# Patient Record
Sex: Female | Born: 1955 | Race: Black or African American | Hispanic: No | Marital: Married | Smoking: Former smoker
Health system: Southern US, Community
[De-identification: ages and names within clinical notes are randomized; demographics above are authoritative.]

## PROBLEM LIST (undated history)

## (undated) DIAGNOSIS — E7800 Pure hypercholesterolemia, unspecified: Secondary | ICD-10-CM

## (undated) DIAGNOSIS — M1990 Unspecified osteoarthritis, unspecified site: Secondary | ICD-10-CM

## (undated) DIAGNOSIS — E119 Type 2 diabetes mellitus without complications: Secondary | ICD-10-CM

## (undated) DIAGNOSIS — E039 Hypothyroidism, unspecified: Secondary | ICD-10-CM

## (undated) DIAGNOSIS — C801 Malignant (primary) neoplasm, unspecified: Secondary | ICD-10-CM

## (undated) DIAGNOSIS — I10 Essential (primary) hypertension: Secondary | ICD-10-CM

## (undated) DIAGNOSIS — R112 Nausea with vomiting, unspecified: Secondary | ICD-10-CM

## (undated) HISTORY — PX: DILATION AND CURETTAGE OF UTERUS: SHX78

## (undated) HISTORY — PX: OOPHORECTOMY: SHX86

---

## 1988-06-26 HISTORY — PX: DILATION AND CURETTAGE OF UTERUS: SHX78

## 1997-10-05 ENCOUNTER — Other Ambulatory Visit: Admission: RE | Admit: 1997-10-05 | Discharge: 1997-10-05 | Payer: Self-pay | Admitting: Internal Medicine

## 1997-10-12 ENCOUNTER — Other Ambulatory Visit: Admission: RE | Admit: 1997-10-12 | Discharge: 1997-10-12 | Payer: Self-pay | Admitting: Internal Medicine

## 1997-10-28 ENCOUNTER — Other Ambulatory Visit: Admission: RE | Admit: 1997-10-28 | Discharge: 1997-10-28 | Payer: Self-pay | Admitting: Obstetrics & Gynecology

## 1998-11-10 ENCOUNTER — Other Ambulatory Visit: Admission: RE | Admit: 1998-11-10 | Discharge: 1998-11-10 | Payer: Self-pay | Admitting: Obstetrics & Gynecology

## 1998-11-12 ENCOUNTER — Ambulatory Visit (HOSPITAL_COMMUNITY): Admission: RE | Admit: 1998-11-12 | Discharge: 1998-11-12 | Payer: Self-pay | Admitting: Internal Medicine

## 1998-11-12 ENCOUNTER — Encounter: Payer: Self-pay | Admitting: Internal Medicine

## 2000-02-20 ENCOUNTER — Other Ambulatory Visit: Admission: RE | Admit: 2000-02-20 | Discharge: 2000-02-20 | Payer: Self-pay | Admitting: Obstetrics & Gynecology

## 2000-12-11 ENCOUNTER — Encounter: Payer: Self-pay | Admitting: Internal Medicine

## 2000-12-11 ENCOUNTER — Ambulatory Visit (HOSPITAL_COMMUNITY): Admission: RE | Admit: 2000-12-11 | Discharge: 2000-12-11 | Payer: Self-pay | Admitting: Internal Medicine

## 2001-07-03 ENCOUNTER — Other Ambulatory Visit: Admission: RE | Admit: 2001-07-03 | Discharge: 2001-07-03 | Payer: Self-pay | Admitting: Obstetrics and Gynecology

## 2002-03-10 ENCOUNTER — Encounter: Payer: Self-pay | Admitting: Internal Medicine

## 2002-03-10 ENCOUNTER — Ambulatory Visit (HOSPITAL_COMMUNITY): Admission: RE | Admit: 2002-03-10 | Discharge: 2002-03-10 | Payer: Self-pay | Admitting: Internal Medicine

## 2002-03-18 ENCOUNTER — Encounter (INDEPENDENT_AMBULATORY_CARE_PROVIDER_SITE_OTHER): Payer: Self-pay | Admitting: Specialist

## 2002-03-18 ENCOUNTER — Encounter: Payer: Self-pay | Admitting: Internal Medicine

## 2002-03-18 ENCOUNTER — Ambulatory Visit (HOSPITAL_COMMUNITY): Admission: RE | Admit: 2002-03-18 | Discharge: 2002-03-18 | Payer: Self-pay | Admitting: Internal Medicine

## 2002-12-02 ENCOUNTER — Other Ambulatory Visit: Admission: RE | Admit: 2002-12-02 | Discharge: 2002-12-02 | Payer: Self-pay | Admitting: Obstetrics and Gynecology

## 2003-06-30 ENCOUNTER — Ambulatory Visit (HOSPITAL_COMMUNITY): Admission: RE | Admit: 2003-06-30 | Discharge: 2003-06-30 | Payer: Self-pay | Admitting: Internal Medicine

## 2003-12-24 ENCOUNTER — Other Ambulatory Visit: Admission: RE | Admit: 2003-12-24 | Discharge: 2003-12-24 | Payer: Self-pay | Admitting: Obstetrics & Gynecology

## 2004-06-26 HISTORY — PX: TOTAL THYROIDECTOMY: SHX2547

## 2005-01-11 ENCOUNTER — Encounter: Admission: RE | Admit: 2005-01-11 | Discharge: 2005-01-11 | Payer: Self-pay | Admitting: Internal Medicine

## 2005-01-16 ENCOUNTER — Other Ambulatory Visit: Admission: RE | Admit: 2005-01-16 | Discharge: 2005-01-16 | Payer: Self-pay | Admitting: Obstetrics & Gynecology

## 2005-04-21 ENCOUNTER — Ambulatory Visit (HOSPITAL_COMMUNITY): Admission: RE | Admit: 2005-04-21 | Discharge: 2005-04-22 | Payer: Self-pay | Admitting: Surgery

## 2005-04-21 ENCOUNTER — Encounter (INDEPENDENT_AMBULATORY_CARE_PROVIDER_SITE_OTHER): Payer: Self-pay | Admitting: *Deleted

## 2005-08-07 ENCOUNTER — Encounter (HOSPITAL_COMMUNITY): Admission: RE | Admit: 2005-08-07 | Discharge: 2005-11-05 | Payer: Self-pay | Admitting: Endocrinology

## 2006-02-22 ENCOUNTER — Encounter: Admission: RE | Admit: 2006-02-22 | Discharge: 2006-02-22 | Payer: Self-pay | Admitting: Obstetrics & Gynecology

## 2009-05-11 ENCOUNTER — Encounter: Admission: RE | Admit: 2009-05-11 | Discharge: 2009-05-11 | Payer: Self-pay | Admitting: Internal Medicine

## 2010-08-19 ENCOUNTER — Other Ambulatory Visit: Payer: Self-pay | Admitting: Internal Medicine

## 2010-08-19 DIAGNOSIS — C73 Malignant neoplasm of thyroid gland: Secondary | ICD-10-CM

## 2010-08-23 ENCOUNTER — Ambulatory Visit
Admission: RE | Admit: 2010-08-23 | Discharge: 2010-08-23 | Disposition: A | Discharge status: Home / Self Care | Payer: BC Managed Care – PPO | Source: Ambulatory Visit | Attending: Internal Medicine | Admitting: Internal Medicine

## 2010-08-23 DIAGNOSIS — C73 Malignant neoplasm of thyroid gland: Secondary | ICD-10-CM

## 2010-11-11 NOTE — Op Note (Signed)
NAMEANJU, Sara [name]             ACCOUNT NO.:  [id number]   MEDICAL RECORD NO.:  [id number]          PATIENT TYPE:  AMB   LOCATION:  DAY                          FACILITY:  [facility]   PHYSICIAN:  Velora Heckler, MD      DATE OF BIRTH:  04/11/1956   DATE OF PROCEDURE:  04/21/2005  DATE OF DISCHARGE:                                 OPERATIVE REPORT   PREOPERATIVE DIAGNOSES:  Multinodular goiter, enlarging follicular lesion,  right thyroid lobe.   POSTOPERATIVE DIAGNOSES:  Multinodular goiter, enlarging follicular lesion,  right thyroid lobe.   PROCEDURE:  Total thyroidectomy.   SURGEON:  Velora Heckler, MD   ASSISTANT:  Ollen Gross. Eudelia Bunch MD   ANESTHESIA:  General.   ESTIMATED BLOOD LOSS:  Minimal.   PREPARATION:  Betadine.   COMPLICATIONS:  None.   INDICATIONS:  Sara [name] is a pleasant 55 year old black female from  [place], [place], referred by Dr. Madison Hickman and Dr. Dorisann Frames, for evaluation for thyroidectomy.  The patient has had a longstanding  multinodular goiter.  She has been on thyroid hormone suppression.  She has  had interval enlargement of a follicular lesion in the anterior right lobe.  This has increased in size while on thyroid hormone suppression.  The  patient now comes to surgery for resection.   The procedure is done in OR number 11 at the [facility].  The patient is brought to the operating room, placed in a supine position on  the operating room table.  Following administration of general anesthesia,  the patient is prepped and draped in the usual strict aseptic fashion.  After ascertaining that Sara adequate level of anesthesia had been obtained, a  Kocher incision is made with a #15 blade.  Dissection was carried through  subcutaneous tissues and platysma.  Hemostasis is obtained with the  electrocautery.  Skin flaps are developed cephalad and caudad from the  thyroid notch to the sternal notch.  A Mahorner  self-retaining retractor is  placed for exposure.  Strap muscles are incised in the midline, and  dissection is begun on the left side.  Exposure of the isthmus reveals a  greater than 2 cm nodule.  This is actually present in the mid isthmus.  Left thyroid lobe is enlarged but without dominant mass.  It is gently  dissected out.  Venous tributaries are divided between small and medium  Ligaclips.  Superior pole vessels are dissected out, ligated in continuity  with 2-0 silk ties and medium Ligaclips, and divided.  Gland is rolled  anteriorly.  Branches of the inferior thyroid artery are divided between  small Ligaclips.  Recurrent nerve is identified and preserved.  Inferior  venous tributaries are divided between medium Ligaclips.  Gland is mobilized  anteriorly.  Ligament of Allyson Sabal is transected with the electrocautery, and  the gland is mobilized up and onto the anterior trachea.  Nodule in the  isthmus is carefully dissected away from the underlying trachea and thyroid  cartilage.  A dry pack is placed in the left neck.   Next we  turned our attention to the right lobe.  Again, strap muscles are  reflected laterally.  Small venous tributaries are divided between small  Ligaclips.  Superior pole vessels are dissected out, ligated in continuity  with 2-0 silk ties and medium Ligaclips and divided.  Gland is rolled  anteriorly.  Inferior venous tributaries are divided between medium  Ligaclips.  Branches of the inferior thyroid artery are divided between  small Ligaclips.  Superior parathyroid gland is identified and preserved.  Recurrent nerve is identified and preserved.  Ligament of Allyson Sabal is  transected with the electrocautery, and gland is rolled anteriorly and  excised completely off the anterior trachea.  Middle inferior thyroid veins  are ligated with 2-0 silk ties and divided.  The entire gland is excised.  It is submitted fresh to pathology for review.  Neck is irrigated with  warm  saline.  Good hemostasis is noted.  Surgicel was placed over the area of the  recurrent nerves and parathyroid glands bilaterally.  Strap muscles are  reapproximated in the midline with interrupted 3-0 Vicryl sutures.  Platysma  is closed with interrupted 3-0 Vicryl sutures.  Skin is closed with a  running 4-0 Vicryl subcuticular suture.  Wound is washed and dried, and  Benzoin and Steri-Strips are applied.  Sterile dressings are applied.  The  patient is awakened from anesthesia and brought to the recovery room in  stable condition.  The patient tolerated the procedure well.      Velora Heckler, MD  Electronically Signed     TMG/MEDQ  D:  04/21/2005  T:  04/21/2005  Job:  [id number]   cc:   Dorisann Frames, M.D.  Fax: [phone]   Darius Bump, M.D.  Fax: [phone]

## 2011-07-04 ENCOUNTER — Encounter (HOSPITAL_COMMUNITY): Payer: Self-pay | Admitting: General Practice

## 2011-07-04 ENCOUNTER — Inpatient Hospital Stay (HOSPITAL_COMMUNITY)
Admission: AD | Admit: 2011-07-04 | Discharge: 2011-07-08 | DRG: 551 | Disposition: A | Discharge status: Home / Self Care | Payer: BC Managed Care – PPO | Source: Ambulatory Visit | Attending: Family Medicine | Admitting: Family Medicine

## 2011-07-04 DIAGNOSIS — E039 Hypothyroidism, unspecified: Secondary | ICD-10-CM | POA: Diagnosis present

## 2011-07-04 DIAGNOSIS — K209 Esophagitis, unspecified without bleeding: Principal | ICD-10-CM | POA: Diagnosis present

## 2011-07-04 DIAGNOSIS — I10 Essential (primary) hypertension: Secondary | ICD-10-CM | POA: Diagnosis present

## 2011-07-04 DIAGNOSIS — N179 Acute kidney failure, unspecified: Secondary | ICD-10-CM | POA: Diagnosis present

## 2011-07-04 DIAGNOSIS — K5289 Other specified noninfective gastroenteritis and colitis: Secondary | ICD-10-CM | POA: Diagnosis present

## 2011-07-04 DIAGNOSIS — K529 Noninfective gastroenteritis and colitis, unspecified: Secondary | ICD-10-CM

## 2011-07-04 DIAGNOSIS — K449 Diaphragmatic hernia without obstruction or gangrene: Secondary | ICD-10-CM | POA: Diagnosis present

## 2011-07-04 HISTORY — DX: Essential (primary) hypertension: I10

## 2011-07-04 LAB — COMPREHENSIVE METABOLIC PANEL
BUN: 53 mg/dL — ABNORMAL HIGH (ref 6–23)
Calcium: 9.4 mg/dL (ref 8.4–10.5)
GFR calc Af Amer: 23 mL/min — ABNORMAL LOW (ref >90)
Glucose, Bld: 97 mg/dL (ref 70–99)
Sodium: 134 mEq/L — ABNORMAL LOW (ref 135–145)
Total Protein: 7.1 g/dL (ref 6.0–8.3)

## 2011-07-04 LAB — CBC
HCT: 40.2 % (ref 36.0–46.0)
Hemoglobin: 14 g/dL (ref 12.0–15.0)
MCH: 28.2 pg (ref 26.0–34.0)
MCHC: 34.8 g/dL (ref 30.0–36.0)
RDW: 14.5 % (ref 11.5–15.5)

## 2011-07-04 LAB — DIFFERENTIAL
Lymphocytes Relative: 45 % (ref 12–46)
Lymphs Abs: 4.9 10*3/uL — ABNORMAL HIGH (ref 0.7–4.0)
Neutro Abs: 4.6 10*3/uL (ref 1.7–7.7)
Neutrophils Relative %: 43 % (ref 43–77)

## 2011-07-04 LAB — TSH: TSH: 5.583 u[IU]/mL — ABNORMAL HIGH (ref 0.350–4.500)

## 2011-07-04 MED ORDER — ZOLPIDEM TARTRATE 5 MG PO TABS: 5.0000 mg | ORAL_TABLET | Freq: Every evening | ORAL | Status: DC | PRN

## 2011-07-04 MED ORDER — LEVOTHYROXINE SODIUM 175 MCG PO TABS
175.0000 ug | ORAL_TABLET | Freq: Every day | ORAL | Status: DC
  Administered 2011-07-06 – 2011-07-08 (×3): 175 ug via ORAL
  Filled 2011-07-04 (×5): qty 1

## 2011-07-04 MED ORDER — ACETAMINOPHEN 325 MG PO TABS: 650.0000 mg | ORAL_TABLET | Freq: Four times a day (QID) | ORAL | Status: DC | PRN

## 2011-07-04 MED ORDER — LISINOPRIL 40 MG PO TABS
40.0000 mg | ORAL_TABLET | Freq: Every day | ORAL | Status: DC
  Filled 2011-07-04: qty 1

## 2011-07-04 MED ORDER — SODIUM CHLORIDE 0.9 % IV SOLN
INTRAVENOUS | Status: DC
  Administered 2011-07-04 – 2011-07-07 (×6): via INTRAVENOUS

## 2011-07-04 MED ORDER — AMLODIPINE BESYLATE 10 MG PO TABS
10.0000 mg | ORAL_TABLET | Freq: Every day | ORAL | Status: DC
  Administered 2011-07-06 – 2011-07-08 (×3): 10 mg via ORAL
  Filled 2011-07-04 (×5): qty 1

## 2011-07-04 MED ORDER — ONDANSETRON HCL 4 MG/2ML IJ SOLN
4.0000 mg | Freq: Four times a day (QID) | INTRAMUSCULAR | Status: DC | PRN
  Administered 2011-07-05: 4 mg via INTRAVENOUS
  Filled 2011-07-04: qty 2

## 2011-07-04 MED ORDER — ACETAMINOPHEN 650 MG RE SUPP: 650.0000 mg | Freq: Four times a day (QID) | RECTAL | Status: DC | PRN

## 2011-07-04 MED ORDER — ONDANSETRON HCL 4 MG PO TABS: 4.0000 mg | ORAL_TABLET | Freq: Four times a day (QID) | ORAL | Status: DC | PRN

## 2011-07-04 MED ORDER — ATENOLOL 100 MG PO TABS
100.0000 mg | ORAL_TABLET | Freq: Every day | ORAL | Status: DC
  Administered 2011-07-06 – 2011-07-08 (×3): 100 mg via ORAL
  Filled 2011-07-04 (×5): qty 1

## 2011-07-04 MED ORDER — ENOXAPARIN SODIUM 40 MG/0.4ML ~~LOC~~ SOLN
40.0000 mg | SUBCUTANEOUS | Status: DC
  Administered 2011-07-04 – 2011-07-07 (×4): 40 mg via SUBCUTANEOUS
  Filled 2011-07-04 (×5): qty 0.4

## 2011-07-04 NOTE — H&P (Signed)
PATIENT DETAILS Name: CARLETA WOODROW Age: 56 y.o. Sex: female Date of Birth: 10-Feb-1956 Admit Date: 07/04/2011 VHQ:IONG,EXBMWU J, MD   CHIEF COMPLAINT:  Nausea vomiting and diarrhea  HPI: Patient is a 56 year old female with a history of hypertension and hypothyroidism who was doing well until New Year's Eve when she developed nausea vomiting and diarrhea. No other family members affected. The vomitus is not projectile. There is no hematemesis. She vomits food and bilious content. She has no hematochezia or melena. She has continued eating and drinking as tolerated. She denies greasy stools. She denies tenesmus. She admits to occasional crampy abdominal pain which is not severe. She denies malodorous stools. She denies fever or chills. Since the patient was not getting better she sought care at [facility] and was incidentally found with a creatinine of 2. Subsequently she was referred to our hospital for admission. The patient has no history of renal insufficiency. The patient had a colonoscopy in 2008 which was reportedly negative.   ALLERGIES:  No Known Allergies  PAST MEDICAL HISTORY: Hypertension Hypothyroidism Postmenopausal Obesity  PAST SURGICAL HISTORY: Thyroidectomy Ovarian cyst  MEDICATIONS AT HOME: Prior to Admission medications   Medication Sig Start Date End Date Taking? Authorizing Provider  amLODipine (NORVASC) 10 MG tablet Take 10 mg by mouth daily.     Yes Historical Provider, MD  atenolol (TENORMIN) 100 MG tablet Take 100 mg by mouth daily.     Yes Historical Provider, MD  Calcium Carbonate-Vitamin D (CALCIUM + D PO) Take 2 tablets by mouth daily.     Yes Historical Provider, MD  Cholecalciferol (VITAMIN D3) 2000 UNITS TABS Take 1 tablet by mouth daily.     Yes Historical Provider, MD  levothyroxine (SYNTHROID, LEVOTHROID) 175 MCG tablet Take 175 mcg by mouth daily.     Yes Historical Provider, MD  lisinopril (PRINIVIL,ZESTRIL) 40 MG tablet Take 40 mg by mouth  daily.     Yes Historical Provider, MD    FAMILY HISTORY: Denies family history of inflammatory bowel disease or GI malignancy. Positive family history for ischemic heart disease and strokes  SOCIAL HISTORY: High school counselor. Married. One grownup child. Denies tobacco, alcohol or illicits   REVIEW OF SYSTEMS:  Constitutional:   No  weight loss, night sweats,  Fevers, chills, fatigue.  HEENT:    No headaches, Difficulty swallowing,Tooth/dental problems,Sore throat,  No sneezing, itching, ear ache, nasal congestion, post nasal drip,   Cardio-vascular: No chest pain,  Orthopnea, PND, swelling in lower extremities, anasarca,         dizziness, palpitations  GI:  As in history of present illness   Resp: No shortness of breath with exertion or at rest.  No excess mucus, no productive cough, No non-productive cough,  No coughing up of blood.No change in color of mucus.No wheezing.No chest wall deformity  Skin:  no rash or lesions.  GU:  no dysuria, change in color of urine, no urgency or frequency.  No flank pain.  Musculoskeletal: No joint pain or swelling.  No decreased range of motion.  No back pain.   PHYSICAL EXAM: Blood pressure 111/69, pulse 58, temperature 97.4 F (36.3 C), temperature source Oral, resp. rate 20, height 5\' 10"  (1.778 m), weight 124.6 kg (274 lb 11.1 oz), SpO2 98.00%.  General appearance :Awake, alert, not in any distress. Speech Clear. Not toxic Looking HEENT: Atraumatic and Normocephalic, pupils equally reactive to light and accomodation Neck: supple, no JVD. No cervical lymphadenopathy.  Chest:Good air entry bilaterally, no  added sounds  CVS: S1 S2 regular, no murmurs.  Abdomen: Bowel sounds present, Non tender and not distended with no gaurding, rigidity or rebound. Extremities: B/L Lower Ext shows no edema, both legs are warm to touch, with  dorsalis pedis pulses palpable. Neurology: Awake alert, and oriented X 3, CN II-XII intact, Non focal,  Deep Tendon Reflex-2+ all over, plantar's downgoing B/L, sensory exam is grossly intact.  Skin:No Rash Wounds:N/A  LABS ON ADMISSION:  No results found for this basename: NA:2,K:2,CL:2,CO2:2,GLUCOSE:2,BUN:2,CREATININE:2,CALCIUM:2,MG:2,PHOS:2 in the last 72 hours No results found for this basename: AST:2,ALT:2,ALKPHOS:2,BILITOT:2,PROT:2,ALBUMIN:2 in the last 72 hours No results found for this basename: LIPASE:2,AMYLASE:2 in the last 72 hours No results found for this basename: WBC:2,NEUTROABS:2,HGB:2,HCT:2,MCV:2,PLT:2 in the last 72 hours No results found for this basename: CKTOTAL:3,CKMB:3,CKMBINDEX:3,TROPONINI:3 in the last 72 hours No results found for this basename: DDIMER:2 in the last 72 hours No components found with this basename: POCBNP:3   RADIOLOGIC STUDIES ON ADMISSION: No results found.  ASSESSMENT AND PLAN: Present on Admission:  .Gastroenteritis .Acute renal failure .Hypothyroidism .HTN (hypertension)  Admit patient to MedSurg Start IV fluids with normal saline at 125 cc an hour Stool studies including fecal lactoferrin, Hemoccult, ova and parasite, stool culture, fecal fat Obtain TSH, CBC with differential, CMP Urine sodium, urine creatinine, to assess her acute renal failure Abdominal ultrasound GI consult to consider sigmoidoscopy  GI consult requested from Dr. Loreta Ave   Further plan will depend as patient's clinical course evolves and further radiologic and laboratory data become available. Patient will be monitored closely.   DVT Prophylaxis: Lovenox  Code Status: Full  Total time spent for admission equals 45 minutes.  Jonny Ruiz 07/04/2011, 4:45 PM

## 2011-07-05 ENCOUNTER — Inpatient Hospital Stay (HOSPITAL_COMMUNITY): Payer: BC Managed Care – PPO

## 2011-07-05 LAB — CREATININE, URINE, 24 HOUR: Urine Total Volume-UCRE24: 1850 mL

## 2011-07-05 LAB — GLUCOSE, CAPILLARY: Glucose-Capillary: 186 mg/dL — ABNORMAL HIGH (ref 70–99)

## 2011-07-05 MED ORDER — PANTOPRAZOLE SODIUM 40 MG IV SOLR
40.0000 mg | Freq: Two times a day (BID) | INTRAVENOUS | Status: DC
  Administered 2011-07-05 – 2011-07-07 (×6): 40 mg via INTRAVENOUS
  Filled 2011-07-05 (×9): qty 40

## 2011-07-05 MED ORDER — PROMETHAZINE HCL 25 MG/ML IJ SOLN
12.5000 mg | Freq: Four times a day (QID) | INTRAMUSCULAR | Status: DC | PRN
  Administered 2011-07-05 (×2): 12.5 mg via INTRAVENOUS
  Filled 2011-07-05 (×2): qty 1

## 2011-07-05 NOTE — Consult Note (Signed)
Reason for Consult:Nausea/Vomiting and diarrhea Referring Physician: [facility]  [name] HPI: This is a 56 year old female with acute nausea, vomiting, and diarrhea x 1 week.  At that time she was eating dinner with her family and acutely she started to have her GI symptoms.  There is no history of sick contacts and her family members did not develop any GI symptoms.  At one point her symptoms did improve spontaneously, but then they worsened.  She sought care from [facility] and then she was instructed to be admitted to the hospital.  Her creatinine was noted to be increased.  She denies any abdominal pain, fevers, chills, hematochezia, melena, or hematemesis.  With her vomiting she does report the onset of GERD.  Prior to this time she did not have any GI complaints.  Her diarrhea stopped on Sunday after she took a couple of Imodium tablets.  Past Medical History  Diagnosis Date  . Hypertension   . Diabetes mellitus     ;" pre diabetic "    Past Surgical History  Procedure Date  . Thyroidectomy   . Ovarian cyst removal     History reviewed. No pertinent family history.  Social History:  reports that she has quit smoking. She has never used smokeless tobacco. She reports that she drinks alcohol. She reports that she does not use illicit drugs.  Allergies: No Known Allergies  Medications:  Scheduled:   . amLODipine  10 mg Oral Daily  . atenolol  100 mg Oral Daily  . enoxaparin  40 mg Subcutaneous Q24H  . levothyroxine  175 mcg Oral Daily  . DISCONTD: lisinopril  40 mg Oral Daily   Continuous:   . sodium chloride 150 mL/hr at 07/04/11 1823    Results for orders placed during the hospital encounter of 07/04/11 (from the past 24 hour(s))  CBC     Status: Abnormal   Collection Time   07/04/11  6:17 PM      Component Value Range   WBC 10.9 (*) 4.0 - 10.5 (K/uL)   RBC 4.97  3.87 - 5.11 (MIL/uL)   Hemoglobin 14.0  12.0 - 15.0 (g/dL)   HCT 40.3  47.4 - 25.9 (%)     MCV 80.9  78.0 - 100.0 (fL)   MCH 28.2  26.0 - 34.0 (pg)   MCHC 34.8  30.0 - 36.0 (g/dL)   RDW 56.3  87.5 - 64.3 (%)   Platelets 252  150 - 400 (K/uL)  COMPREHENSIVE METABOLIC PANEL     Status: Abnormal   Collection Time   07/04/11  6:17 PM      Component Value Range   Sodium 134 (*) 135 - 145 (mEq/L)   Potassium 3.5  3.5 - 5.1 (mEq/L)   Chloride 99  96 - 112 (mEq/L)   CO2 23  19 - 32 (mEq/L)   Glucose, Bld 97  70 - 99 (mg/dL)   BUN 53 (*) 6 - 23 (mg/dL)   Creatinine, Ser 3.29 (*) 0.50 - 1.10 (mg/dL)   Calcium 9.4  8.4 - 51.8 (mg/dL)   Total Protein 7.1  6.0 - 8.3 (g/dL)   Albumin 3.4 (*) 3.5 - 5.2 (g/dL)   AST 17  0 - 37 (U/L)   ALT 28  0 - 35 (U/L)   Alkaline Phosphatase 122 (*) 39 - 117 (U/L)   Total Bilirubin 0.2 (*) 0.3 - 1.2 (mg/dL)   GFR calc non Af Amer 20 (*) >90 (mL/min)  GFR calc Af Amer 23 (*) >90 (mL/min)  DIFFERENTIAL     Status: Abnormal   Collection Time   07/04/11  6:17 PM      Component Value Range   Neutrophils Relative 43  43 - 77 (%)   Neutro Abs 4.6  1.7 - 7.7 (K/uL)   Lymphocytes Relative 45  12 - 46 (%)   Lymphs Abs 4.9 (*) 0.7 - 4.0 (K/uL)   Monocytes Relative 11  3 - 12 (%)   Monocytes Absolute 1.2 (*) 0.1 - 1.0 (K/uL)   Eosinophils Relative 1  0 - 5 (%)   Eosinophils Absolute 0.1  0.0 - 0.7 (K/uL)   Basophils Relative 0  0 - 1 (%)   Basophils Absolute 0.0  0.0 - 0.1 (K/uL)  TSH     Status: Abnormal   Collection Time   07/04/11  6:17 PM      Component Value Range   TSH 5.583 (*) 0.350 - 4.500 (uIU/mL)     No results found.  ROS:  As stated above in the HPI otherwise negative.  Blood pressure 127/84, pulse 71, temperature 98.2 F (36.8 C), temperature source Oral, resp. rate 20, height 5\' 10"  (1.778 m), weight 124.6 kg (274 lb 11.1 oz), SpO2 95.00%.    PE: Gen: Acutely vomiting HEENT:  Connell/AT, EOMI Neck: Supple, no LAD Lungs: CTA Bilaterally CV: RRR without M/G/R ABM: Soft, NTND, +BS Ext: No C/C/E  Assessment/Plan: 1)  Nausea/Vomiting 2) Acute renal insufficiency 3) Dehydration   The etiology of her symptoms are not apparent at this time.  Her AP is minimally elevated, but no other abnormalities with her liver panel.  Her TSH is mildly elevated, but I do not believe the degree of hypothyrodism detected is enough to warrant her symptoms of nausea/vomiting.  Her diarrhea has stopped.  ? Viral etiology.  Plan: 1) Proceed with the abdominal ultrasound. 2) I will tentatively have her on the schedule for an EGD tomorrow. 3) No need for a FFS at this time as she has not had a BM in 72 hours. Sara [name] D 07/05/2011, 6:41 AM

## 2011-07-05 NOTE — Progress Notes (Signed)
Utilization review completed.  

## 2011-07-05 NOTE — Progress Notes (Signed)
[facility] progress note   Subjective: Doing fair.  Couldn't tolerate po intake today and vomited "even water"  No further n once kept NPO.  States has no abd pain and last BM was maybe about Sunday, after taking Imodium.  Had Hiccups this am which have resolved   Objective: Vital signs in last 24 hours: Temp:  [97.4 F (36.3 C)-98.4 F (36.9 C)] 98.2 F (36.8 C) (01/09 0515) Pulse Rate:  [58-71] 71  (01/09 0515) Resp:  [19-20] 20  (01/09 0515) BP: (91-127)/(59-84) 127/84 mmHg (01/09 0515) SpO2:  [95 %-98 %] 95 % (01/09 0515) Weight:  [124.6 kg (274 lb 11.1 oz)] 124.6 kg (274 lb 11.1 oz) (01/08 1431) Weight change:   Intake/Output Summary (Last 24 hours) at 07/05/11 0834 Last data filed at 07/05/11 0513  Gross per 24 hour  Intake  662.5 ml  Output    700 ml  Net  -37.5 ml    BP 139/76  Pulse 66  Temp(Src) 98.6 F (37 C) (Oral)  Resp 18  Ht 5\' 10"  (1.778 m)  Wt 124.6 kg (274 lb 11.1 oz)  BMI 39.41 kg/m2  SpO2 98% General appearance: alert, cooperative and Obese-BMI above 30 Throat: lips, mucosa, and tongue normal; teeth and gums normal Neck: no JVD and thyroid not enlarged, symmetric, no tenderness/mass/nodules Lungs: clear to auscultation bilaterally Abdomen: normal findings: bowel sounds normal, no organomegaly, no renal abnormalities palpable and soft, non-tender Pulses: 2+ and symmetric  Lab Results:  Basename 07/04/11 1817  NA 134*  K 3.5  CL 99  CO2 23  GLUCOSE 97  BUN 53*  CREATININE 2.54*  CALCIUM 9.4  MG --  PHOS --    Basename 07/04/11 1817  AST 17  ALT 28  ALKPHOS 122*  BILITOT 0.2*  PROT 7.1  ALBUMIN 3.4*   No results found for this basename: LIPASE:2,AMYLASE:2 in the last 72 hours  Basename 07/04/11 1817  WBC 10.9*  NEUTROABS 4.6  HGB 14.0  HCT 40.2  MCV 80.9  PLT 252   No results found for this basename: CKTOTAL:3,CKMB:3,CKMBINDEX:3,TROPONINI:3 in the last 72 hours No components found with this basename:  POCBNP:3 No results found for this basename: DDIMER:2 in the last 72 hours No results found for this basename: HGBA1C:2 in the last 72 hours No results found for this basename: CHOL:2,HDL:2,LDLCALC:2,TRIG:2,CHOLHDL:2,LDLDIRECT:2 in the last 72 hours  Basename 07/04/11 1817  TSH 5.583*  T4TOTAL --  T3FREE --  THYROIDAB --   No results found for this basename: VITAMINB12:2,FOLATE:2,FERRITIN:2,TIBC:2,IRON:2,RETICCTPCT:2 in the last 72 hours Micro Results:   Medications: I have reviewed the patient's current medications. Scheduled Meds:   . amLODipine  10 mg Oral Daily  . atenolol  100 mg Oral Daily  . enoxaparin  40 mg Subcutaneous Q24H  . levothyroxine  175 mcg Oral Daily  . DISCONTD: lisinopril  40 mg Oral Daily   Continuous Infusions:   . sodium chloride 150 mL/hr at 07/04/11 1823   PRN Meds:.acetaminophen, acetaminophen, ondansetron (ZOFRAN) IV, ondansetron, promethazine, zolpidem  Assessment/Plan:  Patient Active Problem List  Diagnoses  . Gastroenteritis-likely viral etiology-as no further diarrhoea, likely this has acutely resolved.  Still feels naseuous and will keep NPO till am and then can graduate diet up to clear Await GI doing EGD-appreciate input Will place on a ppi to see if any better  . Acute renal failure-continue ivf at 150cc/hr.  Will reassess with am labs.  . Hypothyroidism-slightly elevated TSH-h/o of thyroid resection for hypothyroid state.  Will likely  replace per PCP rec's once acute illness over  . HTN (hypertension)-continue amlodipine 10/atenolol 100      LOS: 1 day   [facility] 07/05/2011, 8:34 AM

## 2011-07-06 ENCOUNTER — Encounter (HOSPITAL_COMMUNITY): Payer: Self-pay | Admitting: Gastroenterology

## 2011-07-06 ENCOUNTER — Encounter (HOSPITAL_COMMUNITY)
Admission: AD | Disposition: A | Discharge status: Home / Self Care | Payer: Self-pay | Source: Ambulatory Visit | Attending: Family Medicine

## 2011-07-06 HISTORY — PX: ESOPHAGOGASTRODUODENOSCOPY: SHX5428

## 2011-07-06 LAB — GLUCOSE, CAPILLARY
Glucose-Capillary: 70 mg/dL (ref 70–99)
Glucose-Capillary: 117 mg/dL — ABNORMAL HIGH (ref 70–99)
Glucose-Capillary: 100 mg/dL — ABNORMAL HIGH (ref 70–99)
Glucose-Capillary: 91 mg/dL (ref 70–99)

## 2011-07-06 LAB — BASIC METABOLIC PANEL
Calcium: 9 mg/dL (ref 8.4–10.5)
Chloride: 107 mEq/L (ref 96–112)
Creatinine, Ser: 1.33 mg/dL — ABNORMAL HIGH (ref 0.50–1.10)
GFR calc Af Amer: 51 mL/min — ABNORMAL LOW (ref >90)
GFR calc non Af Amer: 44 mL/min — ABNORMAL LOW (ref >90)

## 2011-07-06 LAB — CBC
MCH: 27.3 pg (ref 26.0–34.0)
MCHC: 33.2 g/dL (ref 30.0–36.0)
MCV: 82.1 fL (ref 78.0–100.0)
Platelets: 256 10*3/uL (ref 150–400)
RDW: 14.8 % (ref 11.5–15.5)
WBC: 7 10*3/uL (ref 4.0–10.5)

## 2011-07-06 SURGERY — EGD (ESOPHAGOGASTRODUODENOSCOPY)
Anesthesia: Moderate Sedation

## 2011-07-06 MED ORDER — MIDAZOLAM HCL 10 MG/2ML IJ SOLN
INTRAMUSCULAR | Status: AC
  Filled 2011-07-06: qty 2

## 2011-07-06 MED ORDER — FENTANYL NICU IV SYRINGE 50 MCG/ML
INJECTION | INTRAMUSCULAR | Status: DC | PRN
  Administered 2011-07-06 (×3): 25 ug via INTRAVENOUS

## 2011-07-06 MED ORDER — FENTANYL CITRATE 0.05 MG/ML IJ SOLN
INTRAMUSCULAR | Status: AC
  Filled 2011-07-06: qty 2

## 2011-07-06 MED ORDER — MIDAZOLAM HCL 10 MG/2ML IJ SOLN
INTRAMUSCULAR | Status: DC | PRN
  Administered 2011-07-06 (×4): 2 mg via INTRAVENOUS

## 2011-07-06 MED ORDER — SUCRALFATE 1 GM/10ML PO SUSP
1.0000 g | Freq: Three times a day (TID) | ORAL | Status: DC
  Administered 2011-07-06 – 2011-07-08 (×8): 1 g via ORAL
  Filled 2011-07-06 (×11): qty 10

## 2011-07-06 NOTE — Op Note (Signed)
Eligha Bridegroom [facility] [address] [place], [place]  [address]  OPERATIVE PROCEDURE REPORT  PATIENT:  Sara, [name]  MR#:  [id number] BIRTHDATE:  Jun 20, 1956  GENDER:  female ENDOSCOPIST:  Jeani Hawking, MD PROCEDURE DATE:  07/06/2011 PROCEDURE:  EGD, diagnostic [phone] ASA CLASS:  Class II INDICATIONS:  Nausea/Vomiting MEDICATIONS:  Fentanyl 75 mcg IV, Versed 8 mg IV  DESCRIPTION OF PROCEDURE:   After the risks benefits and alternatives of the procedure were thoroughly explained, informed consent was obtained.  The Pentax Gastroscope [id number] endoscope was introduced through the mouth and advanced to the second portion of the duodenum, without limitations.  The instrument was slowly withdrawn as the mucosa was fully examined. <<PROCEDUREIMAGES>>  FINDINGS:  An LA Grade D esohpagitis was identified in the setting of a 3 cm hiatal hernia. No other abnormalities noted. Retroflexed views revealed no abnormalities.    The scope was then withdrawn from the patient and the procedure terminated.  COMPLICATIONS:  None  IMPRESSION:  1) Esophagitis 2) 3 cm hiatal hernia RECOMMENDATIONS:  1) Protonix BID x 1 month. 2) Sucralfate 1 gram QID. 3) Follow up in the office in one month.  ______________________________ Jeani Hawking, MD  CPT CODES:  [phone]  DIAGNOSIS CODES:  530.19, 553.3, 787.01  n. eSIGNEDMarland Kitchen   Jeani Hawking at 07/06/2011 01:42 PM  Angelina Ok, [phone]

## 2011-07-06 NOTE — Interval H&P Note (Signed)
History and Physical Interval Note:  07/06/2011 1:35 PM  Sara [name]  has presented today for surgery, with the diagnosis of Nausea/Vomiting  The various methods of treatment have been discussed with the patient and family. After consideration of risks, benefits and other options for treatment, the patient has consented to  Procedure(s): ESOPHAGOGASTRODUODENOSCOPY (EGD) as a surgical intervention .  The patients' history has been reviewed, patient examined, no change in status, stable for surgery.  I have reviewed the patients' chart and labs.  Questions were answered to the patient's satisfaction.     Llewellyn Choplin D

## 2011-07-06 NOTE — Progress Notes (Signed)
   CARE MANAGEMENT NOTE 07/06/2011  Patient:  Sara [name], Sara [name]   Account Number:  [id number]  Date Initiated:  07/06/2011  Documentation initiated by:  Donn Pierini  Subjective/Objective Assessment:   Pt admitted with gastroenteritis-     Action/Plan:   PTA pt lived at home with spouse, was independent with ADLs   Anticipated DC Date:  07/07/2011   Anticipated DC Plan:  HOME/SELF CARE      DC Planning Services  CM consult      Choice offered to / List presented to:             Status of service:  In process, will continue to follow Medicare Important Message given?   (If response is "NO", the following Medicare IM given date fields will be blank) Date Medicare IM given:   Date Additional Medicare IM given:    Discharge Disposition:    Per UR Regulation:    Comments:  PCP- Cammie Fulp  07/06/11- 1515- Donn Pierini RN, BSN [phone] Pt for EGD today, no anticipated d/c needs. CM to follow

## 2011-07-06 NOTE — H&P (View-Only) (Signed)
[facility]ITAL[facility] progress note   Subjective: Doing fair.  Still NPO About to go for her EGD   Objective: Vital signs in last 24 hours: Temp:  [97.7 F (36.5 C)-98.7 F (37.1 C)] 98.7 F (37.1 C) (01/10 1246) Pulse Rate:  [61-81] 61  (01/10 1000) Resp:  [16-19] 16  (01/10 1246) BP: (119-146)/(65-101) 146/101 mmHg (01/10 1246) SpO2:  [97 %-100 %] 100 % (01/10 1000) Weight:  [124.966 kg (275 lb 8 oz)] 124.966 kg (275 lb 8 oz) (01/10 0410) Weight change: 0.366 kg (12.9 oz)  Intake/Output Summary (Last 24 hours) at 07/06/11 1325 Last data filed at 07/06/11 0900  Gross per 24 hour  Intake   1800 ml  Output   1202 ml  Net    598 ml    BP 146/101  Pulse 61  Temp(Src) 98.7 F (37.1 C) (Oral)  Resp 16  Ht 5\' 10"  (1.778 m)  Wt 124.966 kg (275 lb 8 oz)  BMI 39.53 kg/m2  SpO2 100% General appearance: alert, cooperative and Obese-BMI above 30 Throat: lips, mucosa, and tongue normal; teeth and gums normal Neck: no JVD and thyroid not enlarged, symmetric, no tenderness/mass/nodules Lungs: clear to auscultation bilaterally Abdomen: normal findings: bowel sounds normal, no organomegaly, no renal abnormalities palpable and soft, non-tender Pulses: 2+ and symmetric  Lab Results:  [facility] 07/06/11 0610 07/04/11 1817  NA 138 134*  K 4.0 3.5  CL 107 99  CO2 23 23  GLUCOSE 129* 97  BUN 21 53*  CREATININE 1.33* 2.54*  CALCIUM 9.0 9.4  MG -- --  PHOS -- --    Basename 07/04/11 1817  AST 17  ALT 28  ALKPHOS 122*  BILITOT 0.2*  PROT 7.1  ALBUMIN 3.4*   No results found for this basename: LIPASE:2,AMYLASE:2 in the last 72 hours  Basename 07/06/11 0610 07/04/11 1817  WBC 7.0 10.9*  NEUTROABS -- 4.6  HGB 12.2 14.0  HCT 36.7 40.2  MCV 82.1 80.9  PLT 256 252   No results found for this basename: CKTOTAL:3,CKMB:3,CKMBINDEX:3,TROPONINI:3 in the last 72 hours No components found with this basename: POCBNP:3 No results found for this basename: DDIMER:2 in the last  72 hours No results found for this basename: HGBA1C:2 in the last 72 hours No results found for this basename: CHOL:2,HDL:2,LDLCALC:2,TRIG:2,CHOLHDL:2,LDLDIRECT:2 in the last 72 hours  Basename 07/04/11 1817  TSH 5.583*  T4TOTAL --  T3FREE --  THYROIDAB --   No results found for this basename: VITAMINB12:2,FOLATE:2,FERRITIN:2,TIBC:2,IRON:2,RETICCTPCT:2 in the last 72 hours Micro Results:   Medications: I have reviewed the patient's current medications. Scheduled Meds:    . amLODipine  10 mg Oral Daily  . atenolol  100 mg Oral Daily  . enoxaparin  40 mg Subcutaneous Q24H  . levothyroxine  175 mcg Oral Daily  . pantoprazole (PROTONIX) IV  40 mg Intravenous Q12H   Continuous Infusions:    . sodium chloride 150 mL/hr at 07/05/11 2059   PRN Meds:.acetaminophen, acetaminophen, ondansetron (ZOFRAN) IV, ondansetron, promethazine, zolpidem  Assessment/Plan:  Patient Active Problem List  Diagnoses  . Gastroenteritis-likely viral etiology-as no further diarrhoea, likely this has acutely resolved.  Still feels naseuous and will keep NPO till am and then can graduate diet up to clear-rec's per GI Await GI doing EGD-appreciate input Will place on a ppi to see if any better Would d/c stool work-up as no diarrhea  . Acute renal failure-continue ivf at 150cc/hr.  Will reassess with am labs. Creat dropped from 2.5-->1.33 Will wean down fluids and  hopefully can tolerate clears  . Hypothyroidism-slightly elevated TSH-h/o of thyroid resection for hypothyroid state.  Will likely replace per PCP rec's once acute illness over  . HTN (hypertension)-continue amlodipine 10/atenolol 100 Slightly elevated-monitor.      LOS: 2 days   Sara [name],JAI 07/06/2011, 1:25 PM

## 2011-07-06 NOTE — Progress Notes (Signed)
[facility]ITAL[facility] progress note   Subjective: Doing fair.  Still NPO About to go for her EGD   Objective: Vital signs in last 24 hours: Temp:  [97.7 F (36.5 C)-98.7 F (37.1 C)] 98.7 F (37.1 C) (01/10 1246) Pulse Rate:  [61-81] 61  (01/10 1000) Resp:  [16-19] 16  (01/10 1246) BP: (119-146)/(65-101) 146/101 mmHg (01/10 1246) SpO2:  [97 %-100 %] 100 % (01/10 1000) Weight:  [124.966 kg (275 lb 8 oz)] 124.966 kg (275 lb 8 oz) (01/10 0410) Weight change: 0.366 kg (12.9 oz)  Intake/Output Summary (Last 24 hours) at 07/06/11 1325 Last data filed at 07/06/11 0900  Gross per 24 hour  Intake   1800 ml  Output   1202 ml  Net    598 ml    BP 146/101  Pulse 61  Temp(Src) 98.7 F (37.1 C) (Oral)  Resp 16  Ht 5' 10" (1.778 m)  Wt 124.966 kg (275 lb 8 oz)  BMI 39.53 kg/m2  SpO2 100% General appearance: alert, cooperative and Obese-BMI above 30 Throat: lips, mucosa, and tongue normal; teeth and gums normal Neck: no JVD and thyroid not enlarged, symmetric, no tenderness/mass/nodules Lungs: clear to auscultation bilaterally Abdomen: normal findings: bowel sounds normal, no organomegaly, no renal abnormalities palpable and soft, non-tender Pulses: 2+ and symmetric  Lab Results:  Basename 07/06/11 0610 07/04/11 1817  NA 138 134*  K 4.0 3.5  CL 107 99  CO2 23 23  GLUCOSE 129* 97  BUN 21 53*  CREATININE 1.33* 2.54*  CALCIUM 9.0 9.4  MG -- --  PHOS -- --    Basename 07/04/11 1817  AST 17  ALT 28  ALKPHOS 122*  BILITOT 0.2*  PROT 7.1  ALBUMIN 3.4*   No results found for this basename: LIPASE:2,AMYLASE:2 in the last 72 hours  Basename 07/06/11 0610 07/04/11 1817  WBC 7.0 10.9*  NEUTROABS -- 4.6  HGB 12.2 14.0  HCT 36.7 40.2  MCV 82.1 80.9  PLT 256 252   No results found for this basename: CKTOTAL:3,CKMB:3,CKMBINDEX:3,TROPONINI:3 in the last 72 hours No components found with this basename: POCBNP:3 No results found for this basename: DDIMER:2 in the last  72 hours No results found for this basename: HGBA1C:2 in the last 72 hours No results found for this basename: CHOL:2,HDL:2,LDLCALC:2,TRIG:2,CHOLHDL:2,LDLDIRECT:2 in the last 72 hours  Basename 07/04/11 1817  TSH 5.583*  T4TOTAL --  T3FREE --  THYROIDAB --   No results found for this basename: VITAMINB12:2,FOLATE:2,FERRITIN:2,TIBC:2,IRON:2,RETICCTPCT:2 in the last 72 hours Micro Results:   Medications: I have reviewed the patient's current medications. Scheduled Meds:    . amLODipine  10 mg Oral Daily  . atenolol  100 mg Oral Daily  . enoxaparin  40 mg Subcutaneous Q24H  . levothyroxine  175 mcg Oral Daily  . pantoprazole (PROTONIX) IV  40 mg Intravenous Q12H   Continuous Infusions:    . sodium chloride 150 mL/hr at 07/05/11 2059   PRN Meds:.acetaminophen, acetaminophen, ondansetron (ZOFRAN) IV, ondansetron, promethazine, zolpidem  Assessment/Plan:  Patient Active Problem List  Diagnoses  . Gastroenteritis-likely viral etiology-as no further diarrhoea, likely this has acutely resolved.  Still feels naseuous and will keep NPO till am and then can graduate diet up to clear-rec's per GI Await GI doing EGD-appreciate input Will place on a ppi to see if any better Would d/c stool work-up as no diarrhea  . Acute renal failure-continue ivf at 150cc/hr.  Will reassess with am labs. Creat dropped from 2.5-->1.33 Will wean down fluids and   hopefully can tolerate clears  . Hypothyroidism-slightly elevated TSH-h/o of thyroid resection for hypothyroid state.  Will likely replace per PCP rec's once acute illness over  . HTN (hypertension)-continue amlodipine 10/atenolol 100 Slightly elevated-monitor.      LOS: 2 days   Roxie Kreeger,JAI 07/06/2011, 1:25 PM   

## 2011-07-07 ENCOUNTER — Encounter (HOSPITAL_COMMUNITY): Payer: Self-pay | Admitting: Gastroenterology

## 2011-07-07 LAB — BASIC METABOLIC PANEL
Chloride: 106 mEq/L (ref 96–112)
GFR calc Af Amer: 63 mL/min — ABNORMAL LOW (ref >90)
GFR calc non Af Amer: 54 mL/min — ABNORMAL LOW (ref >90)
Potassium: 3.5 mEq/L (ref 3.5–5.1)
Sodium: 137 mEq/L (ref 135–145)

## 2011-07-07 LAB — URINALYSIS, ROUTINE W REFLEX MICROSCOPIC
Ketones, ur: NEGATIVE mg/dL
Leukocytes, UA: NEGATIVE
Nitrite: NEGATIVE
Specific Gravity, Urine: 1.02 (ref 1.005–1.030)
pH: 5.5 (ref 5.0–8.0)

## 2011-07-07 NOTE — Progress Notes (Signed)
Patient ID: Sara [name], female   DOB: 02/29/1956, 56 y.o.   MRN: [id number] Subjective: The patient feels better.  She was able to tolerate a liquid diet without any problems.  Objective: Vital signs in last 24 hours: Temp:  [98 F (36.7 C)-98.9 F (37.2 C)] 98.4 F (36.9 C) (01/11 0420) Pulse Rate:  [58-69] 60  (01/11 0420) Resp:  [13-59] 20  (01/11 0420) BP: (102-161)/(68-101) 121/76 mmHg (01/11 0420) SpO2:  [98 %-100 %] 99 % (01/11 0420) Weight:  [125.1 kg (275 lb 12.7 oz)] 125.1 kg (275 lb 12.7 oz) (01/10 2137) Last BM Date: 07/02/11  Intake/Output from previous day: 01/10 0701 - 01/11 0700 In: 828.3 [P.O.:240; I.V.:588.3] Out: 2 [Urine:2] Intake/Output this shift:    General appearance: alert and no distress GI: soft, non-tender; bowel sounds normal; no masses,  no organomegaly  Lab Results:  Basename 07/06/11 0610 07/04/11 1817  WBC 7.0 10.9*  HGB 12.2 14.0  HCT 36.7 40.2  PLT 256 252   BMET  Basename 07/07/11 0520 07/06/11 0610 07/04/11 1817  NA 137 138 134*  K 3.5 4.0 3.5  CL 106 107 99  CO2 23 23 23   GLUCOSE 92 129* 97  BUN 13 21 53*  CREATININE 1.12* 1.33* 2.54*  CALCIUM 8.6 9.0 9.4   LFT  Basename 07/04/11 1817  PROT 7.1  ALBUMIN 3.4*  AST 17  ALT 28  ALKPHOS 122*  BILITOT 0.2*  BILIDIR --  IBILI --   PT/INR No results found for this basename: LABPROT:2,INR:2 in the last 72 hours Hepatitis Panel No results found for this basename: HEPBSAG,HCVAB,HEPAIGM,HEPBIGM in the last 72 hours C-Diff No results found for this basename: CDIFFTOX:3 in the last 72 hours Fecal Lactopherrin No results found for this basename: FECLLACTOFRN in the last 72 hours  Studies/Results: US Abdomen Complete  07/05/2011  *RADIOLOGY REPORT*  Clinical Data:  Abdominal pain.  Nausea.  History of diabetes and hypertension.  COMPLETE ABDOMINAL ULTRASOUND 07/05/2011:  Comparison:  None.  Findings:  Gallbladder:  Multiple shadowing gallstones on the order of 10-12 mm  in size.  No gallbladder wall thickening or pericholecystic fluid.  Negative sonographic Murphy's sign according to the ultrasound technologist.  Common bile duct:  Normal in caliber with maximum diameter approximating 3 mm.  Liver:  Normal size and parenchymal echotexture.  Approximate 0.8 cm cyst in the right lobe.  No significant focal parenchymal abnormality.  Patent portal vein with hepatopetal flow.  IVC:  Patent.  Pancreas:  Although the pancreas is difficult to visualize in its entirety, no focal pancreatic abnormality is identified.  Spleen:  Normal size and echotexture without focal parenchymal abnormality.  Right Kidney:  No hydronephrosis.  Well-preserved cortex.  No shadowing calculi.  Normal size and parenchymal echotexture without focal abnormalities.  Approximately 10.5 cm in length.  Left Kidney:  No hydronephrosis.  Well-preserved cortex.  No shadowing calculi.  Normal size and parenchymal echotexture without focal abnormalities.  Approximately 10.0 cm in length.  Abdominal aorta:  Normal in caliber throughout its visualized course in the abdomen without significant atherosclerosis.  IMPRESSION:  1.  Cholelithiasis.  No sonographic evidence of acute cholecystitis. 2.  Sub-centimeter cyst in the right lobe of the liver. 3.  Otherwise normal examination.  Original Report Authenticated By: Arnell Sieving, M.D.    Medications:  Scheduled:   . amLODipine  10 mg Oral Daily  . atenolol  100 mg Oral Daily  . enoxaparin  40 mg Subcutaneous Q24H  . levothyroxine  175 mcg Oral Daily  . pantoprazole (PROTONIX) IV  40 mg Intravenous Q12H  . sucralfate  1 g Oral TID WC & HS   Continuous:   . sodium chloride 50 mL/hr at 07/06/11 2000    Assessment/Plan: 1) LA Grade D esophagitis. 2) Hiatal hernia.   The patient is much better clinically.  Provided that she can tolerate a PO diet she can be discharged home.  Plan: 1) Protonix 40 mg BID or any PPI BID. 2) Sucralfate 1 gram QID. 3)  Follow up in the office in one month.  LOS: 3 days   Conchetta Lamia D 07/07/2011, 7:21 AM

## 2011-07-07 NOTE — Progress Notes (Signed)
[facility]ITAL[facility] progress note   Subjective: Doing fair.  Tolerated some PO well. No N/V/CP.  Passing urine and stool   Objective: Vital signs in last 24 hours: Temp:  [98 F (36.7 C)-98.9 F (37.2 C)] 98.8 F (37.1 C) (01/11 0957) Pulse Rate:  [58-69] 66  (01/11 0957) Resp:  [18-20] 19  (01/11 0957) BP: (102-121)/(68-76) 108/69 mmHg (01/11 0957) SpO2:  [98 %-99 %] 98 % (01/11 0957) Weight:  [125.1 kg (275 lb 12.7 oz)] 125.1 kg (275 lb 12.7 oz) (01/10 2137) Weight change: 0.134 kg (4.7 oz)  Intake/Output Summary (Last 24 hours) at 07/07/11 1638 Last data filed at 07/07/11 0916  Gross per 24 hour  Intake 1068.33 ml  Output      0 ml  Net 1068.33 ml    BP 108/69  Pulse 66  Temp(Src) 98.8 F (37.1 C) (Oral)  Resp 19  Ht 5\' 10"  (1.778 m)  Wt 125.1 kg (275 lb 12.7 oz)  BMI 39.57 kg/m2  SpO2 98% General appearance: alert, cooperative and Obese-BMI above 30 Throat: lips, mucosa, and tongue normal; teeth and gums normal Neck: no JVD and thyroid not enlarged, symmetric, no tenderness/mass/nodules Lungs: clear to auscultation bilaterally Abdomen: normal findings: bowel sounds normal, no organomegaly, no renal abnormalities palpable and soft, non-tender Pulses: 2+ and symmetric  Lab Results:  [facility] 07/07/11 0520 07/06/11 0610  NA 137 138  K 3.5 4.0  CL 106 107  CO2 23 23  GLUCOSE 92 129*  BUN 13 21  CREATININE 1.12* 1.33*  CALCIUM 8.6 9.0  MG -- --  PHOS -- --    Basename 07/04/11 1817  AST 17  ALT 28  ALKPHOS 122*  BILITOT 0.2*  PROT 7.1  ALBUMIN 3.4*   No results found for this basename: LIPASE:2,AMYLASE:2 in the last 72 hours  Basename 07/06/11 0610 07/04/11 1817  WBC 7.0 10.9*  NEUTROABS -- 4.6  HGB 12.2 14.0  HCT 36.7 40.2  MCV 82.1 80.9  PLT 256 252   No results found for this basename: CKTOTAL:3,CKMB:3,CKMBINDEX:3,TROPONINI:3 in the last 72 hours No components found with this basename: POCBNP:3 No results found for this basename:  DDIMER:2 in the last 72 hours No results found for this basename: HGBA1C:2 in the last 72 hours No results found for this basename: CHOL:2,HDL:2,LDLCALC:2,TRIG:2,CHOLHDL:2,LDLDIRECT:2 in the last 72 hours  Basename 07/04/11 1817  TSH 5.583*  T4TOTAL --  T3FREE --  THYROIDAB --   No results found for this basename: VITAMINB12:2,FOLATE:2,FERRITIN:2,TIBC:2,IRON:2,RETICCTPCT:2 in the last 72 hours   EGB 1) LA Grade D esophagitis.  2) Hiatal hernia.    Medications: I have reviewed the patient's current medications. Scheduled Meds:    . amLODipine  10 mg Oral Daily  . atenolol  100 mg Oral Daily  . enoxaparin  40 mg Subcutaneous Q24H  . levothyroxine  175 mcg Oral Daily  . pantoprazole (PROTONIX) IV  40 mg Intravenous Q12H  . sucralfate  1 g Oral TID WC & HS   Continuous Infusions:    . sodium chloride 50 mL/hr at 07/07/11 1556   PRN Meds:.acetaminophen, acetaminophen, ondansetron (ZOFRAN) IV, ondansetron, promethazine, zolpidem  Assessment/Plan:  Patient Active Problem List  Diagnoses  . Esophagitis- Per GI-appreciate input Will place on a ppi to see if any better Would d/c stool work-up as no diarrhea  . Acute renal failure-continue ivf at 150cc/hr.  Will reassess with am labs. Creat dropped from 2.5-->1.33-->1.12 Will wean off fluids   . Hypothyroidism-slightly elevated TSH-h/o of thyroid resection for  hypothyroid state.  Will likely replace per PCP rec's once acute illness over  . HTN (hypertension)-continue amlodipine 10/atenolol 100 Slightly elevated-monitor.      LOS: 3 days   [facility] 07/07/2011, 4:38 PM

## 2011-07-08 MED ORDER — SUCRALFATE 1 GM/10ML PO SUSP: 1.0000 g | Freq: Three times a day (TID) | ORAL | Status: DC

## 2011-07-08 MED ORDER — PANTOPRAZOLE SODIUM 40 MG PO TBEC: 40.0000 mg | DELAYED_RELEASE_TABLET | Freq: Every day | ORAL | Status: DC

## 2011-07-08 NOTE — Discharge Summary (Signed)
Physician Discharge Summary  Patient ID: Sara [name] MRN: [id number] DOB/AGE: 1956/03/28 56 y.o.  Admit date: 07/04/2011 Discharge date: 07/08/2011  Admission Diagnoses: Nausea and vomiting and loose stool  Discharge Diagnoses:  Principal Problem:  *Gastroenteritis Active Problems:  Acute renal failure  Hypothyroidism  HTN (hypertension)   Discharged Condition: good  Hospital Course:  56 year old female with a history of hypertension and hypothyroidism who was doing well until New Year's Eve when she developed nausea vomiting and diarrhea. No other family members affected. The vomitus is not projectile. There is no hematemesis. She vomits food and bilious content. She has no hematochezia or melena. She has continued eating and drinking as tolerated. - greasy stools. - tenesmus. + occasional crampy abdominal pain which is not severe. The patient had a colonoscopy in 2008 which was reportedly negative. - malodorous stools. -  fever or chills.  found with a creatinine of 2. Subsequently she was referred to our hospital for admission. The patient has no history of renal insufficiency.   Assessment/Plan:  Patient Active Problem List   Diagnoses   .  Esophagitis-  Per GI-appreciate input  Better on ppi/Carafate-diet graduated over course of hospitalization, and was able to take full PO  Noted patient occasionally takes Naprocen which was discontinued Would d/c stool work-up as no diarrhea/stool since admit, there fore the diagnosis of Gastroenteritis was not entertained as her primary diagnosis. She had LA grade D esophagitis and a hitals hernia on Endoscope performed 07/07/11, and GI Dr. Elnoria Howard has recommended she follow in 1 mo in his office.  .  Acute renal failure-Creatinine dropped during hospitalization, could have been from dehydration Creat dropped from 2.5-->1.33-->1.12  She was taken off IVf as was tolerating PO well and will need Rpt BMEtn in 3-7 days   .   Hypothyroidism-slightly elevated TSH-h/o of thyroid resection for hypothyroid state. Will likely replace per PCP rec's once acute illness over   .  HTN (hypertension)-continue amlodipine 10/atenolol 100  Her Lisnopril was held as this in addition to NSAIDS might have contirbuted to AKI Would re-implement after bmet done as out-pt    Consults: GI  Significant Diagnostic Studies: labs: BMET and endoscopy: gastroscopy: 07/04/10 Grade 2 Gastritis and Hiatal hernia  Treatments: IV hydration and procedures: Endoscopy  Discharge Exam: Blood pressure 121/80, pulse 63, temperature 98.5 F (36.9 C), temperature source Oral, resp. rate 18, height 5\' 10"  (1.778 m), weight 125.1 kg (275 lb 12.7 oz), SpO2 96.00%. Obese pleasant AAF, NAD s1 s2 no m/r//g abd soft, nt,nd, no rebound neuro intact  Disposition:  Follow-up Appointments: Discharge Orders    Future Orders Please Complete By Expires   Diet - low sodium heart healthy      Increase activity slowly      Call MD for:  persistant nausea and vomiting      Call MD for:  severe uncontrolled pain      Call MD for:  difficulty breathing, headache or visual disturbances      Call MD for:  persistant dizziness or light-headedness         Discharge Medications: Current Discharge Medication List    START taking these medications   Details  pantoprazole (PROTONIX) 40 MG tablet Take 1 tablet (40 mg total) by mouth daily. Qty: 60 tablet, Refills: 1    sucralfate (CARAFATE) 1 GM/10ML suspension Take 10 mLs (1 g total) by mouth 4 (four) times daily -  with meals and at bedtime. Qty: 420 mL, Refills: 1  CONTINUE these medications which have NOT CHANGED   Details  amLODipine (NORVASC) 10 MG tablet Take 10 mg by mouth daily.      atenolol (TENORMIN) 100 MG tablet Take 100 mg by mouth daily.      Calcium Carbonate-Vitamin D (CALCIUM + D PO) Take 2 tablets by mouth daily.      Cholecalciferol (VITAMIN D3) 2000 UNITS TABS Take 1 tablet by  mouth daily.      levothyroxine (SYNTHROID, LEVOTHROID) 175 MCG tablet Take 175 mcg by mouth daily.        STOP taking these medications     lisinopril (PRINIVIL,ZESTRIL) 40 MG tablet      naproxen sodium (ANAPROX) 220 MG tablet        Follow-up Information    Follow up with FULP,CAMMIE J.   Contact information:   [address], [place] [address] [place] [address] [phone]       Follow up with Theda Belfast, MD. Call in 1 week. (call for follow up appt)    Contact information:   [address] [place] [address] [phone]         Needs BMET as out-patient  Signed: Pleas Koch 07/08/2011, 9:11 AM

## 2011-07-08 NOTE — Progress Notes (Signed)
Patient discharge home, discharge instructions given and explained to patient.  Patient voiced understanding of all instruction as explained by Nurse-RN.  Copies of all forms given and explained.  Patient escorted to car per Nursing assistant.

## 2011-07-10 NOTE — Progress Notes (Signed)
   CARE MANAGEMENT NOTE 07/10/2011  Patient:  EVALUNA, UTKE   Account Number:  [id number]  Date Initiated:  07/06/2011  Documentation initiated by:  Donn Pierini  Subjective/Objective Assessment:   Pt admitted with gastroenteritis-     Action/Plan:   PTA pt lived at home with spouse, was independent with ADLs   Anticipated DC Date:  07/07/2011   Anticipated DC Plan:  HOME/SELF CARE      DC Planning Services  CM consult      Choice offered to / List presented to:             Status of service:  Completed, signed off Medicare Important Message given?   (If response is "NO", the following Medicare IM given date fields will be blank) Date Medicare IM given:   Date Additional Medicare IM given:    Discharge Disposition:  HOME/SELF CARE  Per UR Regulation:    Comments:  PCP- Cammie Fulp  07/06/11- 1515- Donn Pierini RN, BSN [phone] Pt for EGD today, no anticipated d/c needs. CM to follow

## 2011-10-12 ENCOUNTER — Ambulatory Visit: Payer: BC Managed Care – PPO

## 2011-11-14 ENCOUNTER — Other Ambulatory Visit: Payer: Self-pay | Admitting: Internal Medicine

## 2011-11-14 DIAGNOSIS — C73 Malignant neoplasm of thyroid gland: Secondary | ICD-10-CM

## 2011-11-24 ENCOUNTER — Ambulatory Visit
Admission: RE | Admit: 2011-11-24 | Discharge: 2011-11-24 | Disposition: A | Discharge status: Home / Self Care | Payer: BC Managed Care – PPO | Source: Ambulatory Visit | Attending: Internal Medicine | Admitting: Internal Medicine

## 2011-11-24 DIAGNOSIS — C73 Malignant neoplasm of thyroid gland: Secondary | ICD-10-CM

## 2012-11-27 ENCOUNTER — Other Ambulatory Visit: Payer: Self-pay | Admitting: Internal Medicine

## 2012-11-27 DIAGNOSIS — C73 Malignant neoplasm of thyroid gland: Secondary | ICD-10-CM

## 2012-12-09 ENCOUNTER — Other Ambulatory Visit: Payer: BC Managed Care – PPO

## 2012-12-12 ENCOUNTER — Ambulatory Visit
Admission: RE | Admit: 2012-12-12 | Discharge: 2012-12-12 | Disposition: A | Discharge status: Home / Self Care | Payer: BC Managed Care – PPO | Source: Ambulatory Visit | Attending: Internal Medicine | Admitting: Internal Medicine

## 2012-12-12 DIAGNOSIS — C73 Malignant neoplasm of thyroid gland: Secondary | ICD-10-CM

## 2013-12-24 ENCOUNTER — Other Ambulatory Visit: Payer: Self-pay | Admitting: Internal Medicine

## 2013-12-24 DIAGNOSIS — C73 Malignant neoplasm of thyroid gland: Secondary | ICD-10-CM

## 2013-12-30 ENCOUNTER — Ambulatory Visit
Admission: RE | Admit: 2013-12-30 | Discharge: 2013-12-30 | Disposition: A | Discharge status: Home / Self Care | Payer: BC Managed Care – PPO | Source: Ambulatory Visit | Attending: Internal Medicine | Admitting: Internal Medicine

## 2013-12-30 DIAGNOSIS — C73 Malignant neoplasm of thyroid gland: Secondary | ICD-10-CM

## 2014-05-13 ENCOUNTER — Other Ambulatory Visit: Payer: Self-pay | Admitting: Obstetrics and Gynecology

## 2014-05-14 LAB — CYTOLOGY - PAP

## 2015-05-26 ENCOUNTER — Other Ambulatory Visit: Payer: Self-pay | Admitting: Obstetrics & Gynecology

## 2015-05-26 DIAGNOSIS — R928 Other abnormal and inconclusive findings on diagnostic imaging of breast: Secondary | ICD-10-CM

## 2015-06-07 ENCOUNTER — Ambulatory Visit
Admission: RE | Admit: 2015-06-07 | Discharge: 2015-06-07 | Disposition: A | Discharge status: Home / Self Care | Payer: BC Managed Care – PPO | Source: Ambulatory Visit | Attending: Obstetrics & Gynecology | Admitting: Obstetrics & Gynecology

## 2015-06-07 DIAGNOSIS — R928 Other abnormal and inconclusive findings on diagnostic imaging of breast: Secondary | ICD-10-CM

## 2015-07-26 ENCOUNTER — Encounter (HOSPITAL_COMMUNITY): Payer: Self-pay | Admitting: Emergency Medicine

## 2015-07-26 ENCOUNTER — Observation Stay (HOSPITAL_COMMUNITY)
Admission: EM | Admit: 2015-07-26 | Discharge: 2015-07-28 | Disposition: A | Discharge status: Home / Self Care | Payer: BC Managed Care – PPO | Attending: Family Medicine | Admitting: Family Medicine

## 2015-07-26 ENCOUNTER — Encounter (HOSPITAL_COMMUNITY): Payer: Self-pay | Admitting: *Deleted

## 2015-07-26 ENCOUNTER — Observation Stay (HOSPITAL_COMMUNITY): Payer: BC Managed Care – PPO

## 2015-07-26 ENCOUNTER — Emergency Department (HOSPITAL_COMMUNITY)
Admission: EM | Admit: 2015-07-26 | Discharge: 2015-07-26 | Disposition: A | Discharge status: Other Acute Inpatient Hospital | Payer: BC Managed Care – PPO | Source: Home / Self Care | Attending: Family Medicine | Admitting: Family Medicine

## 2015-07-26 DIAGNOSIS — R066 Hiccough: Secondary | ICD-10-CM | POA: Diagnosis not present

## 2015-07-26 DIAGNOSIS — E119 Type 2 diabetes mellitus without complications: Secondary | ICD-10-CM | POA: Diagnosis not present

## 2015-07-26 DIAGNOSIS — E876 Hypokalemia: Secondary | ICD-10-CM | POA: Insufficient documentation

## 2015-07-26 DIAGNOSIS — Z87891 Personal history of nicotine dependence: Secondary | ICD-10-CM | POA: Diagnosis not present

## 2015-07-26 DIAGNOSIS — D72829 Elevated white blood cell count, unspecified: Secondary | ICD-10-CM | POA: Insufficient documentation

## 2015-07-26 DIAGNOSIS — E039 Hypothyroidism, unspecified: Secondary | ICD-10-CM | POA: Diagnosis not present

## 2015-07-26 DIAGNOSIS — Z7984 Long term (current) use of oral hypoglycemic drugs: Secondary | ICD-10-CM | POA: Diagnosis not present

## 2015-07-26 DIAGNOSIS — A0811 Acute gastroenteropathy due to Norwalk agent: Secondary | ICD-10-CM | POA: Diagnosis not present

## 2015-07-26 DIAGNOSIS — K529 Noninfective gastroenteritis and colitis, unspecified: Principal | ICD-10-CM | POA: Insufficient documentation

## 2015-07-26 DIAGNOSIS — I10 Essential (primary) hypertension: Secondary | ICD-10-CM | POA: Insufficient documentation

## 2015-07-26 DIAGNOSIS — F1210 Cannabis abuse, uncomplicated: Secondary | ICD-10-CM | POA: Diagnosis not present

## 2015-07-26 DIAGNOSIS — E860 Dehydration: Secondary | ICD-10-CM | POA: Insufficient documentation

## 2015-07-26 DIAGNOSIS — E785 Hyperlipidemia, unspecified: Secondary | ICD-10-CM | POA: Insufficient documentation

## 2015-07-26 LAB — URINALYSIS, ROUTINE W REFLEX MICROSCOPIC
BILIRUBIN URINE: NEGATIVE
Glucose, UA: 500 mg/dL — AB
Ketones, ur: 40 mg/dL — AB
Leukocytes, UA: NEGATIVE
Nitrite: NEGATIVE
PH: 6 (ref 5.0–8.0)
Protein, ur: 100 mg/dL — AB
SPECIFIC GRAVITY, URINE: 1.015 (ref 1.005–1.030)

## 2015-07-26 LAB — POCT I-STAT, CHEM 8
BUN: 10 mg/dL (ref 6–20)
Calcium, Ion: 1.11 mmol/L — ABNORMAL LOW (ref 1.12–1.23)
Chloride: 100 mmol/L — ABNORMAL LOW (ref 101–111)
Creatinine, Ser: 0.6 mg/dL (ref 0.44–1.00)
Glucose, Bld: 181 mg/dL — ABNORMAL HIGH (ref 65–99)
HEMATOCRIT: 49 % — AB (ref 36.0–46.0)
Hemoglobin: 16.7 g/dL — ABNORMAL HIGH (ref 12.0–15.0)
Potassium: 3.9 mmol/L (ref 3.5–5.1)
SODIUM: 137 mmol/L (ref 135–145)
TCO2: 26 mmol/L (ref 0–100)

## 2015-07-26 LAB — COMPREHENSIVE METABOLIC PANEL
ALBUMIN: 4.6 g/dL (ref 3.5–5.0)
ALK PHOS: 109 U/L (ref 38–126)
ALT: 18 U/L (ref 14–54)
AST: 23 U/L (ref 15–41)
Anion gap: 17 — ABNORMAL HIGH (ref 5–15)
BUN: 7 mg/dL (ref 6–20)
CALCIUM: 9.9 mg/dL (ref 8.9–10.3)
CHLORIDE: 99 mmol/L — AB (ref 101–111)
CO2: 23 mmol/L (ref 22–32)
CREATININE: 0.8 mg/dL (ref 0.44–1.00)
GFR calc Af Amer: >60 mL/min (ref >60)
GFR calc non Af Amer: >60 mL/min (ref >60)
GLUCOSE: 175 mg/dL — AB (ref 65–99)
Potassium: 3.4 mmol/L — ABNORMAL LOW (ref 3.5–5.1)
SODIUM: 139 mmol/L (ref 135–145)
Total Bilirubin: 0.5 mg/dL (ref 0.3–1.2)
Total Protein: 9 g/dL — ABNORMAL HIGH (ref 6.5–8.1)

## 2015-07-26 LAB — CBC WITH DIFFERENTIAL/PLATELET
BASOS ABS: 0 10*3/uL (ref 0.0–0.1)
BASOS PCT: 0 %
EOS ABS: 0 10*3/uL (ref 0.0–0.7)
Eosinophils Relative: 0 %
HCT: 44.4 % (ref 36.0–46.0)
HEMOGLOBIN: 14.8 g/dL (ref 12.0–15.0)
LYMPHS PCT: 9 %
Lymphs Abs: 1.3 10*3/uL (ref 0.7–4.0)
MCH: 27.1 pg (ref 26.0–34.0)
MCHC: 33.3 g/dL (ref 30.0–36.0)
MCV: 81.2 fL (ref 78.0–100.0)
Monocytes Absolute: 0.3 10*3/uL (ref 0.1–1.0)
Monocytes Relative: 2 %
NEUTROS PCT: 89 %
Neutro Abs: 13 10*3/uL — ABNORMAL HIGH (ref 1.7–7.7)
PLATELETS: 286 10*3/uL (ref 150–400)
RBC: 5.47 MIL/uL — AB (ref 3.87–5.11)
RDW: 15.4 % (ref 11.5–15.5)
WBC: 14.6 10*3/uL — AB (ref 4.0–10.5)

## 2015-07-26 LAB — URINE MICROSCOPIC-ADD ON

## 2015-07-26 LAB — LIPASE, BLOOD: Lipase: 36 U/L (ref 11–51)

## 2015-07-26 MED ORDER — SODIUM CHLORIDE 0.9 % IV BOLUS (SEPSIS)
1000.0000 mL | Freq: Once | INTRAVENOUS | Status: AC
  Administered 2015-07-27: 1000 mL via INTRAVENOUS

## 2015-07-26 MED ORDER — ONDANSETRON 4 MG PO TBDP
ORAL_TABLET | ORAL | Status: AC
  Filled 2015-07-26: qty 1

## 2015-07-26 MED ORDER — ONDANSETRON 4 MG PO TBDP
4.0000 mg | ORAL_TABLET | Freq: Once | ORAL | Status: AC
  Administered 2015-07-26: 4 mg via ORAL

## 2015-07-26 MED ORDER — ONDANSETRON HCL 4 MG/2ML IJ SOLN
4.0000 mg | Freq: Once | INTRAMUSCULAR | Status: AC
  Administered 2015-07-26: 4 mg via INTRAVENOUS
  Filled 2015-07-26: qty 2

## 2015-07-26 MED ORDER — SODIUM CHLORIDE 0.9 % IV BOLUS (SEPSIS)
1000.0000 mL | Freq: Once | INTRAVENOUS | Status: AC
  Administered 2015-07-26: 1000 mL via INTRAVENOUS

## 2015-07-26 MED ORDER — DIPHENHYDRAMINE HCL 50 MG/ML IJ SOLN
25.0000 mg | Freq: Once | INTRAMUSCULAR | Status: AC
  Administered 2015-07-26: 25 mg via INTRAVENOUS
  Filled 2015-07-26: qty 1

## 2015-07-26 MED ORDER — SODIUM CHLORIDE 0.9% FLUSH
INTRAVENOUS | Status: AC
  Filled 2015-07-26: qty 10

## 2015-07-26 MED ORDER — METOCLOPRAMIDE HCL 5 MG/ML IJ SOLN
10.0000 mg | Freq: Once | INTRAMUSCULAR | Status: AC
  Administered 2015-07-26: 10 mg via INTRAVENOUS
  Filled 2015-07-26: qty 2

## 2015-07-26 NOTE — ED Notes (Addendum)
Dr. Darl Householder made aware pt vomited after drinking and eating a cracker and he went back in to speak with pt.

## 2015-07-26 NOTE — ED Notes (Signed)
Pt sent here for [facility] for N/V/D x 1 day with body aches

## 2015-07-26 NOTE — ED Notes (Signed)
Pt  Reports   Symptoms  Of  Nausea  Vomiting  Diarrhea       Chills  Fever       Symptoms  Started  This  Am        Vomited  X  6   Today  With    vdiarrhea  X  6  Today  Pt  Reports  body Is  Cramping

## 2015-07-26 NOTE — ED Notes (Signed)
Pt  Was feeling  Better  She  Was  Given  A  Fluid  Challenge  Felt  Better  Then  Started  Vomiting  Again

## 2015-07-26 NOTE — ED Provider Notes (Addendum)
CSN: [id number]     Arrival date & time 07/26/15  1338 History   First MD Initiated Contact with Patient 07/26/15 1504     Chief Complaint  Patient presents with  . Emesis   (Consider location/radiation/quality/duration/timing/severity/associated sxs/prior Treatment) Patient is a 60 y.o. female presenting with vomiting. The history is provided by the patient.  Emesis Severity:  Moderate Duration:  8 hours Timing:  Intermittent Number of daily episodes:  6 Quality:  Stomach contents Feeding tolerance: not able to tol meds or water. Progression:  Unchanged Chronicity:  New Recent urination:  Normal Relieved by:  None tried Worsened by:  Nothing tried Ineffective treatments:  None tried Associated symptoms: chills, diarrhea and myalgias   Risk factors: diabetes   Risk factors comment:  Sx similar to last episode requiring hosp for dehydration   Past Medical History  Diagnosis Date  . Hypertension   . Diabetes mellitus     ;" pre diabetic "   Past Surgical History  Procedure Laterality Date  . Thyroidectomy    . Ovarian cyst removal    . Esophagogastroduodenoscopy  07/06/2011    Procedure: ESOPHAGOGASTRODUODENOSCOPY (EGD);  Surgeon: Beryle Beams, MD;  Location: [facility] ENDOSCOPY;  Service: Endoscopy;  Laterality: N/A;   History reviewed. No pertinent family history. Social History  Substance Use Topics  . Smoking status: Former Research scientist (life sciences)  . Smokeless tobacco: Never Used     Comment: quit 1991  . Alcohol Use: Yes     Comment: occasional   OB History    No data available     Review of Systems  Constitutional: Positive for chills, activity change and appetite change.  Cardiovascular: Negative.   Gastrointestinal: Positive for nausea, vomiting and diarrhea. Negative for blood in stool.  Musculoskeletal: Positive for myalgias.    Allergies  Review of patient's allergies indicates no known allergies.  Home Medications   Prior to Admission medications   Medication Sig  Start Date End Date Taking? Authorizing Provider  furosemide (LASIX) 40 MG tablet Take 40 mg by mouth.   Yes Historical Provider, MD  lisinopril (PRINIVIL,ZESTRIL) 20 MG tablet Take 20 mg by mouth daily.   Yes Historical Provider, MD  metFORMIN (GLUMETZA) 500 MG (MOD) 24 hr tablet Take 500 mg by mouth daily with breakfast.   Yes Historical Provider, MD  simvastatin (ZOCOR) 20 MG tablet Take 20 mg by mouth daily.   Yes Historical Provider, MD  sitaGLIPtin (JANUVIA) 100 MG tablet Take 100 mg by mouth daily.   Yes Historical Provider, MD  amLODipine (NORVASC) 10 MG tablet Take 10 mg by mouth daily.      Historical Provider, MD  atenolol (TENORMIN) 100 MG tablet Take 100 mg by mouth daily.      Historical Provider, MD  Calcium Carbonate-Vitamin D (CALCIUM + D PO) Take 2 tablets by mouth daily.      Historical Provider, MD  Cholecalciferol (VITAMIN D3) 2000 UNITS TABS Take 1 tablet by mouth daily.      Historical Provider, MD  levothyroxine (SYNTHROID, LEVOTHROID) 175 MCG tablet Take 175 mcg by mouth daily.      Historical Provider, MD  pantoprazole (PROTONIX) 40 MG tablet Take 1 tablet (40 mg total) by mouth daily. 07/08/11 07/07/12  Nita Sells, MD   Meds Ordered and Administered this Visit   Medications  sodium chloride 0.9 % bolus 1,000 mL (1,000 mLs Intravenous Given 07/26/15 1610)  ondansetron (ZOFRAN-ODT) disintegrating tablet 4 mg (4 mg Oral Given 07/26/15 1604)  BP 178/86 mmHg  Pulse 64  Temp(Src) 98.7 F (37.1 C) (Oral)  Resp 16  SpO2 100% No data found.   Physical Exam  Constitutional: She is oriented to person, place, and time. She appears well-developed and well-nourished. She appears distressed.  Neck: Normal range of motion. Neck supple.  Abdominal: Soft. Bowel sounds are normal. She exhibits no mass. There is no tenderness. There is no rebound and no guarding.  Lymphadenopathy:    She has no cervical adenopathy.  Neurological: She is alert and oriented to person,  place, and time.  Skin: Skin is warm.  Nursing note and vitals reviewed.   ED Course  Procedures (including critical care time)  Labs Review Labs Reviewed  POCT I-STAT, CHEM 8 - Abnormal; Notable for the following:    Chloride 100 (*)    Glucose, Bld 181 (*)    Calcium, Ion 1.11 (*)    Hemoglobin 16.7 (*)    HCT 49.0 (*)    All other components within normal limits    Imaging Review No results found.   Visual Acuity Review  Right Eye Distance:   Left Eye Distance:   Bilateral Distance:    Right Eye Near:   Left Eye Near:    Bilateral Near:         MDM  Sent for further meds for v/d. Given 1 liter ns and vomited with po challenge  Meds ordered this encounter  Medications  . metFORMIN (GLUMETZA) 500 MG (MOD) 24 hr tablet    Sig: Take 500 mg by mouth daily with breakfast.  . furosemide (LASIX) 40 MG tablet    Sig: Take 40 mg by mouth.  . sitaGLIPtin (JANUVIA) 100 MG tablet    Sig: Take 100 mg by mouth daily.  . simvastatin (ZOCOR) 20 MG tablet    Sig: Take 20 mg by mouth daily.  Marland Kitchen lisinopril (PRINIVIL,ZESTRIL) 20 MG tablet    Sig: Take 20 mg by mouth daily.  . sodium chloride 0.9 % bolus 1,000 mL    Sig:   . ondansetron (ZOFRAN-ODT) disintegrating tablet 4 mg    Sig:       Billy Fischer, MD 07/26/15 1746  Billy Fischer, MD 07/26/15 [phone]

## 2015-07-26 NOTE — ED Notes (Signed)
Dr. Darl Householder back in to speak with patient.

## 2015-07-26 NOTE — ED Notes (Signed)
Patient transported to X-ray 

## 2015-07-26 NOTE — ED Notes (Signed)
Iv  Capped  Off   To  A  Saline     Iv

## 2015-07-26 NOTE — ED Provider Notes (Signed)
CSN: [id number]     Arrival date & time 07/26/15  1814 History   First MD Initiated Contact with Patient 07/26/15 2108     Chief Complaint  Patient presents with  . Emesis  . Diarrhea     (Consider location/radiation/quality/duration/timing/severity/associated sxs/prior Treatment) The history is provided by the patient.  Sara [name] is a 60 y.o. female hx of HTN, DM here with vomiting.  She had about 7 episodes of vomiting today along with 6 or 7 episodes diarrhea.  Denies any abdominal pain.  Has any previous abdominal surgeries.  Has some subjective chills but no fever.  meds any urinary symptoms.  She went to urgent care and had 1 L NS and phenergan and felt better but then didn't tolerate PO trial so was sent here.    Past Medical History  Diagnosis Date  . Hypertension   . Diabetes mellitus     ;" pre diabetic "   Past Surgical History  Procedure Laterality Date  . Thyroidectomy    . Ovarian cyst removal    . Esophagogastroduodenoscopy  07/06/2011    Procedure: ESOPHAGOGASTRODUODENOSCOPY (EGD);  Surgeon: Beryle Beams, MD;  Location: [facility] ENDOSCOPY;  Service: Endoscopy;  Laterality: N/A;   History reviewed. No pertinent family history. Social History  Substance Use Topics  . Smoking status: Former Research scientist (life sciences)  . Smokeless tobacco: Never Used     Comment: quit 1991  . Alcohol Use: Yes     Comment: occasional   OB History    No data available     Review of Systems  Gastrointestinal: Positive for vomiting and diarrhea.  All other systems reviewed and are negative.     Allergies  Review of patient's allergies indicates no known allergies.  Home Medications   Prior to Admission medications   Medication Sig Start Date End Date Taking? Authorizing Provider  amLODipine (NORVASC) 10 MG tablet Take 10 mg by mouth daily.     Yes Historical Provider, MD  atenolol (TENORMIN) 100 MG tablet Take 100 mg by mouth daily.     Yes Historical Provider, MD  Calcium  Carbonate-Vitamin D (CALCIUM + D PO) Take 2 tablets by mouth daily.     Yes Historical Provider, MD  Cholecalciferol (VITAMIN D3) 2000 UNITS TABS Take 1 tablet by mouth daily.     Yes Historical Provider, MD  furosemide (LASIX) 40 MG tablet Take 40 mg by mouth.   Yes Historical Provider, MD  levothyroxine (SYNTHROID, LEVOTHROID) 125 MCG tablet Take 125 mcg by mouth daily before breakfast.   Yes Historical Provider, MD  lisinopril (PRINIVIL,ZESTRIL) 20 MG tablet Take 20 mg by mouth daily.   Yes Historical Provider, MD  metFORMIN (GLUMETZA) 500 MG (MOD) 24 hr tablet Take 500 mg by mouth daily with breakfast.   Yes Historical Provider, MD  simvastatin (ZOCOR) 20 MG tablet Take 20 mg by mouth daily.   Yes Historical Provider, MD  sitaGLIPtin (JANUVIA) 100 MG tablet Take 100 mg by mouth daily.   Yes Historical Provider, MD  pantoprazole (PROTONIX) 40 MG tablet Take 1 tablet (40 mg total) by mouth daily. 07/08/11 07/07/12  Nita Sells, MD   BP 177/96 mmHg  Pulse 100  Temp(Src) 98.2 F (36.8 C) (Oral)  Resp 18  SpO2 98% Physical Exam  Constitutional: She is oriented to person, place, and time.  Dehydrated   HENT:  Head: Normocephalic.  MM dry   Eyes: Conjunctivae are normal. Pupils are equal, round, and reactive to light.  Neck:  Normal range of motion. Neck supple.  Cardiovascular: Normal rate, regular rhythm and normal heart sounds.   Pulmonary/Chest: Effort normal and breath sounds normal. No respiratory distress. She has no wheezes. She has no rales.  Abdominal: Soft. Bowel sounds are normal. She exhibits no distension. There is no tenderness. There is no rebound.  Musculoskeletal: Normal range of motion. She exhibits no edema or tenderness.  Neurological: She is alert and oriented to person, place, and time. No cranial nerve deficit. Coordination normal.  Skin: Skin is warm and dry.  Psychiatric: She has a normal mood and affect. Her behavior is normal. Judgment and thought content  normal.  Nursing note and vitals reviewed.   ED Course  Procedures (including critical care time) Labs Review Labs Reviewed  CBC WITH DIFFERENTIAL/PLATELET - Abnormal; Notable for the following:    WBC 14.6 (*)    RBC 5.47 (*)    Neutro Abs 13.0 (*)    All other components within normal limits  COMPREHENSIVE METABOLIC PANEL - Abnormal; Notable for the following:    Potassium 3.4 (*)    Chloride 99 (*)    Glucose, Bld 175 (*)    Total Protein 9.0 (*)    Anion gap 17 (*)    All other components within normal limits  LIPASE, BLOOD  URINALYSIS, ROUTINE W REFLEX MICROSCOPIC (NOT AT [facility])  URINE RAPID DRUG SCREEN, HOSP PERFORMED    Imaging Review No results found. I have personally reviewed and evaluated these images and lab results as part of my medical decision-making.   EKG Interpretation None      MDM   Final diagnoses:  None    Sara [name] is a 60 y.o. female here with vomiting, diarrhea. Likely gastro. Will check labs and hydrate and reassess.   11:31 PM Given 1 L NS and zofran and reglan and felt better. Given PO trial but unable to tolerate. Will admit for dehydration.     Wandra Arthurs, MD 07/26/15 [phone]

## 2015-07-27 ENCOUNTER — Encounter (HOSPITAL_COMMUNITY): Payer: Self-pay | Admitting: Internal Medicine

## 2015-07-27 DIAGNOSIS — I10 Essential (primary) hypertension: Secondary | ICD-10-CM | POA: Diagnosis not present

## 2015-07-27 DIAGNOSIS — R197 Diarrhea, unspecified: Secondary | ICD-10-CM | POA: Diagnosis not present

## 2015-07-27 DIAGNOSIS — R112 Nausea with vomiting, unspecified: Secondary | ICD-10-CM

## 2015-07-27 DIAGNOSIS — K529 Noninfective gastroenteritis and colitis, unspecified: Secondary | ICD-10-CM | POA: Diagnosis not present

## 2015-07-27 LAB — COMPREHENSIVE METABOLIC PANEL
ALT: 18 U/L (ref 14–54)
AST: 26 U/L (ref 15–41)
Albumin: 3.8 g/dL (ref 3.5–5.0)
Alkaline Phosphatase: 93 U/L (ref 38–126)
Anion gap: 10 (ref 5–15)
BILIRUBIN TOTAL: 0.5 mg/dL (ref 0.3–1.2)
BUN: 7 mg/dL (ref 6–20)
CALCIUM: 9.5 mg/dL (ref 8.9–10.3)
CO2: 28 mmol/L (ref 22–32)
CREATININE: 0.94 mg/dL (ref 0.44–1.00)
Chloride: 100 mmol/L — ABNORMAL LOW (ref 101–111)
GFR calc Af Amer: >60 mL/min (ref >60)
Glucose, Bld: 164 mg/dL — ABNORMAL HIGH (ref 65–99)
POTASSIUM: 4.1 mmol/L (ref 3.5–5.1)
Sodium: 138 mmol/L (ref 135–145)
TOTAL PROTEIN: 7.6 g/dL (ref 6.5–8.1)

## 2015-07-27 LAB — GLUCOSE, CAPILLARY
GLUCOSE-CAPILLARY: 168 mg/dL — AB (ref 65–99)
Glucose-Capillary: 168 mg/dL — ABNORMAL HIGH (ref 65–99)
Glucose-Capillary: 170 mg/dL — ABNORMAL HIGH (ref 65–99)
Glucose-Capillary: 171 mg/dL — ABNORMAL HIGH (ref 65–99)
Glucose-Capillary: 163 mg/dL — ABNORMAL HIGH (ref 65–99)

## 2015-07-27 LAB — RAPID URINE DRUG SCREEN, HOSP PERFORMED
Amphetamines: NOT DETECTED
BARBITURATES: NOT DETECTED
Benzodiazepines: NOT DETECTED
Cocaine: NOT DETECTED
OPIATES: NOT DETECTED
TETRAHYDROCANNABINOL: POSITIVE — AB

## 2015-07-27 MED ORDER — POTASSIUM CHLORIDE 10 MEQ/100ML IV SOLN
10.0000 meq | INTRAVENOUS | Status: AC
  Administered 2015-07-27 (×2): 10 meq via INTRAVENOUS
  Filled 2015-07-27 (×2): qty 100

## 2015-07-27 MED ORDER — VITAMIN D 1000 UNITS PO TABS
2000.0000 [IU] | ORAL_TABLET | Freq: Every day | ORAL | Status: DC
  Administered 2015-07-27 – 2015-07-28 (×2): 2000 [IU] via ORAL
  Filled 2015-07-27 (×2): qty 2

## 2015-07-27 MED ORDER — ONDANSETRON HCL 4 MG PO TABS: 4.0000 mg | ORAL_TABLET | Freq: Four times a day (QID) | ORAL | Status: DC | PRN

## 2015-07-27 MED ORDER — BACLOFEN 10 MG PO TABS
10.0000 mg | ORAL_TABLET | Freq: Three times a day (TID) | ORAL | Status: DC
  Administered 2015-07-27 – 2015-07-28 (×3): 10 mg via ORAL
  Filled 2015-07-27 (×3): qty 1

## 2015-07-27 MED ORDER — LINAGLIPTIN 5 MG PO TABS
5.0000 mg | ORAL_TABLET | Freq: Every day | ORAL | Status: DC
  Administered 2015-07-27 – 2015-07-28 (×2): 5 mg via ORAL
  Filled 2015-07-27 (×2): qty 1

## 2015-07-27 MED ORDER — ACETAMINOPHEN 325 MG PO TABS: 650.0000 mg | ORAL_TABLET | Freq: Four times a day (QID) | ORAL | Status: DC | PRN

## 2015-07-27 MED ORDER — LISINOPRIL 20 MG PO TABS
20.0000 mg | ORAL_TABLET | Freq: Every day | ORAL | Status: DC
  Administered 2015-07-27 – 2015-07-28 (×2): 20 mg via ORAL
  Filled 2015-07-27 (×2): qty 1

## 2015-07-27 MED ORDER — SODIUM CHLORIDE 0.9 % IV SOLN
INTRAVENOUS | Status: DC
  Administered 2015-07-27 (×2): via INTRAVENOUS
  Filled 2015-07-27: qty 1000

## 2015-07-27 MED ORDER — LEVOTHYROXINE SODIUM 125 MCG PO TABS
125.0000 ug | ORAL_TABLET | Freq: Every day | ORAL | Status: DC
  Administered 2015-07-27 – 2015-07-28 (×2): 125 ug via ORAL
  Filled 2015-07-27 (×2): qty 1

## 2015-07-27 MED ORDER — SODIUM CHLORIDE 0.9 % IV SOLN
INTRAVENOUS | Status: DC
  Administered 2015-07-27: 06:00:00 via INTRAVENOUS

## 2015-07-27 MED ORDER — SODIUM CHLORIDE 0.9 % IV SOLN
INTRAVENOUS | Status: DC
  Administered 2015-07-27: 01:00:00 via INTRAVENOUS

## 2015-07-27 MED ORDER — SIMVASTATIN 20 MG PO TABS
20.0000 mg | ORAL_TABLET | Freq: Every day | ORAL | Status: DC
  Administered 2015-07-27 – 2015-07-28 (×2): 20 mg via ORAL
  Filled 2015-07-27 (×2): qty 1

## 2015-07-27 MED ORDER — ACETAMINOPHEN 650 MG RE SUPP: 650.0000 mg | Freq: Four times a day (QID) | RECTAL | Status: DC | PRN

## 2015-07-27 MED ORDER — ATENOLOL 50 MG PO TABS
100.0000 mg | ORAL_TABLET | Freq: Every day | ORAL | Status: DC
  Administered 2015-07-27 – 2015-07-28 (×2): 100 mg via ORAL
  Filled 2015-07-27 (×2): qty 2

## 2015-07-27 MED ORDER — AMLODIPINE BESYLATE 10 MG PO TABS
10.0000 mg | ORAL_TABLET | Freq: Every day | ORAL | Status: DC
  Administered 2015-07-27 – 2015-07-28 (×2): 10 mg via ORAL
  Filled 2015-07-27 (×2): qty 1

## 2015-07-27 MED ORDER — ENOXAPARIN SODIUM 60 MG/0.6ML ~~LOC~~ SOLN
60.0000 mg | SUBCUTANEOUS | Status: DC
  Administered 2015-07-27: 60 mg via SUBCUTANEOUS
  Filled 2015-07-27: qty 0.6

## 2015-07-27 MED ORDER — INSULIN ASPART 100 UNIT/ML ~~LOC~~ SOLN
0.0000 [IU] | Freq: Three times a day (TID) | SUBCUTANEOUS | Status: DC
  Administered 2015-07-27 – 2015-07-28 (×4): 2 [IU] via SUBCUTANEOUS

## 2015-07-27 MED ORDER — PANTOPRAZOLE SODIUM 40 MG PO TBEC
40.0000 mg | DELAYED_RELEASE_TABLET | Freq: Every day | ORAL | Status: DC
  Administered 2015-07-27 – 2015-07-28 (×2): 40 mg via ORAL
  Filled 2015-07-27 (×2): qty 1

## 2015-07-27 MED ORDER — ONDANSETRON HCL 4 MG/2ML IJ SOLN
4.0000 mg | Freq: Four times a day (QID) | INTRAMUSCULAR | Status: DC | PRN
  Administered 2015-07-27: 4 mg via INTRAVENOUS
  Filled 2015-07-27: qty 2

## 2015-07-27 MED ORDER — HYDRALAZINE HCL 20 MG/ML IJ SOLN
10.0000 mg | INTRAMUSCULAR | Status: DC | PRN
  Administered 2015-07-27: 10 mg via INTRAVENOUS
  Filled 2015-07-27: qty 1

## 2015-07-27 NOTE — Progress Notes (Signed)
Attempted report. Left number for ED RN to call this RN back.   Shelbie Hutching, RN, BSN

## 2015-07-27 NOTE — H&P (Signed)
[facility] History and Physical  Sara [name] [id number] DOB: May 17, 1956 DOA: 07/26/2015  Referring physician: Dr. Darl Householder. PCP: Antony Blackbird, MD  Specialists: NONE.  Chief Complaint: Nausea vomiting and diarrhea.  HPI: Sara [name] is a 60 y.o. female history of diabetes mellitus type 2, hypertension, hyperlipidemia, hypothyroidism presents to the ER because of multiple episodes of nausea vomiting and diarrhea. Patient states her symptoms started yesterday morning. Denies any abdominal pain. Denies any sick contacts or recent travel. The patient still had persistent nausea and has been admitted for further management. On exam patient's abdomen appears benign.   Review of Systems: As presented in the history of presenting illness, rest negative.  Past Medical History  Diagnosis Date  . Hypertension   . Diabetes mellitus     ;" pre diabetic "   Past Surgical History  Procedure Laterality Date  . Thyroidectomy    . Ovarian cyst removal    . Esophagogastroduodenoscopy  07/06/2011    Procedure: ESOPHAGOGASTRODUODENOSCOPY (EGD);  Surgeon: Beryle Beams, MD;  Location: [facility] ENDOSCOPY;  Service: Endoscopy;  Laterality: N/A;   Social History:  reports that she has quit smoking. She has never used smokeless tobacco. She reports that she drinks alcohol. She reports that she does not use illicit drugs. Where does patient live at home. Can patient participate in ADLs? Yes.  No Known Allergies  Family History:  Family History  Problem Relation Age of Onset  . Diabetes Mellitus II Mother       Prior to Admission medications   Medication Sig Start Date End Date Taking? Authorizing Provider  amLODipine (NORVASC) 10 MG tablet Take 10 mg by mouth daily.     Yes Historical Provider, MD  atenolol (TENORMIN) 100 MG tablet Take 100 mg by mouth daily.     Yes Historical Provider, MD  Calcium Carbonate-Vitamin D (CALCIUM + D PO) Take 2 tablets by mouth daily.     Yes Historical  Provider, MD  Cholecalciferol (VITAMIN D3) 2000 UNITS TABS Take 1 tablet by mouth daily.     Yes Historical Provider, MD  furosemide (LASIX) 40 MG tablet Take 40 mg by mouth.   Yes Historical Provider, MD  levothyroxine (SYNTHROID, LEVOTHROID) 125 MCG tablet Take 125 mcg by mouth daily before breakfast.   Yes Historical Provider, MD  lisinopril (PRINIVIL,ZESTRIL) 20 MG tablet Take 20 mg by mouth daily.   Yes Historical Provider, MD  metFORMIN (GLUMETZA) 500 MG (MOD) 24 hr tablet Take 500 mg by mouth daily with breakfast.   Yes Historical Provider, MD  simvastatin (ZOCOR) 20 MG tablet Take 20 mg by mouth daily.   Yes Historical Provider, MD  sitaGLIPtin (JANUVIA) 100 MG tablet Take 100 mg by mouth daily.   Yes Historical Provider, MD  pantoprazole (PROTONIX) 40 MG tablet Take 1 tablet (40 mg total) by mouth daily. 07/08/11 07/07/12  Nita Sells, MD    Physical Exam: Filed Vitals:   07/26/15 2308 07/26/15 2330 07/27/15 0026 07/27/15 0048  BP: 177/96 179/100 153/84 170/75  Pulse: 100 90 90 93  Temp:    98.5 F (36.9 C)  TempSrc:    Oral  Resp:   18 19  Height:    5\' 9"  (1.753 m)  Weight:    124.286 kg (274 lb)  SpO2: 98% 96% 97% 98%     General:  Moderately built and nourished.  Eyes: Anicteric. No pallor.  ENT: No discharge from the ears eyes nose or mouth.  Neck: No mass felt.  Cardiovascular: S1 and S2 heard.  Respiratory: No rhonchi or crepitations.  Abdomen: Soft nontender bowel sounds present.  Skin: No rash.  Musculoskeletal: No edema.  Psychiatric: Appears normal.  Neurologic: Alert awake oriented to time place and person. Moves all extremities.  Labs on Admission:  Basic Metabolic Panel:  Recent Labs Lab 07/26/15 1538 07/26/15 2144  NA 137 139  K 3.9 3.4*  CL 100* 99*  CO2  --  23  GLUCOSE 181* 175*  BUN 10 7  CREATININE 0.60 0.80  CALCIUM  --  9.9   Liver Function Tests:  Recent Labs Lab 07/26/15 2144  AST 23  ALT 18  ALKPHOS 109   BILITOT 0.5  PROT 9.0*  ALBUMIN 4.6    Recent Labs Lab 07/26/15 2144  LIPASE 36   No results for input(s): AMMONIA in the last 168 hours. CBC:  Recent Labs Lab 07/26/15 1538 07/26/15 2144  WBC  --  14.6*  NEUTROABS  --  13.0*  HGB 16.7* 14.8  HCT 49.0* 44.4  MCV  --  81.2  PLT  --  286   Cardiac Enzymes: No results for input(s): CKTOTAL, CKMB, CKMBINDEX, TROPONINI in the last 168 hours.  BNP (last 3 results) No results for input(s): BNP in the last 8760 hours.  ProBNP (last 3 results) No results for input(s): PROBNP in the last 8760 hours.  CBG: No results for input(s): GLUCAP in the last 168 hours.  Radiological Exams on Admission: Dg Abd Acute W/chest  07/27/2015  CLINICAL DATA:  Nausea and vomiting since 6 a.m. EXAM: DG ABDOMEN ACUTE W/ 1V CHEST COMPARISON:  04/21/2015 FINDINGS: Normal heart size and mediastinal contours. No acute infiltrate or edema. No effusion or pneumothorax. No acute osseous findings. Paucity of abdominal gas. No indication of bowel obstruction or perforation. Density overlapping the right iliac wing could be in the soft tissues, bowel (including appendix) or other viscera. 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 IMPRESSION: 1. Nonobstructive bowel gas pattern, but limited by paucity of bowel gas. 2. Nonspecific calcification over the right lower quadrant. Electronically Signed   By: Monte Fantasia M.D.   On: 07/27/2015 00:26     Assessment/Plan Principal Problem:   Nausea vomiting and diarrhea Active Problems:   Gastroenteritis   Hypothyroidism   Hypertension, uncontrolled   Diabetes mellitus type 2, controlled (HCC)   Vomiting   1. Nausea vomiting and diarrhea probably from viral gastroenteritis - patient states she did take some antibiotics 2 months ago. Will check stool studies for C. difficile and pathogen panel. Continue with gentle hydration. I have placed patient on clear liquid diet for  now. 2. Diabetes mellitus type 2 - will hold off metformin for now and continue Januvia with sliding scale coverage. 3. Hypertension - continue home medications. 4. Hypothyroidism on Synthroid. 5. Hyperlipidemia on statins. 6. Marijuana abuse.   DVT Prophylaxis Lovenox.  Code Status: Full code.  Family Communication: Discussed with patient.  Disposition Plan: Admit to inpatient.    Jeannelle Wiens N. [facility] Pager [phone].  If 7PM-7AM, please contact night-coverage [email or web address] Password [facility] 07/27/2015, 4:29 AM

## 2015-07-27 NOTE — Progress Notes (Signed)
Utilization review completed. Stefani Baik, RN, BSN. 

## 2015-07-27 NOTE — Progress Notes (Signed)
Admission note:  Arrival Method: Pt arrived to unit on stretcher from ED Mental Orientation: Alert and oriented x 4 Telemetry: No current orders Assessment: Completed Skin: Dry and intact IV: Left hand. Site is clean, dry and intact Pain: Pt states no pain at this time Tubes: N/A Safety Measures: Bed in lowest position, non-slip socks placed, call light within reach Fall Prevention Safety Plan: Reviewed with patient Admission Screening: Completed 6700 Orientation: Patient has been oriented to the unit, staff and to the room. Call light within reach, will continue to monitor the patient closely.   Shelbie Hutching, RN, BSN

## 2015-07-27 NOTE — Progress Notes (Signed)
Subjective: Patient admitted this morning, see detailed H&P by Dr Hal Hope 60 y.o. female history of diabetes mellitus type 2, hypertension, hyperlipidemia, hypothyroidism presents to the ER because of multiple episodes of nausea vomiting and diarrhea. Patient states her symptoms started yesterday morning. Denies any abdominal pain. Denies any sick contacts or recent travel. The patient still had persistent nausea and has been admitted for further management.  Patient complains of hiccups this morning.  Filed Vitals:   07/27/15 0552 07/27/15 0732  BP: 172/89 156/65  Pulse: 82 88  Temp: 98.6 F (37 C) 98.2 F (36.8 C)  Resp: 20 19    Chest: Clear Bilaterally Heart : S1S2 RRR Abdomen: Soft, nontender Ext : No edema Neuro: Alert, oriented x 3  A/P ? Gastroenteritis  Hypokalemia Hiccups Leukocytosis   Continue with IV fluids, advance  diet to soft diet. Baclofen 10 mg po TID prn for hiccups Replace potassium Check cbc in am    [place] [facility] Pager- [phone]

## 2015-07-28 DIAGNOSIS — I10 Essential (primary) hypertension: Secondary | ICD-10-CM | POA: Diagnosis not present

## 2015-07-28 DIAGNOSIS — K529 Noninfective gastroenteritis and colitis, unspecified: Secondary | ICD-10-CM

## 2015-07-28 DIAGNOSIS — R197 Diarrhea, unspecified: Secondary | ICD-10-CM | POA: Diagnosis not present

## 2015-07-28 DIAGNOSIS — R112 Nausea with vomiting, unspecified: Secondary | ICD-10-CM | POA: Diagnosis not present

## 2015-07-28 LAB — CBC
HCT: 41.3 % (ref 36.0–46.0)
Hemoglobin: 13.9 g/dL (ref 12.0–15.0)
MCH: 27.1 pg (ref 26.0–34.0)
MCHC: 33.7 g/dL (ref 30.0–36.0)
MCV: 80.5 fL (ref 78.0–100.0)
PLATELETS: 302 10*3/uL (ref 150–400)
RBC: 5.13 MIL/uL — AB (ref 3.87–5.11)
RDW: 15.6 % — ABNORMAL HIGH (ref 11.5–15.5)
WBC: 11.3 10*3/uL — ABNORMAL HIGH (ref 4.0–10.5)

## 2015-07-28 LAB — GLUCOSE, CAPILLARY: Glucose-Capillary: 200 mg/dL — ABNORMAL HIGH (ref 65–99)

## 2015-07-28 MED ORDER — BACLOFEN 10 MG PO TABS: 10.0000 mg | ORAL_TABLET | Freq: Three times a day (TID) | ORAL | Status: DC | PRN

## 2015-07-28 NOTE — Discharge Summary (Signed)
Physician Discharge Summary  Sara [name] [id number] DOB: [birth year] DOA: 07/26/2015  PCP: Antony Blackbird, MD  Admit date: 07/26/2015 Discharge date: 07/28/2015  Time spent: *25 minutes  Recommendations for Outpatient Follow-up:  1. Follow PCP in 2 weeks   Discharge Diagnoses:  Principal Problem:   Nausea vomiting and diarrhea Active Problems:   Gastroenteritis   Hypothyroidism   Hypertension, uncontrolled   Diabetes mellitus type 2, controlled ([place])   Vomiting   Discharge Condition: *Stable  Diet recommendation: Carb modified diet  Filed Weights   07/27/15 0048 07/27/15 2046  Weight: 124.286 kg (274 lb) 124.5 kg (274 lb 7.6 oz)    History of present illness:  60 y.o. female history of diabetes mellitus type 2, hypertension, hyperlipidemia, hypothyroidism presents to the ER because of multiple episodes of nausea vomiting and diarrhea. Patient states her symptoms started yesterday morning. Denies any abdominal pain. Denies any sick contacts or recent travel. The patient still had persistent nausea and has been admitted for further management.  Patient complains of hiccups this morning  Hospital Course:  Gastroenteritis- resolved, patient came with multiple episodes of nausea and vomiting along with diarrhea at home. All symptoms resolved when patient came to the hospital. She did not have bowel movement. So no sample could be obtained for GI pathogen panel. X-ray shows nonobstructive bowel gas pattern. Patient has been eating and drinking without any complaints. Denies abdominal pain.  Hiccups- patient developed hiccups in the hospital. Was started on baclofen 10 mg by mouth 3 times a day when necessary with good effect. At this time we will discharge her home on baclofen 10 mg by mouth 3 times a day when necessary for 5 more days.  Hypertension- patient blood pressure mildly elevated in the hospital. Currently she is on atenolol, Norvasc, lisinopril. These medications be  continued as outpatient.  Diabetes mellitus- blood glucose was well controlled in the hospital. Continue metformin, Januvia.  Hypokalemia- potassium was 3.4 yesterday which was replaced. This morning potassium is 4.1.  Leukocytosis- likely reactive. Patient's WBC was 14.6 on admission. Epididymis today's 11,000. She has been afebrile. UA negative, chest x-ray does not show pneumonia.  Patient will be discharged today. Will be given letter for excuse from work.    Procedures:  None  Consultations:  None  Discharge Exam: Filed Vitals:   07/28/15 0515 07/28/15 0819  BP: 147/92 169/91  Pulse: 72 66  Temp: 98.5 F (36.9 C) 98 F (36.7 C)  Resp: 20 20    General: Appears in no acute distress, eating breakfast Cardiovascular: S1-S2 is regular no murmurs rubs or gallops. Respiratory: Clear to auscultation bilaterally no wheezing or crackles heard. Abdomen- soft, nontender, no organomegaly.  Discharge Instructions   Discharge Instructions    Diet - low sodium heart healthy    Complete by:  As directed      Increase activity slowly    Complete by:  As directed           Current Discharge Medication List    START taking these medications   Details  baclofen (LIORESAL) 10 MG tablet Take 1 tablet (10 mg total) by mouth 3 (three) times daily as needed for muscle spasms (hiccups). Qty: 15 each, Refills: 0      CONTINUE these medications which have NOT CHANGED   Details  amLODipine (NORVASC) 10 MG tablet Take 10 mg by mouth daily.      atenolol (TENORMIN) 100 MG tablet Take 100 mg by mouth daily.  Calcium Carbonate-Vitamin D (CALCIUM + D PO) Take 2 tablets by mouth daily.      Cholecalciferol (VITAMIN D3) 2000 UNITS TABS Take 1 tablet by mouth daily.      furosemide (LASIX) 40 MG tablet Take 40 mg by mouth.    levothyroxine (SYNTHROID, LEVOTHROID) 125 MCG tablet Take 125 mcg by mouth daily before breakfast.    lisinopril (PRINIVIL,ZESTRIL) 20 MG tablet Take 20  mg by mouth daily.    metFORMIN (GLUMETZA) 500 MG (MOD) 24 hr tablet Take 500 mg by mouth daily with breakfast.    simvastatin (ZOCOR) 20 MG tablet Take 20 mg by mouth daily.    sitaGLIPtin (JANUVIA) 100 MG tablet Take 100 mg by mouth daily.    pantoprazole (PROTONIX) 40 MG tablet Take 1 tablet (40 mg total) by mouth daily. Qty: 60 tablet, Refills: 1       No Known Allergies Follow-up Information    Follow up with FULP, CAMMIE, MD In 2 weeks.   Specialty:  Family Medicine   Contact information:   [address] [place] [address] [phone]        The results of significant diagnostics from this hospitalization (including imaging, microbiology, ancillary and laboratory) are listed below for reference.    Significant Diagnostic Studies: Dg Abd Acute W/chest  07/27/2015  CLINICAL DATA:  Nausea and vomiting since 6 a.m. EXAM: DG ABDOMEN ACUTE W/ 1V CHEST COMPARISON:  04/21/2015 FINDINGS: Normal heart size and mediastinal contours. No acute infiltrate or edema. No effusion or pneumothorax. No acute osseous findings. Paucity of abdominal gas. No indication of bowel obstruction or perforation. Density overlapping the right iliac wing could be in the soft tissues, bowel (including appendix) or other viscera. 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 IMPRESSION: 1. Nonobstructive bowel gas pattern, but limited by paucity of bowel gas. 2. Nonspecific calcification over the right lower quadrant. Electronically Signed   By: Monte Fantasia M.D.   On: 07/27/2015 00:26    Microbiology: No results found for this or any previous visit (from the past 240 hour(s)).   Labs: Basic Metabolic Panel:  Recent Labs Lab 07/26/15 1538 07/26/15 2144 07/27/15 1423  NA 137 139 138  K 3.9 3.4* 4.1  CL 100* 99* 100*  CO2  --  23 28  GLUCOSE 181* 175* 164*  BUN 10 7 7   CREATININE 0.60 0.80 0.94  CALCIUM  --  9.9 9.5   Liver Function Tests:  Recent Labs Lab  07/26/15 2144 07/27/15 1423  AST 23 26  ALT 18 18  ALKPHOS 109 93  BILITOT 0.5 0.5  PROT 9.0* 7.6  ALBUMIN 4.6 3.8    Recent Labs Lab 07/26/15 2144  LIPASE 36   No results for input(s): AMMONIA in the last 168 hours. CBC:  Recent Labs Lab 07/26/15 1538 07/26/15 2144 07/28/15 0600  WBC  --  14.6* 11.3*  NEUTROABS  --  13.0*  --   HGB 16.7* 14.8 13.9  HCT 49.0* 44.4 41.3  MCV  --  81.2 80.5  PLT  --  286 302    CBG:  Recent Labs Lab 07/27/15 0047 07/27/15 0729 07/27/15 1111 07/27/15 1715 07/27/15 2035  GLUCAP 168* 170* 171* 168* 163*       Signed:  Eleonore Chiquito S MD.  [facility] 07/28/2015, 8:48 AM

## 2015-07-28 NOTE — Progress Notes (Signed)
DC instructions given to patient. Rx sent to [facility] via electronic script. VVS. All belongings sent with patient at time of DC. Educated to wash hands frequently. Medication regimen explained. Will continue to monitor until time of DC. Pt escorted  by volunteer via wheelchair to private vehicle driven by spouse.

## 2015-07-29 NOTE — Care Management Obs Status (Addendum)
MEDICARE OBSERVATION STATUS NOTIFICATION Late entry, Observation notification given 07/28/2015  Patient Details  Name: Sara [name] MRN: [id number] Date of Birth: [birth year]   Medicare Observation Status Notification Given:  Yes    Gricelda Foland, Rory Percy, RN 07/29/2015, 9:53 AM

## 2016-01-20 ENCOUNTER — Other Ambulatory Visit: Payer: Self-pay | Admitting: Internal Medicine

## 2016-01-20 DIAGNOSIS — C73 Malignant neoplasm of thyroid gland: Secondary | ICD-10-CM

## 2016-01-21 ENCOUNTER — Ambulatory Visit
Admission: RE | Admit: 2016-01-21 | Discharge: 2016-01-21 | Disposition: A | Discharge status: Home / Self Care | Payer: BC Managed Care – PPO | Source: Ambulatory Visit | Attending: Internal Medicine | Admitting: Internal Medicine

## 2016-01-21 DIAGNOSIS — C73 Malignant neoplasm of thyroid gland: Secondary | ICD-10-CM

## 2016-08-07 HISTORY — PX: COLONOSCOPY: SHX174

## 2016-08-08 ENCOUNTER — Encounter (HOSPITAL_COMMUNITY): Payer: Self-pay

## 2016-08-08 ENCOUNTER — Emergency Department (HOSPITAL_COMMUNITY): Payer: BC Managed Care – PPO

## 2016-08-08 ENCOUNTER — Observation Stay (HOSPITAL_COMMUNITY)
Admission: EM | Admit: 2016-08-08 | Discharge: 2016-08-09 | Disposition: A | Discharge status: Home / Self Care | Payer: BC Managed Care – PPO | Attending: Internal Medicine | Admitting: Internal Medicine

## 2016-08-08 DIAGNOSIS — E039 Hypothyroidism, unspecified: Secondary | ICD-10-CM | POA: Diagnosis present

## 2016-08-08 DIAGNOSIS — Z79899 Other long term (current) drug therapy: Secondary | ICD-10-CM | POA: Insufficient documentation

## 2016-08-08 DIAGNOSIS — E119 Type 2 diabetes mellitus without complications: Secondary | ICD-10-CM | POA: Diagnosis not present

## 2016-08-08 DIAGNOSIS — R112 Nausea with vomiting, unspecified: Secondary | ICD-10-CM | POA: Diagnosis present

## 2016-08-08 DIAGNOSIS — K2900 Acute gastritis without bleeding: Principal | ICD-10-CM | POA: Insufficient documentation

## 2016-08-08 DIAGNOSIS — Z7982 Long term (current) use of aspirin: Secondary | ICD-10-CM | POA: Diagnosis not present

## 2016-08-08 DIAGNOSIS — I10 Essential (primary) hypertension: Secondary | ICD-10-CM | POA: Diagnosis not present

## 2016-08-08 DIAGNOSIS — N179 Acute kidney failure, unspecified: Secondary | ICD-10-CM | POA: Diagnosis present

## 2016-08-08 DIAGNOSIS — E118 Type 2 diabetes mellitus with unspecified complications: Secondary | ICD-10-CM

## 2016-08-08 DIAGNOSIS — Z87891 Personal history of nicotine dependence: Secondary | ICD-10-CM | POA: Diagnosis not present

## 2016-08-08 DIAGNOSIS — K529 Noninfective gastroenteritis and colitis, unspecified: Secondary | ICD-10-CM | POA: Diagnosis not present

## 2016-08-08 DIAGNOSIS — E876 Hypokalemia: Secondary | ICD-10-CM | POA: Diagnosis present

## 2016-08-08 HISTORY — DX: Type 2 diabetes mellitus without complications: E11.9

## 2016-08-08 HISTORY — DX: Hypothyroidism, unspecified: E03.9

## 2016-08-08 HISTORY — DX: Pure hypercholesterolemia, unspecified: E78.00

## 2016-08-08 HISTORY — DX: Nausea with vomiting, unspecified: R11.2

## 2016-08-08 LAB — URINALYSIS, ROUTINE W REFLEX MICROSCOPIC
Bilirubin Urine: NEGATIVE
Glucose, UA: 150 mg/dL — AB
KETONES UR: 20 mg/dL — AB
Leukocytes, UA: NEGATIVE
Nitrite: NEGATIVE
PH: 6 (ref 5.0–8.0)
Protein, ur: 30 mg/dL — AB
SPECIFIC GRAVITY, URINE: 1.011 (ref 1.005–1.030)

## 2016-08-08 LAB — COMPREHENSIVE METABOLIC PANEL
ALBUMIN: 4.4 g/dL (ref 3.5–5.0)
ALK PHOS: 114 U/L (ref 38–126)
ALT: 16 U/L (ref 14–54)
AST: 21 U/L (ref 15–41)
Anion gap: 16 — ABNORMAL HIGH (ref 5–15)
BUN: 9 mg/dL (ref 6–20)
CALCIUM: 9.8 mg/dL (ref 8.9–10.3)
CO2: 22 mmol/L (ref 22–32)
CREATININE: 1.14 mg/dL — AB (ref 0.44–1.00)
Chloride: 100 mmol/L — ABNORMAL LOW (ref 101–111)
GFR calc Af Amer: 59 mL/min — ABNORMAL LOW (ref >60)
GFR calc non Af Amer: 51 mL/min — ABNORMAL LOW (ref >60)
GLUCOSE: 229 mg/dL — AB (ref 65–99)
Potassium: 3.2 mmol/L — ABNORMAL LOW (ref 3.5–5.1)
SODIUM: 138 mmol/L (ref 135–145)
Total Bilirubin: 0.6 mg/dL (ref 0.3–1.2)
Total Protein: 8.5 g/dL — ABNORMAL HIGH (ref 6.5–8.1)

## 2016-08-08 LAB — CBC
HCT: 40.7 % (ref 36.0–46.0)
HEMATOCRIT: 38 % (ref 36.0–46.0)
HEMOGLOBIN: 12.4 g/dL (ref 12.0–15.0)
Hemoglobin: 13.3 g/dL (ref 12.0–15.0)
MCH: 25.6 pg — AB (ref 26.0–34.0)
MCH: 25.5 pg — ABNORMAL LOW (ref 26.0–34.0)
MCHC: 32.7 g/dL (ref 30.0–36.0)
MCHC: 32.6 g/dL (ref 30.0–36.0)
MCV: 78.3 fL (ref 78.0–100.0)
MCV: 78.2 fL (ref 78.0–100.0)
PLATELETS: 331 10*3/uL (ref 150–400)
Platelets: 310 10*3/uL (ref 150–400)
RBC: 5.2 MIL/uL — ABNORMAL HIGH (ref 3.87–5.11)
RBC: 4.86 MIL/uL (ref 3.87–5.11)
RDW: 17 % — AB (ref 11.5–15.5)
RDW: 16.7 % — AB (ref 11.5–15.5)
WBC: 13.5 10*3/uL — ABNORMAL HIGH (ref 4.0–10.5)
WBC: 12.2 10*3/uL — ABNORMAL HIGH (ref 4.0–10.5)

## 2016-08-08 LAB — GLUCOSE, CAPILLARY
GLUCOSE-CAPILLARY: 178 mg/dL — AB (ref 65–99)
GLUCOSE-CAPILLARY: 158 mg/dL — AB (ref 65–99)

## 2016-08-08 LAB — LIPASE, BLOOD: Lipase: 15 U/L (ref 11–51)

## 2016-08-08 LAB — POCT GASTRIC OCCULT BLOOD (1-CARD TO LAB): OCCULT BLOOD, GASTRIC: POSITIVE — AB

## 2016-08-08 LAB — GASTRIC OCCULT BLOOD (1-CARD TO LAB)

## 2016-08-08 MED ORDER — INSULIN ASPART 100 UNIT/ML ~~LOC~~ SOLN
0.0000 [IU] | Freq: Three times a day (TID) | SUBCUTANEOUS | Status: DC
  Administered 2016-08-08 – 2016-08-09 (×2): 2 [IU] via SUBCUTANEOUS
  Administered 2016-08-09: 3 [IU] via SUBCUTANEOUS

## 2016-08-08 MED ORDER — ACETAMINOPHEN 650 MG RE SUPP: 650.0000 mg | Freq: Four times a day (QID) | RECTAL | Status: DC | PRN

## 2016-08-08 MED ORDER — ACETAMINOPHEN 325 MG PO TABS: 650.0000 mg | ORAL_TABLET | Freq: Four times a day (QID) | ORAL | Status: DC | PRN

## 2016-08-08 MED ORDER — ONDANSETRON HCL 4 MG/2ML IJ SOLN
4.0000 mg | INTRAMUSCULAR | Status: AC
  Administered 2016-08-08 (×3): 4 mg via INTRAVENOUS
  Filled 2016-08-08 (×3): qty 2

## 2016-08-08 MED ORDER — ONDANSETRON HCL 4 MG/2ML IJ SOLN: 4.0000 mg | Freq: Four times a day (QID) | INTRAMUSCULAR | Status: DC | PRN

## 2016-08-08 MED ORDER — AMLODIPINE BESYLATE 10 MG PO TABS
10.0000 mg | ORAL_TABLET | Freq: Every day | ORAL | Status: DC
  Administered 2016-08-08 – 2016-08-09 (×2): 10 mg via ORAL
  Filled 2016-08-08: qty 2
  Filled 2016-08-08: qty 1

## 2016-08-08 MED ORDER — ONDANSETRON HCL 4 MG/2ML IJ SOLN
4.0000 mg | Freq: Once | INTRAMUSCULAR | Status: AC | PRN
  Administered 2016-08-08: 4 mg via INTRAVENOUS
  Filled 2016-08-08: qty 2

## 2016-08-08 MED ORDER — BIOTIN 1000 MCG PO TABS: 1000.0000 ug | ORAL_TABLET | Freq: Every day | ORAL | Status: DC

## 2016-08-08 MED ORDER — PANTOPRAZOLE SODIUM 40 MG PO TBEC: 40.0000 mg | DELAYED_RELEASE_TABLET | Freq: Every day | ORAL | Status: DC

## 2016-08-08 MED ORDER — PANTOPRAZOLE SODIUM 40 MG IV SOLR
40.0000 mg | Freq: Once | INTRAVENOUS | Status: AC
  Administered 2016-08-08: 40 mg via INTRAVENOUS
  Filled 2016-08-08: qty 40

## 2016-08-08 MED ORDER — INSULIN ASPART 100 UNIT/ML ~~LOC~~ SOLN: 0.0000 [IU] | Freq: Every day | SUBCUTANEOUS | Status: DC

## 2016-08-08 MED ORDER — SODIUM CHLORIDE 0.9 % IV BOLUS (SEPSIS)
1000.0000 mL | Freq: Once | INTRAVENOUS | Status: AC
  Administered 2016-08-08: 1000 mL via INTRAVENOUS

## 2016-08-08 MED ORDER — SODIUM CHLORIDE 0.9% FLUSH
3.0000 mL | Freq: Two times a day (BID) | INTRAVENOUS | Status: DC
  Administered 2016-08-08: 3 mL via INTRAVENOUS

## 2016-08-08 MED ORDER — SIMVASTATIN 20 MG PO TABS
20.0000 mg | ORAL_TABLET | Freq: Every day | ORAL | Status: DC
  Administered 2016-08-08: 20 mg via ORAL
  Filled 2016-08-08 (×2): qty 1

## 2016-08-08 MED ORDER — KETOROLAC TROMETHAMINE 15 MG/ML IJ SOLN: 15.0000 mg | Freq: Four times a day (QID) | INTRAMUSCULAR | Status: DC | PRN

## 2016-08-08 MED ORDER — ATENOLOL 25 MG PO TABS
25.0000 mg | ORAL_TABLET | Freq: Every day | ORAL | Status: DC
  Administered 2016-08-08 – 2016-08-09 (×2): 25 mg via ORAL
  Filled 2016-08-08 (×2): qty 1

## 2016-08-08 MED ORDER — LEVOTHYROXINE SODIUM 50 MCG PO TABS
175.0000 ug | ORAL_TABLET | Freq: Every day | ORAL | Status: DC
  Administered 2016-08-09: 175 ug via ORAL
  Filled 2016-08-08: qty 1

## 2016-08-08 MED ORDER — SODIUM CHLORIDE 0.9 % IV SOLN
INTRAVENOUS | Status: AC
  Administered 2016-08-08: 14:00:00 via INTRAVENOUS

## 2016-08-08 MED ORDER — POTASSIUM CHLORIDE CRYS ER 10 MEQ PO TBCR
10.0000 meq | EXTENDED_RELEASE_TABLET | Freq: Every day | ORAL | Status: DC
  Administered 2016-08-08 – 2016-08-09 (×2): 10 meq via ORAL
  Filled 2016-08-08 (×2): qty 1

## 2016-08-08 MED ORDER — ONDANSETRON HCL 4 MG PO TABS
4.0000 mg | ORAL_TABLET | Freq: Four times a day (QID) | ORAL | Status: DC | PRN
Start: 2016-08-08 — End: 2016-08-09

## 2016-08-08 NOTE — ED Notes (Signed)
Admitting at bedside 

## 2016-08-08 NOTE — ED Triage Notes (Signed)
Pt had colonoscopy yesterday. Reports nausea/vomiting starting last night, unable to tolerate PO food/fluids. Pt reports bloody emesis. Denies diarrhea/abd pain.

## 2016-08-08 NOTE — ED Notes (Signed)
Lab called RN to inform not enough urine to run UA. Pt informed of need to recollect.

## 2016-08-08 NOTE — ED Notes (Signed)
Unable to obtain labs w/ IV start. Phleb at bedside to draw labs

## 2016-08-08 NOTE — ED Notes (Signed)
Ambulated pt to restroom, no issues. Pt back on monitor.

## 2016-08-08 NOTE — ED Provider Notes (Signed)
[facility] DEPT Provider Note   CSN: [id number] Arrival date & time: 08/08/16  [id number]     History   Chief Complaint Chief Complaint  Patient presents with  . Emesis    HPI Sara [name] is a 61 y.o. female.  S/p colonoscopy yesterday by Dr Earlean Shawl.  Patient reports nausea and vomiting since last night, inability to keep fluids down, blood in the emesis. No black stool. No fever, sweats, chills, chest pain, dyspnea, dysuria. Verity symptoms is moderate.      Past Medical History:  Diagnosis Date  . Diabetes mellitus    ;" pre diabetic "  . Hypertension     Patient Active Problem List   Diagnosis Date Noted  . Nausea vomiting and diarrhea 07/27/2015  . Hypertension, uncontrolled 07/27/2015  . Diabetes mellitus type 2, controlled ([place]) 07/27/2015  . Vomiting 07/27/2015  . Gastroenteritis 07/04/2011    Class: Acute  . Acute renal failure ([place]) 07/04/2011    Class: Acute  . Hypothyroidism 07/04/2011  . HTN (hypertension) 07/04/2011    Past Surgical History:  Procedure Laterality Date  . ESOPHAGOGASTRODUODENOSCOPY  07/06/2011   Procedure: ESOPHAGOGASTRODUODENOSCOPY (EGD);  Surgeon: Beryle Beams, MD;  Location: [facility] ENDOSCOPY;  Service: Endoscopy;  Laterality: N/A;  . OVARIAN CYST REMOVAL    . THYROIDECTOMY      OB History    No data available       Home Medications    Prior to Admission medications   Medication Sig Start Date End Date Taking? Authorizing Provider  acetaminophen (TYLENOL) 500 MG chewable tablet Chew 500 mg by mouth every 6 (six) hours as needed for pain.   Yes Historical Provider, MD  amLODipine (NORVASC) 10 MG tablet Take 10 mg by mouth daily.     Yes Historical Provider, MD  aspirin 81 MG chewable tablet Chew 81 mg by mouth daily.   Yes Historical Provider, MD  Astaxanthin 4 MG CAPS Take 4 mg by mouth daily.   Yes Historical Provider, MD  atenolol (TENORMIN) 25 MG tablet Take 25 mg by mouth daily. 07/24/16  Yes Historical Provider, MD    Biotin 1000 MCG tablet Take 1,000 mcg by mouth daily.   Yes Historical Provider, MD  Calcium Carbonate-Vitamin D (CALCIUM + D PO) Take 2 tablets by mouth daily.     Yes Historical Provider, MD  Calcium-Magnesium-Vitamin D (CITRACAL CALCIUM+D) 600-40-500 MG-MG-UNIT TB24 Take 1 tablet by mouth daily.   Yes Historical Provider, MD  Cholecalciferol (VITAMIN D3) 2000 UNITS TABS Take 1 tablet by mouth daily.     Yes Historical Provider, MD  furosemide (LASIX) 20 MG tablet Take 20 mg by mouth daily. 08/06/16  Yes Historical Provider, MD  levothyroxine (SYNTHROID, LEVOTHROID) 175 MCG tablet Take 175 mcg by mouth daily before breakfast.   Yes Historical Provider, MD  lisinopril (PRINIVIL,ZESTRIL) 20 MG tablet Take 20 mg by mouth daily.   Yes Historical Provider, MD  potassium chloride (K-DUR,KLOR-CON) 10 MEQ tablet Take 10 mEq by mouth daily.   Yes Historical Provider, MD  simvastatin (ZOCOR) 20 MG tablet Take 20 mg by mouth daily.   Yes Historical Provider, MD  sitaGLIPtin (JANUVIA) 100 MG tablet Take 100 mg by mouth daily.   Yes Historical Provider, MD  baclofen (LIORESAL) 10 MG tablet Take 1 tablet (10 mg total) by mouth 3 (three) times daily as needed for muscle spasms (hiccups). Patient not taking: Reported on 08/08/2016 07/28/15   Oswald Hillock, MD  pantoprazole (PROTONIX) 40 MG tablet Take  1 tablet (40 mg total) by mouth daily. 07/08/11 07/07/12  Nita Sells, MD    Family History Family History  Problem Relation Age of Onset  . Diabetes Mellitus II Mother     Social History Social History  Substance Use Topics  . Smoking status: Former Research scientist (life sciences)  . Smokeless tobacco: Never Used     Comment: quit 1991  . Alcohol use Yes     Comment: occasional     Allergies   Dyazide [hydrochlorothiazide w-triamterene]   Review of Systems Review of Systems  All other systems reviewed and are negative.    Physical Exam Updated Vital Signs BP 181/80   Pulse 81   Temp 98.3 F (36.8 C)   Resp  16   Ht 5\' 11"  (1.803 m)   Wt 270 lb (122.5 kg)   SpO2 96%   BMI 37.66 kg/m   Physical Exam  Constitutional: She is oriented to person, place, and time.  Obese, dehydrated  HENT:  Head: Normocephalic and atraumatic.  Eyes: Conjunctivae are normal.  Neck: Neck supple.  Cardiovascular: Normal rate and regular rhythm.   Pulmonary/Chest: Effort normal and breath sounds normal.  Abdominal: Soft. Bowel sounds are normal.  Musculoskeletal: Normal range of motion.  Neurological: She is alert and oriented to person, place, and time.  Skin: Skin is warm and dry.  Psychiatric: She has a normal mood and affect. Her behavior is normal.  Nursing note and vitals reviewed.    ED Treatments / Results  Labs (all labs ordered are listed, but only abnormal results are displayed) Labs Reviewed  COMPREHENSIVE METABOLIC PANEL - Abnormal; Notable for the following:       Result Value   Potassium 3.2 (*)    Chloride 100 (*)    Glucose, Bld 229 (*)    Creatinine, Ser 1.14 (*)    Total Protein 8.5 (*)    GFR calc non Af Amer 51 (*)    GFR calc Af Amer 59 (*)    Anion gap 16 (*)    All other components within normal limits  CBC - Abnormal; Notable for the following:    WBC 13.5 (*)    RBC 5.20 (*)    MCH 25.6 (*)    RDW 17.0 (*)    All other components within normal limits  URINALYSIS, ROUTINE W REFLEX MICROSCOPIC - Abnormal; Notable for the following:    Color, Urine STRAW (*)    Glucose, UA 150 (*)    Hgb urine dipstick SMALL (*)    Ketones, ur 20 (*)    Protein, ur 30 (*)    Bacteria, UA RARE (*)    Squamous Epithelial / LPF 0-5 (*)    All other components within normal limits  POCT GASTRIC OCCULT BLOOD (1-CARD TO LAB) - Abnormal; Notable for the following:    Occult Blood, Gastric POSITIVE (*)    All other components within normal limits  LIPASE, BLOOD    EKG  EKG Interpretation None       Radiology Dg Abdomen Acute W/chest  Result Date: 08/08/2016 CLINICAL DATA:   Nausea, vomiting. EXAM: DG ABDOMEN ACUTE W/ 1V CHEST COMPARISON:  Radiographs of July 26, 2015. FINDINGS: There is no evidence of dilated bowel loops or free intraperitoneal air. Phleboliths are noted in the pelvis. Heart size and mediastinal contours are within normal limits. Both lungs are clear. IMPRESSION: No evidence of bowel obstruction or ileus. No acute cardiopulmonary disease. Electronically Signed   By: Sabino Dick  Brooke Bonito, M.D.   On: 08/08/2016 09:09    Procedures Procedures (including critical care time)  Medications Ordered in ED Medications  ondansetron (ZOFRAN) injection 4 mg (4 mg Intravenous Given 08/08/16 0756)  sodium chloride 0.9 % bolus 1,000 mL (0 mLs Intravenous Stopped 08/08/16 1100)  pantoprazole (PROTONIX) injection 40 mg (40 mg Intravenous Given 08/08/16 0845)  sodium chloride 0.9 % bolus 1,000 mL (0 mLs Intravenous Stopped 08/08/16 0946)     Initial Impression / Assessment and Plan / ED Course  I have reviewed the triage vital signs and the nursing notes.  Pertinent labs & imaging results that were available during my care of the patient were reviewed by me and considered in my medical decision making (see chart for details).     Patient has blood-tinged emesis which is is hemoccult positive. IV fluids, IV Zofran, IV Protonix.   On recheck at approximately 11:30, had patient another episode of blood-tinged emesis. Will admit to general medicine.  Final Clinical Impressions(s) / ED Diagnoses   Final diagnoses:  Acute gastritis, presence of bleeding unspecified, unspecified gastritis type    New Prescriptions New Prescriptions   No medications on file     Nat Christen, MD 08/08/16 1249

## 2016-08-08 NOTE — ED Notes (Signed)
Dr. Lacinda Axon at bedside to discuss plan of care w/ pt

## 2016-08-08 NOTE — ED Notes (Signed)
Pt ambulatory w/ steady gait to restroom to obtain urine specimen.

## 2016-08-08 NOTE — Progress Notes (Signed)
New Admission Note: transfer from ED  Arrival Method: stretcher Mental Orientation: a/o x4 Telemetry: placed Assessment: Completed Skin:clean dry intact IV:RFA NS 50 Pain: none Tubes:none Safety Measures: Safety Fall Prevention Plan has been given, discussed and signed Admission: Completed Unit Orientation: Patient has been orientated to the room, unit and staff.  Family:not present  Orders have been reviewed and implemented. Will continue to monitor the patient. Call light has been placed within reach and bed alarm has been activated.   Retta Mac BSN, RN

## 2016-08-08 NOTE — ED Notes (Signed)
Pt reminded of need for urine specimen, states she cannot go now but will try in a little bit.

## 2016-08-08 NOTE — ED Notes (Signed)
This RN walked to lab to bring proper occult card for emesis specimen. Specimen placed on card in lab, given to lab tech. Lab tech will run occult blood card now.

## 2016-08-08 NOTE — H&P (Signed)
History and Physical    Sara [name] [id number] DOB: 12-15-1955 DOA: 08/08/2016  PCP: Antony Blackbird, MD Patient coming from: home  Chief Complaint: nausea/vomiting  HPI: Sara [name] is a very pleasant 61 y.o. female with medical history significant for hypertension, diabetes presents to the emergency Department chief complaint persistent nausea and vomiting. Patient is one day status post colonoscopy had 2 episodes of blood-tinged emesis while in the emergency department. Hemoglobin is stable she is hemodynamically stable being admitted for observation  Information is obtained from the patient. She reports being in her usual state of health till yesterday she underwent a colonoscopy. She reports she got "a good report" from the colonoscopy but became nauseated and had 2 episodes of emesis before leaving the office. She said she went home planning to rest most of the day and get something to eat. She states she did sleep a good portion of the day but then awakened with some nausea and vomiting. She was unable to keep any liquids down. She tried to take her home medications and was unable to keep those down as well. He reports some blood in the emesis. He denies abdominal pain diarrhea melena bright red blood per rectum. She denies headache fever chills syncope or near-syncope. He denies chest pain palpitation shortness of breath lower extremity edema.    ED Course: In the emergency department she's afebrile hypertensive and not hypoxic.  Review of Systems: As per HPI otherwise 10 point review of systems negative.   Ambulatory Status: Ambulates independently no recent falls  Past Medical History:  Diagnosis Date  . Diabetes mellitus    ;" pre diabetic "  . Hypertension   . Nausea and vomiting     Past Surgical History:  Procedure Laterality Date  . ESOPHAGOGASTRODUODENOSCOPY  07/06/2011   Procedure: ESOPHAGOGASTRODUODENOSCOPY (EGD);  Surgeon: Beryle Beams, MD;  Location:  [facility] ENDOSCOPY;  Service: Endoscopy;  Laterality: N/A;  . OVARIAN CYST REMOVAL    . THYROIDECTOMY      Social History   Social History  . Marital status: Married    Spouse name: N/A  . Number of children: N/A  . Years of education: N/A   Occupational History  . Not on file.   Social History Main Topics  . Smoking status: Former Research scientist (life sciences)  . Smokeless tobacco: Never Used     Comment: quit 1991  . Alcohol use Yes     Comment: occasional  . Drug use: No  . Sexual activity: Yes    Birth control/ protection: Post-menopausal   Other Topics Concern  . Not on file   Social History Narrative  . No narrative on file  She was at home with her husband and her child. 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 Dyazide [Hydrochlorothiazide W-Triamterene] Other (See Comments)    intolerance    Family History  Problem Relation Age of Onset  . Diabetes Mellitus II Mother     Prior to Admission medications   Medication Sig Start Date End Date Taking? Authorizing Provider  acetaminophen (TYLENOL) 500 MG chewable tablet Chew 500 mg by mouth every 6 (six) hours as needed for pain.   Yes Historical Provider, MD  amLODipine (NORVASC) 10 MG tablet Take 10 mg by mouth daily.     Yes Historical Provider, MD  aspirin 81 MG chewable tablet Chew 81 mg by mouth daily.   Yes Historical Provider, MD  Astaxanthin 4  MG CAPS Take 4 mg by mouth daily.   Yes Historical Provider, MD  atenolol (TENORMIN) 25 MG tablet Take 25 mg by mouth daily. 07/24/16  Yes Historical Provider, MD  Biotin 1000 MCG tablet Take 1,000 mcg by mouth daily.   Yes Historical Provider, MD  Calcium Carbonate-Vitamin D (CALCIUM + D PO) Take 2 tablets by mouth daily.     Yes Historical Provider, MD  Calcium-Magnesium-Vitamin D (CITRACAL CALCIUM+D) 600-40-500 MG-MG-UNIT TB24 Take 1 tablet by mouth daily.   Yes Historical Provider, MD  Cholecalciferol (VITAMIN D3) 2000 UNITS TABS Take 1  tablet by mouth daily.     Yes Historical Provider, MD  furosemide (LASIX) 20 MG tablet Take 20 mg by mouth daily. 08/06/16  Yes Historical Provider, MD  levothyroxine (SYNTHROID, LEVOTHROID) 175 MCG tablet Take 175 mcg by mouth daily before breakfast.   Yes Historical Provider, MD  lisinopril (PRINIVIL,ZESTRIL) 20 MG tablet Take 20 mg by mouth daily.   Yes Historical Provider, MD  potassium chloride (K-DUR,KLOR-CON) 10 MEQ tablet Take 10 mEq by mouth daily.   Yes Historical Provider, MD  simvastatin (ZOCOR) 20 MG tablet Take 20 mg by mouth daily.   Yes Historical Provider, MD  sitaGLIPtin (JANUVIA) 100 MG tablet Take 100 mg by mouth daily.   Yes Historical Provider, MD  pantoprazole (PROTONIX) 40 MG tablet Take 1 tablet (40 mg total) by mouth daily. 07/08/11 07/07/12  Nita Sells, MD    Physical Exam: Vitals:   08/08/16 0955 08/08/16 1100 08/08/16 1145 08/08/16 1215  BP: (!) 171/101 178/90 185/82 181/80  Pulse: 78 83 92 81  Resp: 16 16 16 16   Temp:      SpO2: 98% 99% 95% 96%  Weight:      Height:         General:  Appears calm and comfortable No acute distress Eyes:  PERRL, EOMI, normal lids, iris ENT:  grossly normal hearing, lips & tongue, mucous membranes of her mouth are moist and pink Neck:  no LAD, masses or thyromegaly Cardiovascular:  RRR, no m/r/g. No LE edema.  Respiratory:  CTA bilaterally, no w/r/r. Normal respiratory effort. Abdomen:  soft, ntnd, obese positive bowel sounds but sluggish no guarding or rebounding Skin:  no rash or induration seen on limited exam Musculoskeletal:  grossly normal tone BUE/BLE, good ROM, no bony abnormality Psychiatric:  grossly normal mood and affect, speech fluent and appropriate, AOx3 Neurologic:  CN 2-12 grossly intact, moves all extremities in coordinated fashion, sensation intact speech clear facial symmetry  Labs on Admission: I have personally reviewed following labs and imaging studies  CBC:  Recent Labs Lab  08/08/16 0802  WBC 13.5*  HGB 13.3  HCT 40.7  MCV 78.3  PLT [id number]   Basic Metabolic Panel:  Recent Labs Lab 08/08/16 0802  NA 138  K 3.2*  CL 100*  CO2 22  GLUCOSE 229*  BUN 9  CREATININE 1.14*  CALCIUM 9.8   GFR: Estimated Creatinine Clearance: 75.8 mL/min (by C-G formula based on SCr of 1.14 mg/dL (H)). Liver Function Tests:  Recent Labs Lab 08/08/16 0802  AST 21  ALT 16  ALKPHOS 114  BILITOT 0.6  PROT 8.5*  ALBUMIN 4.4    Recent Labs Lab 08/08/16 0802  LIPASE 15   No results for input(s): AMMONIA in the last 168 hours. Coagulation Profile: No results for input(s): INR, PROTIME in the last 168 hours. Cardiac Enzymes: No results for input(s): CKTOTAL, CKMB, CKMBINDEX, TROPONINI in the last 168 hours.  BNP (last 3 results) No results for input(s): PROBNP in the last 8760 hours. HbA1C: No results for input(s): HGBA1C in the last 72 hours. CBG: No results for input(s): GLUCAP in the last 168 hours. Lipid Profile: No results for input(s): CHOL, HDL, LDLCALC, TRIG, CHOLHDL, LDLDIRECT in the last 72 hours. Thyroid Function Tests: No results for input(s): TSH, T4TOTAL, FREET4, T3FREE, THYROIDAB in the last 72 hours. Anemia Panel: No results for input(s): VITAMINB12, FOLATE, FERRITIN, TIBC, IRON, RETICCTPCT in the last 72 hours. Urine analysis:    Component Value Date/Time   COLORURINE STRAW (A) 08/08/2016 1040   APPEARANCEUR CLEAR 08/08/2016 1040   LABSPEC 1.011 08/08/2016 1040   PHURINE 6.0 08/08/2016 1040   GLUCOSEU 150 (A) 08/08/2016 1040   HGBUR SMALL (A) 08/08/2016 1040   BILIRUBINUR NEGATIVE 08/08/2016 1040   KETONESUR 20 (A) 08/08/2016 1040   PROTEINUR 30 (A) 08/08/2016 1040   UROBILINOGEN 0.2 07/07/2011 0808   NITRITE NEGATIVE 08/08/2016 1040   LEUKOCYTESUR NEGATIVE 08/08/2016 1040    Creatinine Clearance: Estimated Creatinine Clearance: 75.8 mL/min (by C-G formula based on SCr of 1.14 mg/dL (H)).  Sepsis  Labs: @LABRCNTIP (procalcitonin:4,lacticidven:4) )No results found for this or any previous visit (from the past 240 hour(s)).   Radiological Exams on Admission: Dg Abdomen Acute W/chest  Result Date: 08/08/2016 CLINICAL DATA:  Nausea, vomiting. EXAM: DG ABDOMEN ACUTE W/ 1V CHEST COMPARISON:  Radiographs of July 26, 2015. FINDINGS: There is no evidence of dilated bowel loops or free intraperitoneal air. Phleboliths are noted in the pelvis. Heart size and mediastinal contours are within normal limits. Both lungs are clear. IMPRESSION: No evidence of bowel obstruction or ileus. No acute cardiopulmonary disease. Electronically Signed   By: Marijo Conception, M.D.   On: 08/08/2016 09:09    EKG:   Assessment/Plan Active Problems:   Gastroenteritis   Acute renal failure (HCC)   Hypothyroidism   HTN (hypertension)   Diabetes mellitus type 2, controlled (HCC)   Vomiting   Hematemesis   Leukocytosis   Hypokalemia   #1. Gastroenteritis/vomiting. Status post colonoscopy yesterday. Patient states she was vomiting before leaving the office. History of same 2013 and 2015 which time it was thought she had a virus. Resolved itself with bowel rest. Abdominal xray without acute abnormality. Maybe related to colonoscopy prep in setting of anesthesia for colonoscopy.  -Admit -Clear liquids -Scheduled Zofran -Advance diet as tolerated -Gentle IV fluids  #2. Hematemesis. Blood tinged emesis 2 in the emergency department. Emesis positive for occult blood. The globe and stable at 13. Chart review indicates patient with a history of esophagitis -IV fluids -serial CBCs -Scheduled Zofran -monitor -if no improvement consider GI consult -may benefit GI follow up OP  3. Hypertension. Control in the emergency department. Home medications include atenolol, amlodipine, Lasix, lisinopril -continue norvasc and atenolol -hold lasix and lisinopril for now -resume home meds as indicated -monitor  4.  Diabetes. Serum glucose 229 on admission. Medications include oral agents only -Hold oral agents for now -Obtain a hemoglobin A1c -Riding scale for optimal control  #5. Hypokalemia. Mild. Potassium level 3.2. Likely related to #1. In the setting of Lasix -Lasix for today -replete -recheck  #6. Acute renal failure. Mild. Creatinine 1.14. Likely related to #1 in the setting of Lasix. -Hold nephrotoxins -Monitor urine output -IV fluids -Recheck in the morning  7.leukocytosis. WBC 13.5. Likely reactive. No indication of UTI, abdominal xray reveals clear lung bases. Afebrile and non-toxic appearing.  -monitor    DVT prophylaxis: lovenox  Code Status: full  Family Communication: none present  Disposition Plan: home hopefully tomorrow  Consults called: none Admission status: obs    Dyanne Carrel M MD [facility]  If 7PM-7AM, please contact night-coverage [email or web address] Password [facility]  08/08/2016, 1:43 PM

## 2016-08-08 NOTE — ED Notes (Signed)
Ambulate pt to collect urine specimen, pt back on monitor.

## 2016-08-08 NOTE — ED Notes (Signed)
Pt ambulatory w/ steady gait to restroom. 

## 2016-08-09 DIAGNOSIS — K2900 Acute gastritis without bleeding: Secondary | ICD-10-CM | POA: Diagnosis not present

## 2016-08-09 LAB — CBC
HEMATOCRIT: 40 % (ref 36.0–46.0)
HEMOGLOBIN: 12.8 g/dL (ref 12.0–15.0)
MCH: 25.3 pg — AB (ref 26.0–34.0)
MCHC: 32 g/dL (ref 30.0–36.0)
MCV: 79.2 fL (ref 78.0–100.0)
Platelets: 320 10*3/uL (ref 150–400)
RBC: 5.05 MIL/uL (ref 3.87–5.11)
RDW: 17.1 % — ABNORMAL HIGH (ref 11.5–15.5)
WBC: 11.6 10*3/uL — ABNORMAL HIGH (ref 4.0–10.5)

## 2016-08-09 LAB — HEMOGLOBIN A1C
HEMOGLOBIN A1C: 7.2 % — AB (ref 4.8–5.6)
MEAN PLASMA GLUCOSE: 160 mg/dL

## 2016-08-09 LAB — BASIC METABOLIC PANEL
ANION GAP: 15 (ref 5–15)
BUN: 11 mg/dL (ref 6–20)
CALCIUM: 9.4 mg/dL (ref 8.9–10.3)
CO2: 24 mmol/L (ref 22–32)
Chloride: 98 mmol/L — ABNORMAL LOW (ref 101–111)
Creatinine, Ser: 0.91 mg/dL (ref 0.44–1.00)
GFR calc Af Amer: >60 mL/min (ref >60)
GLUCOSE: 174 mg/dL — AB (ref 65–99)
Potassium: 3.3 mmol/L — ABNORMAL LOW (ref 3.5–5.1)
Sodium: 137 mmol/L (ref 135–145)

## 2016-08-09 LAB — GLUCOSE, CAPILLARY
GLUCOSE-CAPILLARY: 163 mg/dL — AB (ref 65–99)
GLUCOSE-CAPILLARY: 213 mg/dL — AB (ref 65–99)

## 2016-08-09 MED ORDER — SUCRALFATE 1 GM/10ML PO SUSP: 1.0000 g | Freq: Three times a day (TID) | ORAL | 3 refills | Status: DC

## 2016-08-09 MED ORDER — SUCRALFATE 1 GM/10ML PO SUSP
1.0000 g | Freq: Three times a day (TID) | ORAL | Status: DC
  Administered 2016-08-09: 1 g via ORAL
  Filled 2016-08-09: qty 10

## 2016-08-09 MED ORDER — BACLOFEN 10 MG PO TABS: 10.0000 mg | ORAL_TABLET | Freq: Three times a day (TID) | ORAL | 0 refills | Status: AC

## 2016-08-09 MED ORDER — PANTOPRAZOLE SODIUM 40 MG PO TBEC: 40.0000 mg | DELAYED_RELEASE_TABLET | Freq: Every day | ORAL | 2 refills | Status: DC

## 2016-08-09 MED ORDER — POTASSIUM CHLORIDE CRYS ER 20 MEQ PO TBCR
40.0000 meq | EXTENDED_RELEASE_TABLET | Freq: Once | ORAL | Status: AC
  Administered 2016-08-09: 40 meq via ORAL
  Filled 2016-08-09: qty 2

## 2016-08-09 MED ORDER — BACLOFEN 10 MG PO TABS
20.0000 mg | ORAL_TABLET | Freq: Once | ORAL | Status: AC
  Administered 2016-08-09: 20 mg via ORAL
  Filled 2016-08-09: qty 2

## 2016-08-09 MED ORDER — PANTOPRAZOLE SODIUM 40 MG PO TBEC
40.0000 mg | DELAYED_RELEASE_TABLET | Freq: Two times a day (BID) | ORAL | Status: DC
  Administered 2016-08-09: 40 mg via ORAL
  Filled 2016-08-09: qty 1

## 2016-08-09 NOTE — Discharge Summary (Signed)
Physician Discharge Summary  TAISIA FANTINI MRN: [id number] DOB/AGE: October 23, 1955 61 y.o.  PCP: Pcp Not In System   Admit date: 08/08/2016 Discharge date: 08/09/2016  Discharge Diagnoses:    Active Problems:   Gastroenteritis   Acute renal failure (HCC)   Hypothyroidism   HTN (hypertension)   Diabetes mellitus type 2, controlled (HCC)   Vomiting   Hematemesis   Leukocytosis   Hypokalemia   Acute gastritis   Diabetes mellitus with complication (HCC)    Follow-up recommendations Follow-up with PCP in 3-5 days , including all  additional recommended appointments as below Follow-up CBC, CMP in 3-5 days Patient to follow-up with Dr. Jeani Hawking of a recent EGD showed esophagitis      Current Discharge Medication List    CONTINUE these medications which have CHANGED   Details  sucralfate (CARAFATE) 1 GM/10ML suspension Take 10 mLs (1 g total) by mouth 4 (four) times daily -  with meals and at bedtime. Qty: 420 mL, Refills: 3      CONTINUE these medications which have NOT CHANGED   Details  acetaminophen (TYLENOL) 500 MG chewable tablet Chew 500 mg by mouth every 6 (six) hours as needed for pain.    amLODipine (NORVASC) 10 MG tablet Take 10 mg by mouth daily.      aspirin 81 MG chewable tablet Chew 81 mg by mouth daily.    Astaxanthin 4 MG CAPS Take 4 mg by mouth daily.    atenolol (TENORMIN) 25 MG tablet Take 25 mg by mouth daily.    Biotin 1000 MCG tablet Take 1,000 mcg by mouth daily.    Calcium Carbonate-Vitamin D (CALCIUM + D PO) Take 2 tablets by mouth daily.      Calcium-Magnesium-Vitamin D (CITRACAL CALCIUM+D) 600-40-500 MG-MG-UNIT TB24 Take 1 tablet by mouth daily.    Cholecalciferol (VITAMIN D3) 2000 UNITS TABS Take 1 tablet by mouth daily.      levothyroxine (SYNTHROID, LEVOTHROID) 175 MCG tablet Take 175 mcg by mouth daily before breakfast.    lisinopril (PRINIVIL,ZESTRIL) 20 MG tablet Take 20 mg by mouth daily.    potassium chloride  (K-DUR,KLOR-CON) 10 MEQ tablet Take 10 mEq by mouth daily.    simvastatin (ZOCOR) 20 MG tablet Take 20 mg by mouth daily.    sitaGLIPtin (JANUVIA) 100 MG tablet Take 100 mg by mouth daily.    pantoprazole (PROTONIX) 40 MG tablet Take 1 tablet (40 mg total) by mouth daily. Qty: 60 tablet, Refills: 1      STOP taking these medications     furosemide (LASIX) 20 MG tablet          Discharge Condition: Stable   Discharge Instructions Get Medicines reviewed and adjusted: Please take all your medications with you for your next visit with your Primary MD  Please request your Primary MD to go over all hospital tests and procedure/radiological results at the follow up, please ask your Primary MD to get all Hospital records sent to his/her office.  If you experience worsening of your admission symptoms, develop shortness of breath, life threatening emergency, suicidal or homicidal thoughts you must seek medical attention immediately by calling 911 or calling your MD immediately if symptoms less severe.  You must read complete instructions/literature along with all the possible adverse reactions/side effects for all the Medicines you take and that have been prescribed to you. Take any new Medicines after you have completely understood and accpet all the possible adverse reactions/side effects.   Do not drive when  taking Pain medications.   Do not take more than prescribed Pain, Sleep and Anxiety Medications  Special Instructions: If you have smoked or chewed Tobacco in the last 2 yrs please stop smoking, stop any regular Alcohol and or any Recreational drug use.  Wear Seat belts while driving.  Please note  You were cared for by a hospitalist during your hospital stay. Once you are discharged, your primary care physician will handle any further medical issues. Please note that NO REFILLS for any discharge medications will be authorized once you are discharged, as it is imperative that  you return to your primary care physician (or establish a relationship with a primary care physician if you do not have one) for your aftercare needs so that they can reassess your need for medications and monitor your lab values.     Allergies  Allergen Reactions  . Dyazide [Hydrochlorothiazide W-Triamterene] Other (See Comments)    intolerance      Disposition: 01-Home or Self Care   Consults:  None    Significant Diagnostic Studies:  Dg Abdomen Acute W/chest  Result Date: 08/08/2016 CLINICAL DATA:  Nausea, vomiting. EXAM: DG ABDOMEN ACUTE W/ 1V CHEST COMPARISON:  Radiographs of July 26, 2015. FINDINGS: There is no evidence of dilated bowel loops or free intraperitoneal air. Phleboliths are noted in the pelvis. Heart size and mediastinal contours are within normal limits. Both lungs are clear. IMPRESSION: No evidence of bowel obstruction or ileus. No acute cardiopulmonary disease. Electronically Signed   By: Marijo Conception, M.D.   On: 08/08/2016 09:09      Filed Weights   08/08/16 0748 08/08/16 1708 08/08/16 2027  Weight: 122.5 kg (270 lb) 122.5 kg (270 lb) 127.4 kg (280 lb 12.8 oz)       Labs: Results for orders placed or performed during the hospital encounter of 08/08/16 (from the past 48 hour(s))  Lipase, blood     Status: None   Collection Time: 08/08/16  8:02 AM  Result Value Ref Range   Lipase 15 11 - 51 U/L  Comprehensive metabolic panel     Status: Abnormal   Collection Time: 08/08/16  8:02 AM  Result Value Ref Range   Sodium 138 135 - 145 mmol/L   Potassium 3.2 (L) 3.5 - 5.1 mmol/L   Chloride 100 (L) 101 - 111 mmol/L   CO2 22 22 - 32 mmol/L   Glucose, Bld 229 (H) 65 - 99 mg/dL   BUN 9 6 - 20 mg/dL   Creatinine, Ser 1.14 (H) 0.44 - 1.00 mg/dL   Calcium 9.8 8.9 - 10.3 mg/dL   Total Protein 8.5 (H) 6.5 - 8.1 g/dL   Albumin 4.4 3.5 - 5.0 g/dL   AST 21 15 - 41 U/L   ALT 16 14 - 54 U/L   Alkaline Phosphatase 114 38 - 126 U/L   Total Bilirubin 0.6  0.3 - 1.2 mg/dL   GFR calc non Af Amer 51 (L) >60 mL/min   GFR calc Af Amer 59 (L) >60 mL/min    Comment: (NOTE) The eGFR has been calculated using the CKD EPI equation. This calculation has not been validated in all clinical situations. eGFR's persistently <60 mL/min signify possible Chronic Kidney Disease.    Anion gap 16 (H) 5 - 15  CBC     Status: Abnormal   Collection Time: 08/08/16  8:02 AM  Result Value Ref Range   WBC 13.5 (H) 4.0 - 10.5 K/uL   RBC 5.20 (H)  3.87 - 5.11 MIL/uL   Hemoglobin 13.3 12.0 - 15.0 g/dL   HCT 40.7 36.0 - 46.0 %   MCV 78.3 78.0 - 100.0 fL   MCH 25.6 (L) 26.0 - 34.0 pg   MCHC 32.7 30.0 - 36.0 g/dL   RDW 17.0 (H) 11.5 - 15.5 %   Platelets 331 150 - 400 K/uL  Hemoglobin A1c     Status: Abnormal   Collection Time: 08/08/16  8:02 AM  Result Value Ref Range   Hgb A1c MFr Bld 7.2 (H) 4.8 - 5.6 %    Comment: (NOTE)         Pre-diabetes: 5.7 - 6.4         Diabetes: >6.4         Glycemic control for adults with diabetes: <7.0    Mean Plasma Glucose 160 mg/dL    Comment: (NOTE) Performed At: [facility] [place], [place] [id number] Lindon Romp MD ID:[id number]   POCT gastric occult blood(1-card to lab)     Status: Abnormal   Collection Time: 08/08/16  8:39 AM  Result Value Ref Range   pH, Gastric NOT DONE    Occult Blood, Gastric POSITIVE (A) NEGATIVE  Urinalysis, Routine w reflex microscopic     Status: Abnormal   Collection Time: 08/08/16 10:40 AM  Result Value Ref Range   Color, Urine STRAW (A) YELLOW   APPearance CLEAR CLEAR   Specific Gravity, Urine 1.011 1.005 - 1.030   pH 6.0 5.0 - 8.0   Glucose, UA 150 (A) NEGATIVE mg/dL   Hgb urine dipstick SMALL (A) NEGATIVE   Bilirubin Urine NEGATIVE NEGATIVE   Ketones, ur 20 (A) NEGATIVE mg/dL   Protein, ur 30 (A) NEGATIVE mg/dL   Nitrite NEGATIVE NEGATIVE   Leukocytes, UA NEGATIVE NEGATIVE   RBC / HPF 0-5 0 - 5 RBC/hpf   WBC, UA 0-5 0 - 5 WBC/hpf   Bacteria, UA RARE  (A) NONE SEEN   Squamous Epithelial / LPF 0-5 (A) NONE SEEN   Mucous PRESENT    Hyaline Casts, UA PRESENT   Glucose, capillary     Status: Abnormal   Collection Time: 08/08/16  5:25 PM  Result Value Ref Range   Glucose-Capillary 178 (H) 65 - 99 mg/dL   Comment 1 Notify RN   Glucose, capillary     Status: Abnormal   Collection Time: 08/08/16  8:32 PM  Result Value Ref Range   Glucose-Capillary 158 (H) 65 - 99 mg/dL  CBC     Status: Abnormal   Collection Time: 08/08/16  9:10 PM  Result Value Ref Range   WBC 12.2 (H) 4.0 - 10.5 K/uL   RBC 4.86 3.87 - 5.11 MIL/uL   Hemoglobin 12.4 12.0 - 15.0 g/dL   HCT 38.0 36.0 - 46.0 %   MCV 78.2 78.0 - 100.0 fL   MCH 25.5 (L) 26.0 - 34.0 pg   MCHC 32.6 30.0 - 36.0 g/dL   RDW 16.7 (H) 11.5 - 15.5 %   Platelets 310 150 - 400 K/uL  Basic metabolic panel     Status: Abnormal   Collection Time: 08/09/16  4:30 AM  Result Value Ref Range   Sodium 137 135 - 145 mmol/L   Potassium 3.3 (L) 3.5 - 5.1 mmol/L   Chloride 98 (L) 101 - 111 mmol/L   CO2 24 22 - 32 mmol/L   Glucose, Bld 174 (H) 65 - 99 mg/dL   BUN 11 6 - 20 mg/dL  Creatinine, Ser 0.91 0.44 - 1.00 mg/dL   Calcium 9.4 8.9 - 10.3 mg/dL   GFR calc non Af Amer >60 >60 mL/min   GFR calc Af Amer >60 >60 mL/min    Comment: (NOTE) The eGFR has been calculated using the CKD EPI equation. This calculation has not been validated in all clinical situations. eGFR's persistently <60 mL/min signify possible Chronic Kidney Disease.    Anion gap 15 5 - 15  CBC     Status: Abnormal   Collection Time: 08/09/16  4:30 AM  Result Value Ref Range   WBC 11.6 (H) 4.0 - 10.5 K/uL   RBC 5.05 3.87 - 5.11 MIL/uL   Hemoglobin 12.8 12.0 - 15.0 g/dL   HCT 40.0 36.0 - 46.0 %   MCV 79.2 78.0 - 100.0 fL   MCH 25.3 (L) 26.0 - 34.0 pg   MCHC 32.0 30.0 - 36.0 g/dL   RDW 17.1 (H) 11.5 - 15.5 %   Platelets 320 150 - 400 K/uL  Glucose, capillary     Status: Abnormal   Collection Time: 08/09/16  8:03 AM  Result Value  Ref Range   Glucose-Capillary 163 (H) 65 - 99 mg/dL     Lipid Panel  No results found for: CHOL, TRIG, HDL, CHOLHDL, VLDL, LDLCALC, LDLDIRECT   Lab Results  Component Value Date   HGBA1C 7.2 (H) 08/08/2016     Lab Results  Component Value Date   CREATININE 0.91 08/09/2016     HPI :*  pleasant 61 y.o. female with medical history significant for hypertension, diabetes presents to the emergency Department chief complaint persistent nausea and vomiting. Patient is one day status post colonoscopy had 2 episodes of blood-tinged emesis while in the emergency department. Hemoglobin is stable she is hemodynamically stable being admitted for observation.. Suspect some amount of mechanical irritation vs concurrent viral process causing gastroenteritis and hematemesis likely from violent emesis and likely gastric and esophageal irritation. No sign of large volume hemorrhage concerning for Mallory-Weiss tear.    HOSPITAL COURSE: *   #1. Gastroenteritis/vomiting. Status post colonoscopy yesterday. Patient states she was vomiting before leaving the office. History of same 2013 and 2015 which time it was thought she had a virus. Resolved itself with bowel rest. Abdominal xray without acute abnormality. Maybe related to colonoscopy prep in setting of anesthesia  for colonoscopy.   Status post EGD that showed esophagitis, was supposed to be on Protonix/Carafate/follow-up with Dr. Carol Ada in one month Patient has been recommended to follow-up with GI Hydrated with IV fluids, advance diet as tolerated with small frequent meals Continue Carafate, refills provided Continue PPI, baclofen started for hiccups   #2. Hematemesis. Blood tinged emesis 2 in the emergency department. Emesis positive for occult blood. Suspect this is due to esophagitis.  Continue treatment as above  3. Hypertension. Control in the emergency department. Home medications include atenolol, amlodipine, Lasix,  lisinopril -continue norvasc , lisinopril and atenolol Hold Lasix    4. Diabetes. Serum glucose 229 on admission. Medications include oral agents only -Hold oral agents for now Hemoglobin A1c 7.2    #5. Hypokalemia. Mild. Potassium level 3.2. Likely related to #1. In the setting of Lasix -Hold Lasix for today Repleted    #6. Acute renal failure. Mild. Creatinine 1.14. Likely related to #1 in the setting of Lasix. Hydrated with IV fluids   7.leukocytosis. WBC 13.5. Suspect viral gastroenteritis. Likely reactive. No indication of UTI, abdominal xray reveals clear lung bases. Afebrile and non-toxic appearing.  -  monitor   Discharge Exam:   Blood pressure (!) 163/82, pulse 81, temperature 98.2 F (36.8 C), temperature source Oral, resp. rate 18, height '5\' 10"'$  (1.778 m), weight 127.4 kg (280 lb 12.8 oz), SpO2 99 %.   Cardiovascular:  RRR, no m/r/g. No LE edema.   Respiratory:  CTA bilaterally, no w/r/r. Normal respiratory effort.  Abdomen:  soft, ntnd, obese positive bowel sounds but sluggish no guarding or rebounding  Skin:  no rash or induration seen on limited exam  Musculoskeletal:  grossly normal tone BUE/BLE, good ROM, no bony abnormality  Psychiatric:  grossly normal mood and affect, speech fluent and appropriate, AOx3  Neurologic:  CN 2-12 grossly intact, moves all extremities in coordinated fashion, sensation intact speech clear facial symmetry    Follow-up Information    PCP. Schedule an appointment as soon as possible for a visit.   Why:  Hospital follow-up       HUNG,PATRICK D, MD. Call.   Specialty:  Gastroenterology Why:  To make appointment, gastritis/esophagitis on recent EGD in January 2018 Contact information: [place], [place] [phone]           Signed: Reyne Dumas 08/09/2016, 8:39 AM        Time spent >45 mins

## 2016-08-09 NOTE — Progress Notes (Signed)
Patient was discharged to home, no complaints of N/V and v/s stable. AVS was reviewed and prescriptions had been sent to patients pharmacy. Patient to set up follow up appointment. IV and telemetry were d/c'd. Patient left the floor via wheelchair with volunteers.

## 2016-08-18 ENCOUNTER — Inpatient Hospital Stay (HOSPITAL_COMMUNITY)
Admission: EM | Admit: 2016-08-18 | Discharge: 2016-08-20 | DRG: 683 | Disposition: A | Discharge status: Home / Self Care | Payer: BC Managed Care – PPO | Attending: Internal Medicine | Admitting: Internal Medicine

## 2016-08-18 ENCOUNTER — Encounter (HOSPITAL_COMMUNITY): Payer: Self-pay | Admitting: *Deleted

## 2016-08-18 DIAGNOSIS — E785 Hyperlipidemia, unspecified: Secondary | ICD-10-CM | POA: Diagnosis present

## 2016-08-18 DIAGNOSIS — N179 Acute kidney failure, unspecified: Secondary | ICD-10-CM | POA: Diagnosis present

## 2016-08-18 DIAGNOSIS — T464X5A Adverse effect of angiotensin-converting-enzyme inhibitors, initial encounter: Secondary | ICD-10-CM | POA: Diagnosis present

## 2016-08-18 DIAGNOSIS — Z833 Family history of diabetes mellitus: Secondary | ICD-10-CM | POA: Diagnosis not present

## 2016-08-18 DIAGNOSIS — E039 Hypothyroidism, unspecified: Secondary | ICD-10-CM | POA: Diagnosis present

## 2016-08-18 DIAGNOSIS — E876 Hypokalemia: Secondary | ICD-10-CM | POA: Diagnosis present

## 2016-08-18 DIAGNOSIS — E119 Type 2 diabetes mellitus without complications: Secondary | ICD-10-CM | POA: Diagnosis present

## 2016-08-18 DIAGNOSIS — E7800 Pure hypercholesterolemia, unspecified: Secondary | ICD-10-CM | POA: Diagnosis present

## 2016-08-18 DIAGNOSIS — I10 Essential (primary) hypertension: Secondary | ICD-10-CM | POA: Diagnosis present

## 2016-08-18 DIAGNOSIS — Z6841 Body Mass Index (BMI) 40.0 and over, adult: Secondary | ICD-10-CM

## 2016-08-18 DIAGNOSIS — Z7982 Long term (current) use of aspirin: Secondary | ICD-10-CM

## 2016-08-18 DIAGNOSIS — E1169 Type 2 diabetes mellitus with other specified complication: Secondary | ICD-10-CM | POA: Diagnosis not present

## 2016-08-18 DIAGNOSIS — Z87891 Personal history of nicotine dependence: Secondary | ICD-10-CM | POA: Diagnosis not present

## 2016-08-18 DIAGNOSIS — E118 Type 2 diabetes mellitus with unspecified complications: Secondary | ICD-10-CM

## 2016-08-18 LAB — CBC WITH DIFFERENTIAL/PLATELET
BASOS ABS: 0 10*3/uL (ref 0.0–0.1)
Basophils Relative: 0 %
Eosinophils Absolute: 0.1 10*3/uL (ref 0.0–0.7)
Eosinophils Relative: 1 %
HCT: 33.6 % — ABNORMAL LOW (ref 36.0–46.0)
Hemoglobin: 11.2 g/dL — ABNORMAL LOW (ref 12.0–15.0)
LYMPHS ABS: 3.8 10*3/uL (ref 0.7–4.0)
Lymphocytes Relative: 47 %
MCH: 25.6 pg — ABNORMAL LOW (ref 26.0–34.0)
MCHC: 33.3 g/dL (ref 30.0–36.0)
MCV: 76.9 fL — AB (ref 78.0–100.0)
MONO ABS: 0.7 10*3/uL (ref 0.1–1.0)
Monocytes Relative: 9 %
NEUTROS ABS: 3.5 10*3/uL (ref 1.7–7.7)
Neutrophils Relative %: 43 %
PLATELETS: 270 10*3/uL (ref 150–400)
RBC: 4.37 MIL/uL (ref 3.87–5.11)
RDW: 17.1 % — AB (ref 11.5–15.5)
WBC: 8.1 10*3/uL (ref 4.0–10.5)

## 2016-08-18 LAB — COMPREHENSIVE METABOLIC PANEL
ALBUMIN: 3.7 g/dL (ref 3.5–5.0)
ALT: 20 U/L (ref 14–54)
ANION GAP: 13 (ref 5–15)
AST: 19 U/L (ref 15–41)
Alkaline Phosphatase: 93 U/L (ref 38–126)
BILIRUBIN TOTAL: 0.2 mg/dL — AB (ref 0.3–1.2)
BUN: 87 mg/dL — AB (ref 6–20)
CHLORIDE: 105 mmol/L (ref 101–111)
CO2: 19 mmol/L — ABNORMAL LOW (ref 22–32)
Calcium: 9.4 mg/dL (ref 8.9–10.3)
Creatinine, Ser: 2.83 mg/dL — ABNORMAL HIGH (ref 0.44–1.00)
GFR calc Af Amer: 20 mL/min — ABNORMAL LOW (ref >60)
GFR, EST NON AFRICAN AMERICAN: 17 mL/min — AB (ref >60)
Glucose, Bld: 115 mg/dL — ABNORMAL HIGH (ref 65–99)
POTASSIUM: 4.2 mmol/L (ref 3.5–5.1)
Sodium: 137 mmol/L (ref 135–145)
TOTAL PROTEIN: 6.9 g/dL (ref 6.5–8.1)

## 2016-08-18 LAB — GLUCOSE, CAPILLARY: Glucose-Capillary: 100 mg/dL — ABNORMAL HIGH (ref 65–99)

## 2016-08-18 LAB — LIPASE, BLOOD: LIPASE: 28 U/L (ref 11–51)

## 2016-08-18 MED ORDER — SODIUM CHLORIDE 0.9 % IV BOLUS (SEPSIS)
1000.0000 mL | Freq: Once | INTRAVENOUS | Status: AC
  Administered 2016-08-18: 1000 mL via INTRAVENOUS

## 2016-08-18 MED ORDER — AMLODIPINE BESYLATE 10 MG PO TABS
10.0000 mg | ORAL_TABLET | Freq: Every day | ORAL | Status: DC
  Administered 2016-08-19 – 2016-08-20 (×2): 10 mg via ORAL
  Filled 2016-08-18: qty 1
  Filled 2016-08-18: qty 2

## 2016-08-18 MED ORDER — HEPARIN SODIUM (PORCINE) 5000 UNIT/ML IJ SOLN
5000.0000 [IU] | Freq: Three times a day (TID) | INTRAMUSCULAR | Status: DC
  Administered 2016-08-18 – 2016-08-19 (×4): 5000 [IU] via SUBCUTANEOUS
  Filled 2016-08-18 (×4): qty 1

## 2016-08-18 MED ORDER — ASPIRIN 81 MG PO CHEW
81.0000 mg | CHEWABLE_TABLET | Freq: Every day | ORAL | Status: DC
  Administered 2016-08-19 – 2016-08-20 (×2): 81 mg via ORAL
  Filled 2016-08-18 (×2): qty 1

## 2016-08-18 MED ORDER — BIOTIN 1000 MCG PO TABS: 1000.0000 ug | ORAL_TABLET | Freq: Every day | ORAL | Status: DC

## 2016-08-18 MED ORDER — LEVOTHYROXINE SODIUM 50 MCG PO TABS
175.0000 ug | ORAL_TABLET | Freq: Every day | ORAL | Status: DC
  Administered 2016-08-19 – 2016-08-20 (×2): 175 ug via ORAL
  Filled 2016-08-18 (×3): qty 1

## 2016-08-18 MED ORDER — SUCRALFATE 1 GM/10ML PO SUSP
1.0000 g | Freq: Three times a day (TID) | ORAL | Status: DC
Start: 2016-08-18 — End: 2016-08-20
  Administered 2016-08-18 – 2016-08-20 (×6): 1 g via ORAL
  Filled 2016-08-18 (×5): qty 10

## 2016-08-18 MED ORDER — SODIUM CHLORIDE 0.9 % IV SOLN
INTRAVENOUS | Status: DC
  Administered 2016-08-19 – 2016-08-20 (×4): via INTRAVENOUS

## 2016-08-18 MED ORDER — PANTOPRAZOLE SODIUM 40 MG PO TBEC
40.0000 mg | DELAYED_RELEASE_TABLET | Freq: Every day | ORAL | Status: DC
  Administered 2016-08-19 – 2016-08-20 (×2): 40 mg via ORAL
  Filled 2016-08-18 (×2): qty 1

## 2016-08-18 MED ORDER — AMLODIPINE BESYLATE 5 MG PO TABS: 10.0000 mg | ORAL_TABLET | Freq: Every day | ORAL | Status: DC

## 2016-08-18 MED ORDER — INSULIN ASPART 100 UNIT/ML ~~LOC~~ SOLN
0.0000 [IU] | Freq: Three times a day (TID) | SUBCUTANEOUS | Status: DC
  Administered 2016-08-19 – 2016-08-20 (×3): 1 [IU] via SUBCUTANEOUS

## 2016-08-18 MED ORDER — SIMVASTATIN 20 MG PO TABS
20.0000 mg | ORAL_TABLET | Freq: Every day | ORAL | Status: DC
  Administered 2016-08-19: 20 mg via ORAL
  Filled 2016-08-18: qty 1

## 2016-08-18 MED ORDER — ATENOLOL 25 MG PO TABS
25.0000 mg | ORAL_TABLET | Freq: Every day | ORAL | Status: DC
  Administered 2016-08-19 – 2016-08-20 (×2): 25 mg via ORAL
  Filled 2016-08-18 (×2): qty 1

## 2016-08-18 MED ORDER — ACETAMINOPHEN 500 MG PO TABS: 500.0000 mg | ORAL_TABLET | Freq: Four times a day (QID) | ORAL | Status: DC | PRN

## 2016-08-18 NOTE — ED Notes (Signed)
Delay in lab draw,  EDP examinaning pt at this time.

## 2016-08-18 NOTE — ED Notes (Signed)
Attempted IV stick x 2  

## 2016-08-18 NOTE — ED Provider Notes (Signed)
[facility] DEPT Provider Note   CSN: [id number] Arrival date & time: 08/18/16  [id number]     History   Chief Complaint No chief complaint on file.   HPI Sara [name] is a 61 y.o. female. Patient with history of hypertension, hyperlipidemia, and hypothyroidism presents due to abnormal lab. Patient had colonoscopy for routine screening on 08/07/2016. She had emesis and diarrhea following that procedure. She was admitted here from 213 until 214 for hematemesis. Her vomiting resolved at that time but her diarrhea continued for a couple days. Since then, she has been experiencing decreased appetite and fluid intake. She reports she has felt generally weak since her colonoscopy. She had follow-up with her PCP today who drew labs and call the patient back due to "abnormal kidney labs." Patient denies any dysuria. She has gone eating and drinking again. She denies any pain. No ongoing hematemesis or blood in her stool. She denies any chest pain or shortness of breath.  HPI  Past Medical History:  Diagnosis Date  . High cholesterol   . Hypertension   . Hypothyroidism   . Nausea and vomiting since 08/07/2016  . Type II diabetes mellitus [facility])     Patient Active Problem List   Diagnosis Date Noted  . Acute kidney failure ([place]) 08/18/2016  . Hematemesis 08/08/2016  . Leukocytosis 08/08/2016  . Hypokalemia 08/08/2016  . Acute gastritis   . Diabetes mellitus with complication ([place])   . Nausea vomiting and diarrhea 07/27/2015  . Hypertension, uncontrolled 07/27/2015  . Diabetes mellitus type 2, controlled ([place]) 07/27/2015  . Vomiting 07/27/2015  . Gastroenteritis 07/04/2011    Class: Acute  . Hypothyroidism 07/04/2011  . HTN (hypertension) 07/04/2011    Past Surgical History:  Procedure Laterality Date  . COLONOSCOPY  08/07/2016  . DILATION AND CURETTAGE OF UTERUS    . ESOPHAGOGASTRODUODENOSCOPY  07/06/2011   Procedure: ESOPHAGOGASTRODUODENOSCOPY (EGD);  Surgeon: Beryle Beams,  MD;  Location: [facility] ENDOSCOPY;  Service: Endoscopy;  Laterality: N/A;  . OOPHORECTOMY Right 1990s   "& cyst w/it"  . TOTAL THYROIDECTOMY  2006    OB History    No data available       Home Medications    Prior to Admission medications   Medication Sig Start Date End Date Taking? Authorizing Provider  acetaminophen (TYLENOL) 500 MG chewable tablet Chew 500 mg by mouth every 6 (six) hours as needed for pain.   Yes Historical Provider, MD  amLODipine (NORVASC) 10 MG tablet Take 10 mg by mouth daily.     Yes Historical Provider, MD  aspirin 81 MG chewable tablet Chew 81 mg by mouth daily.   Yes Historical Provider, MD  Astaxanthin 4 MG CAPS Take 4 mg by mouth daily.   Yes Historical Provider, MD  Biotin 1000 MCG tablet Take 1,000 mcg by mouth daily.   Yes Historical Provider, MD  Calcium-Magnesium-Vitamin D (CITRACAL CALCIUM+D) 600-40-500 MG-MG-UNIT TB24 Take 2 tablets by mouth daily.    Yes Historical Provider, MD  Cholecalciferol (VITAMIN D3) 2000 UNITS TABS Take 1 tablet by mouth daily.     Yes Historical Provider, MD  furosemide (LASIX) 20 MG tablet Take 20 mg by mouth daily.   Yes Historical Provider, MD  levothyroxine (SYNTHROID, LEVOTHROID) 175 MCG tablet Take 175 mcg by mouth daily before breakfast.   Yes Historical Provider, MD  lisinopril (PRINIVIL,ZESTRIL) 20 MG tablet Take 20 mg by mouth daily.   Yes Historical Provider, MD  pantoprazole (PROTONIX) 40 MG tablet Take 1 tablet (  40 mg total) by mouth daily. 08/09/16 08/09/17 Yes Reyne Dumas, MD  potassium chloride (K-DUR,KLOR-CON) 10 MEQ tablet Take 10 mEq by mouth daily.   Yes Historical Provider, MD  simvastatin (ZOCOR) 20 MG tablet Take 20 mg by mouth daily.   Yes Historical Provider, MD  sitaGLIPtin (JANUVIA) 100 MG tablet Take 100 mg by mouth daily.   Yes Historical Provider, MD  sucralfate (CARAFATE) 1 GM/10ML suspension Take 10 mLs (1 g total) by mouth 4 (four) times daily -  with meals and at bedtime. 08/09/16  Yes Reyne Dumas, MD  pantoprazole (PROTONIX) 40 MG tablet Take 1 tablet (40 mg total) by mouth daily. 07/08/11 07/07/12  Nita Sells, MD    Family History Family History  Problem Relation Age of Onset  . Diabetes Mellitus II Mother     Social History Social History  Substance Use Topics  . Smoking status: Former Smoker    Packs/day: 0.50    Years: 12.00    Types: Cigarettes    Quit date: 57  . Smokeless tobacco: Never Used  . Alcohol use 0.6 oz/week    1 Cans of beer per week     Allergies   Dyazide [hydrochlorothiazide w-triamterene]   Review of Systems Review of Systems  Constitutional: Negative for chills and fever.  HENT: Negative for ear pain and sore throat.   Eyes: Negative for pain and visual disturbance.  Respiratory: Negative for cough and shortness of breath.   Cardiovascular: Negative for chest pain and palpitations.  Gastrointestinal: Negative for abdominal pain and vomiting.  Genitourinary: Negative for dysuria and hematuria.  Musculoskeletal: Negative for arthralgias and back pain.  Skin: Negative for color change and rash.  Neurological: Positive for weakness. Negative for seizures and syncope.  All other systems reviewed and are negative.    Physical Exam Updated Vital Signs BP (!) 106/53 (BP Location: Right Arm)   Pulse (!) 56   Temp 98.2 F (36.8 C) (Oral)   Resp 18   Ht 5\' 9"  (1.753 m)   Wt 129.7 kg   SpO2 100%   BMI 42.23 kg/m   Physical Exam  Constitutional: She is oriented to person, place, and time. She appears well-developed and well-nourished. No distress.  HENT:  Head: Normocephalic and atraumatic.  Eyes: Conjunctivae are normal.  Neck: Neck supple.  Cardiovascular: Normal rate and regular rhythm.   No murmur heard. Pulmonary/Chest: Effort normal and breath sounds normal. No respiratory distress.  Abdominal: Soft. She exhibits no distension and no mass. There is no tenderness. There is no rebound and no guarding.    Musculoskeletal: She exhibits no edema, tenderness or deformity.  Neurological: She is alert and oriented to person, place, and time. No cranial nerve deficit.  Normal strength  Skin: Skin is warm and dry.  Psychiatric: She has a normal mood and affect.  Nursing note and vitals reviewed.    ED Treatments / Results  Labs (all labs ordered are listed, but only abnormal results are displayed) Labs Reviewed  CBC WITH DIFFERENTIAL/PLATELET - Abnormal; Notable for the following:       Result Value   Hemoglobin 11.2 (*)    HCT 33.6 (*)    MCV 76.9 (*)    MCH 25.6 (*)    RDW 17.1 (*)    All other components within normal limits  COMPREHENSIVE METABOLIC PANEL - Abnormal; Notable for the following:    CO2 19 (*)    Glucose, Bld 115 (*)    BUN 87 (*)  Creatinine, Ser 2.83 (*)    Total Bilirubin 0.2 (*)    GFR calc non Af Amer 17 (*)    GFR calc Af Amer 20 (*)    All other components within normal limits  GLUCOSE, CAPILLARY - Abnormal; Notable for the following:    Glucose-Capillary 100 (*)    All other components within normal limits  LIPASE, BLOOD  BASIC METABOLIC PANEL  URINALYSIS, ROUTINE W REFLEX MICROSCOPIC  SODIUM, URINE, RANDOM  CREATININE, URINE, RANDOM  HIV ANTIBODY (ROUTINE TESTING)    EKG  EKG Interpretation None       Radiology No results found.  Procedures Procedures (including critical care time)  Medications Ordered in ED Medications  simvastatin (ZOCOR) tablet 20 mg (not administered)  sucralfate (CARAFATE) 1 GM/10ML suspension 1 g (1 g Oral Given 08/18/16 2236)  aspirin chewable tablet 81 mg (not administered)  atenolol (TENORMIN) tablet 25 mg (not administered)  levothyroxine (SYNTHROID, LEVOTHROID) tablet 175 mcg (not administered)  acetaminophen (TYLENOL) tablet 500 mg (not administered)  pantoprazole (PROTONIX) EC tablet 40 mg (not administered)  amLODipine (NORVASC) tablet 10 mg (not administered)  0.9 %  sodium chloride infusion (not  administered)  heparin injection 5,000 Units (5,000 Units Subcutaneous Given 08/18/16 2237)  insulin aspart (novoLOG) injection 0-9 Units (not administered)  sodium chloride 0.9 % bolus 1,000 mL (1,000 mLs Intravenous New Bag/Given 08/18/16 2155)     Initial Impression / Assessment and Plan / ED Course  I have reviewed the triage vital signs and the nursing notes.  Pertinent labs & imaging results that were available during my care of the patient were reviewed by me and considered in my medical decision making (see chart for details).    Patient is a pleasant 61 year old female with history as above who presents due to abnormal lab. She had a colonoscopy on February 12 of this year. Admitted here on 412 13th and 14th for nausea and vomiting. Her vomiting resolved, however she kept having some diarrhea. She had follow-up with her PCP today who drew labs and sent her here for acute kidney injury. We did not have those lab values they were repeated here. Her creatinine has tripled in the last 10 days. Patient will be admitted to medicine for further treatment of her AKI. Normal saline bolus given in the ED.  Final Clinical Impressions(s) / ED Diagnoses   Final diagnoses:  Acute kidney injury [facility])    New Prescriptions Current Discharge Medication List       Clifton James, MD 08/19/16 0000    Elnora Morrison, MD 08/20/16 [id number]

## 2016-08-18 NOTE — ED Notes (Signed)
Admitting at bedside 

## 2016-08-18 NOTE — H&P (Signed)
History and Physical    Sara [name] [id number] DOB: 29-Feb-1956 DOA: 08/18/2016   PCP: [facility] Chief Complaint: No chief complaint on file.   HPI: Sara [name] is a 61 y.o. female with medical history significant of DM2, HTN.  Patient was recently admitted to our service from 2/13 to 2/14 with gastroenteritis following a colonoscopy.  Her N/V resolved during the admission, Diarrhea continued for 4-5 days after admission but has also resolved.  Since then she has been feeling weak with poor PO intake and fatigue.  Symptoms are persistent, nothing makes better or worse.  Went to PCP for follow up labs which were drawn and showed AKI.  Patient was sent in to the ED today.  ED Course: BUN 87, creat of 2.8 today up from 11 and 0.91 on the 14th at discharge.  Review of Systems: As per HPI otherwise 10 point review of systems negative.    Past Medical History:  Diagnosis Date  . High cholesterol   . Hypertension   . Hypothyroidism   . Nausea and vomiting since 08/07/2016  . Type II diabetes mellitus ([place])     Past Surgical History:  Procedure Laterality Date  . COLONOSCOPY  08/07/2016  . DILATION AND CURETTAGE OF UTERUS    . ESOPHAGOGASTRODUODENOSCOPY  07/06/2011   Procedure: ESOPHAGOGASTRODUODENOSCOPY (EGD);  Surgeon: Beryle Beams, MD;  Location: [facility] ENDOSCOPY;  Service: Endoscopy;  Laterality: N/A;  . OOPHORECTOMY Right 1990s   "& cyst w/it"  . TOTAL THYROIDECTOMY  2006     reports that she quit smoking about 27 years ago. Her smoking use included Cigarettes. She has a 6.00 pack-year smoking history. She has never used smokeless tobacco. She reports that she drinks about 0.6 oz of alcohol per week . She reports that she does not use drugs.  Allergies  Allergen Reactions  . Dyazide [Hydrochlorothiazide W-Triamterene] Other (See Comments)    intolerance    Family History  Problem Relation Age of Onset  . Diabetes Mellitus II Mother        Prior to Admission medications   Medication Sig Start Date End Date Taking? Authorizing Provider  acetaminophen (TYLENOL) 500 MG chewable tablet Chew 500 mg by mouth every 6 (six) hours as needed for pain.    Historical Provider, MD  amLODipine (NORVASC) 10 MG tablet Take 10 mg by mouth daily.      Historical Provider, MD  aspirin 81 MG chewable tablet Chew 81 mg by mouth daily.    Historical Provider, MD  Astaxanthin 4 MG CAPS Take 4 mg by mouth daily.    Historical Provider, MD  atenolol (TENORMIN) 25 MG tablet Take 25 mg by mouth daily. 07/24/16   Historical Provider, MD  Biotin 1000 MCG tablet Take 1,000 mcg by mouth daily.    Historical Provider, MD  Calcium Carbonate-Vitamin D (CALCIUM + D PO) Take 2 tablets by mouth daily.      Historical Provider, MD  Calcium-Magnesium-Vitamin D (CITRACAL CALCIUM+D) 600-40-500 MG-MG-UNIT TB24 Take 1 tablet by mouth daily.    Historical Provider, MD  Cholecalciferol (VITAMIN D3) 2000 UNITS TABS Take 1 tablet by mouth daily.      Historical Provider, MD  levothyroxine (SYNTHROID, LEVOTHROID) 175 MCG tablet Take 175 mcg by mouth daily before breakfast.    Historical Provider, MD  lisinopril (PRINIVIL,ZESTRIL) 20 MG tablet Take 20 mg by mouth daily.    Historical Provider, MD  pantoprazole (PROTONIX) 40 MG tablet Take 1 tablet (40  mg total) by mouth daily. 07/08/11 07/07/12  Nita Sells, MD  pantoprazole (PROTONIX) 40 MG tablet Take 1 tablet (40 mg total) by mouth daily. 08/09/16 08/09/17  Reyne Dumas, MD  potassium chloride (K-DUR,KLOR-CON) 10 MEQ tablet Take 10 mEq by mouth daily.    Historical Provider, MD  simvastatin (ZOCOR) 20 MG tablet Take 20 mg by mouth daily.    Historical Provider, MD  sitaGLIPtin (JANUVIA) 100 MG tablet Take 100 mg by mouth daily.    Historical Provider, MD  sucralfate (CARAFATE) 1 GM/10ML suspension Take 10 mLs (1 g total) by mouth 4 (four) times daily -  with meals and at bedtime. 08/09/16   Reyne Dumas, MD     Physical Exam: Vitals:   08/18/16 1825 08/18/16 1830  BP: 136/76   Pulse: 63   Resp: 20   Temp: 97.6 F (36.4 C)   TempSrc: Oral   SpO2: 100%   Weight:  129.7 kg (286 lb)  Height:  5\' 9"  (1.753 m)      Constitutional: NAD, calm, comfortable Eyes: PERRL, lids and conjunctivae normal ENMT: Mucous membranes are moist. Posterior pharynx clear of any exudate or lesions.Normal dentition.  Neck: normal, supple, no masses, no thyromegaly Respiratory: clear to auscultation bilaterally, no wheezing, no crackles. Normal respiratory effort. No accessory muscle use.  Cardiovascular: Regular rate and rhythm, no murmurs / rubs / gallops. No extremity edema. 2+ pedal pulses. No carotid bruits.  Abdomen: no tenderness, no masses palpated. No hepatosplenomegaly. Bowel sounds positive.  Musculoskeletal: no clubbing / cyanosis. No joint deformity upper and lower extremities. Good ROM, no contractures. Normal muscle tone.  Skin: no rashes, lesions, ulcers. No induration Neurologic: CN 2-12 grossly intact. Sensation intact, DTR normal. Strength 5/5 in all 4.  Psychiatric: Normal judgment and insight. Alert and oriented x 3. Normal mood.    Labs on Admission: I have personally reviewed following labs and imaging studies  CBC:  Recent Labs Lab 08/18/16 1923  WBC 8.1  NEUTROABS 3.5  HGB 11.2*  HCT 33.6*  MCV 76.9*  PLT [id number]   Basic Metabolic Panel:  Recent Labs Lab 08/18/16 1923  NA 137  K 4.2  CL 105  CO2 19*  GLUCOSE 115*  BUN 87*  CREATININE 2.83*  CALCIUM 9.4   GFR: Estimated Creatinine Clearance: 30.6 mL/min (by C-G formula based on SCr of 2.83 mg/dL (H)). Liver Function Tests:  Recent Labs Lab 08/18/16 1923  AST 19  ALT 20  ALKPHOS 93  BILITOT 0.2*  PROT 6.9  ALBUMIN 3.7    Recent Labs Lab 08/18/16 1923  LIPASE 28   No results for input(s): AMMONIA in the last 168 hours. Coagulation Profile: No results for input(s): INR, PROTIME in the last 168  hours. Cardiac Enzymes: No results for input(s): CKTOTAL, CKMB, CKMBINDEX, TROPONINI in the last 168 hours. BNP (last 3 results) No results for input(s): PROBNP in the last 8760 hours. HbA1C: No results for input(s): HGBA1C in the last 72 hours. CBG: No results for input(s): GLUCAP in the last 168 hours. Lipid Profile: No results for input(s): CHOL, HDL, LDLCALC, TRIG, CHOLHDL, LDLDIRECT in the last 72 hours. Thyroid Function Tests: No results for input(s): TSH, T4TOTAL, FREET4, T3FREE, THYROIDAB in the last 72 hours. Anemia Panel: No results for input(s): VITAMINB12, FOLATE, FERRITIN, TIBC, IRON, RETICCTPCT in the last 72 hours. Urine analysis:    Component Value Date/Time   COLORURINE STRAW (A) 08/08/2016 1040   APPEARANCEUR CLEAR 08/08/2016 1040   LABSPEC 1.011 08/08/2016 1040  PHURINE 6.0 08/08/2016 1040   GLUCOSEU 150 (A) 08/08/2016 1040   HGBUR SMALL (A) 08/08/2016 1040   BILIRUBINUR NEGATIVE 08/08/2016 1040   KETONESUR 20 (A) 08/08/2016 1040   PROTEINUR 30 (A) 08/08/2016 1040   UROBILINOGEN 0.2 07/07/2011 0808   NITRITE NEGATIVE 08/08/2016 1040   LEUKOCYTESUR NEGATIVE 08/08/2016 1040   Sepsis Labs: @LABRCNTIP (procalcitonin:4,lacticidven:4) )No results found for this or any previous visit (from the past 240 hour(s)).   Radiological Exams on Admission: No results found.  EKG: Independently reviewed.  Assessment/Plan Principal Problem:   Acute kidney failure (HCC) Active Problems:   HTN (hypertension)   Diabetes mellitus with complication ([place])    1. Acute renal failure - given HPI and BUN/CREAT ratio, strongly suggestive of pre-renal etiology 1. IVF: 1L bolus in ED then 150 cc/hr 2. Strict intake and output 3. Repeat BMP in AM 4. Urine sodium and creat to calculate FeNa 5. UA pending 6. If no rapid improvement then next steps will be: 1. US Renal 2. Nephrology consult 2. HTN - 1. Holding lisinopril 2. Continue amlodipine and beta blocker 3. DM2  - 1. Hold home PO hypoglycemics 2. Sensitive scale SSI AC   DVT prophylaxis: Heparin Code Status: Full Family Communication: Husband at bedside Consults called: None Admission status: Admit to inpatient   Etta Quill DO [facility] Pager [phone] from 7PM-7AM  If 7AM-7PM, please contact the day physician for the patient [email or web address] Password [facility]  08/18/2016, 9:34 PM

## 2016-08-18 NOTE — ED Triage Notes (Signed)
Pt seen for f/u after being admitted post colonoscopy procedure 08/07/16 for weakness, emesis & diarrhea, pt reports no n/v/d for 4 days, pt c/o weakness, pt sent here for eval d/t "abnormal kidney labs" at todays visit, A&O x4, denies pain

## 2016-08-19 ENCOUNTER — Encounter (HOSPITAL_COMMUNITY): Payer: Self-pay | Admitting: *Deleted

## 2016-08-19 DIAGNOSIS — E785 Hyperlipidemia, unspecified: Secondary | ICD-10-CM

## 2016-08-19 DIAGNOSIS — E1169 Type 2 diabetes mellitus with other specified complication: Secondary | ICD-10-CM | POA: Diagnosis present

## 2016-08-19 DIAGNOSIS — E876 Hypokalemia: Secondary | ICD-10-CM

## 2016-08-19 LAB — BASIC METABOLIC PANEL
ANION GAP: 11 (ref 5–15)
BUN: 63 mg/dL — AB (ref 6–20)
CO2: 18 mmol/L — AB (ref 22–32)
Calcium: 8.8 mg/dL — ABNORMAL LOW (ref 8.9–10.3)
Chloride: 111 mmol/L (ref 101–111)
Creatinine, Ser: 1.86 mg/dL — ABNORMAL HIGH (ref 0.44–1.00)
GFR calc Af Amer: 33 mL/min — ABNORMAL LOW (ref >60)
GFR calc non Af Amer: 28 mL/min — ABNORMAL LOW (ref >60)
Glucose, Bld: 105 mg/dL — ABNORMAL HIGH (ref 65–99)
POTASSIUM: 3.4 mmol/L — AB (ref 3.5–5.1)
SODIUM: 140 mmol/L (ref 135–145)

## 2016-08-19 LAB — URINALYSIS, ROUTINE W REFLEX MICROSCOPIC
BILIRUBIN URINE: NEGATIVE
Glucose, UA: NEGATIVE mg/dL
Hgb urine dipstick: NEGATIVE
Ketones, ur: NEGATIVE mg/dL
Leukocytes, UA: NEGATIVE
NITRITE: NEGATIVE
PH: 5 (ref 5.0–8.0)
Protein, ur: NEGATIVE mg/dL
SPECIFIC GRAVITY, URINE: 1.013 (ref 1.005–1.030)

## 2016-08-19 LAB — GLUCOSE, CAPILLARY
GLUCOSE-CAPILLARY: 131 mg/dL — AB (ref 65–99)
GLUCOSE-CAPILLARY: 147 mg/dL — AB (ref 65–99)
GLUCOSE-CAPILLARY: 118 mg/dL — AB (ref 65–99)
Glucose-Capillary: 112 mg/dL — ABNORMAL HIGH (ref 65–99)

## 2016-08-19 LAB — SODIUM, URINE, RANDOM: Sodium, Ur: <10 mmol/L

## 2016-08-19 LAB — HIV ANTIBODY (ROUTINE TESTING W REFLEX): HIV Screen 4th Generation wRfx: NONREACTIVE

## 2016-08-19 LAB — CREATININE, URINE, RANDOM: CREATININE, URINE: 108.72 mg/dL

## 2016-08-19 MED ORDER — POTASSIUM CHLORIDE CRYS ER 20 MEQ PO TBCR
40.0000 meq | EXTENDED_RELEASE_TABLET | Freq: Once | ORAL | Status: AC
  Administered 2016-08-19: 40 meq via ORAL
  Filled 2016-08-19: qty 2

## 2016-08-19 NOTE — Progress Notes (Signed)
Patient ID: Sara [name], female   DOB: 03/26/1956, 61 y.o.   MRN: [id number]  PROGRESS NOTE    Sara [name]  [id number] DOB: 1955-10-18 DOA: 08/18/2016  PCP: [facility]   Brief Narrative:  61 y.o. female with medical history significant for DM2, HTN., recently hospitalized 2/13-2/14 for gastroenteritis following colonoscopy. While N/V resolved, diarrhea continued for 4-5 days after admission but has also eventually resolved.  Since then she has been feeling weak with poor PO intake and fatigue. She went to PCP and was found to have acute kidney injury and was referred to ED for evaluation.   Creatinine was 2.8 on this admission. She was started on IV fluids with improvement in Cr.   Assessment & Plan:   Principal Problem:   Acute kidney failure ([place]) - Likely prerenal etiology in the setting of recent viral gastroenteritis and possibly lisinopril - Lisinopril on hold  - Her Cr has already improved since admission from 2.8 to 1.8 - Continue IV fluids for next 24 hours - Repeat Cr in am  Active Problems:   Hypokalemia - Due to GI losses - Supplemented - Follow up BMP in am    Hypothyroidism - Continue synthroid    Diabetes mellitus without complications without long term insulin use - A1c 7.2 on 08/08/16 indicating good glycemic control - Continue SSI - At home, takes Januvia     Dyslipidemia associated with type 2 DM - Continue statin therapy    Essential hypertension - Resumed atenolol and Norvasc  - Will hold lisinopril for now until Cr normalizes    DVT prophylaxis: Heparin subQ Code Status: full code  Family Communication: no family at the bedside this am Disposition Plan: home in am if Cr further improving    Consultants:   None   Procedures:   None   Antimicrobials:   None    Subjective: Feels better this am.  Objective: Vitals:   08/18/16 2230 08/18/16 2300 08/19/16 0451 08/19/16 1027  BP: 107/59 (!)  106/53 (!) 104/59 119/65  Pulse: (!) 53 (!) 56 (!) 57 61  Resp: 14 18 16 18   Temp:  98.2 F (36.8 C) 98.4 F (36.9 C) 98.4 F (36.9 C)  TempSrc:  Oral Oral Oral  SpO2: 99% 100% 98% 99%  Weight:  129.7 kg (286 lb)    Height:        Intake/Output Summary (Last 24 hours) at 08/19/16 1137 Last data filed at 08/19/16 1029  Gross per 24 hour  Intake             1585 ml  Output              575 ml  Net             1010 ml   Filed Weights   08/18/16 1830 08/18/16 2300  Weight: 129.7 kg (286 lb) 129.7 kg (286 lb)    Examination:  General exam: Appears calm and comfortable  Respiratory system: Clear to auscultation. Respiratory effort normal. Cardiovascular system: S1 & S2 heard, Rate controlled  Gastrointestinal system: Abdomen is nondistended, soft and nontender. No organomegaly or masses felt. Normal bowel sounds heard. Central nervous system: Alert and oriented. No focal neurological deficits. Extremities: Symmetric 5 x 5 power. Trace LE pitting edema Skin: No rashes, lesions or ulcers Psychiatry: Judgement and insight appear normal. Mood & affect appropriate.   Data Reviewed: I have personally reviewed following labs and imaging studies  CBC:  Recent  Labs Lab 08/18/16 1923  WBC 8.1  NEUTROABS 3.5  HGB 11.2*  HCT 33.6*  MCV 76.9*  PLT [id number]   Basic Metabolic Panel:  Recent Labs Lab 08/18/16 1923 08/19/16 0516  NA 137 140  K 4.2 3.4*  CL 105 111  CO2 19* 18*  GLUCOSE 115* 105*  BUN 87* 63*  CREATININE 2.83* 1.86*  CALCIUM 9.4 8.8*   GFR: Estimated Creatinine Clearance: 46.5 mL/min (by C-G formula based on SCr of 1.86 mg/dL (H)). Liver Function Tests:  Recent Labs Lab 08/18/16 1923  AST 19  ALT 20  ALKPHOS 93  BILITOT 0.2*  PROT 6.9  ALBUMIN 3.7    Recent Labs Lab 08/18/16 1923  LIPASE 28   No results for input(s): AMMONIA in the last 168 hours. Coagulation Profile: No results for input(s): INR, PROTIME in the last 168 hours. Cardiac  Enzymes: No results for input(s): CKTOTAL, CKMB, CKMBINDEX, TROPONINI in the last 168 hours. BNP (last 3 results) No results for input(s): PROBNP in the last 8760 hours. HbA1C: No results for input(s): HGBA1C in the last 72 hours. CBG:  Recent Labs Lab 08/18/16 2346 08/19/16 0747  GLUCAP 100* 131*   Lipid Profile: No results for input(s): CHOL, HDL, LDLCALC, TRIG, CHOLHDL, LDLDIRECT in the last 72 hours. Thyroid Function Tests: No results for input(s): TSH, T4TOTAL, FREET4, T3FREE, THYROIDAB in the last 72 hours. Anemia Panel: No results for input(s): VITAMINB12, FOLATE, FERRITIN, TIBC, IRON, RETICCTPCT in the last 72 hours. Urine analysis:    Component Value Date/Time   COLORURINE STRAW (A) 08/19/2016 0015   APPEARANCEUR CLEAR 08/19/2016 0015   LABSPEC 1.013 08/19/2016 0015   PHURINE 5.0 08/19/2016 0015   GLUCOSEU NEGATIVE 08/19/2016 0015   HGBUR NEGATIVE 08/19/2016 0015   BILIRUBINUR NEGATIVE 08/19/2016 0015   KETONESUR NEGATIVE 08/19/2016 0015   PROTEINUR NEGATIVE 08/19/2016 0015   UROBILINOGEN 0.2 07/07/2011 0808   NITRITE NEGATIVE 08/19/2016 0015   LEUKOCYTESUR NEGATIVE 08/19/2016 0015   Sepsis Labs: @LABRCNTIP (procalcitonin:4,lacticidven:4)   )No results found for this or any previous visit (from the past 240 hour(s)).    Radiology Studies: No results found.   Scheduled Meds: . amLODipine  10 mg Oral Daily  . aspirin  81 mg Oral Daily  . atenolol  25 mg Oral Daily  . heparin  5,000 Units Subcutaneous Q8H  . insulin aspart  0-9 Units Subcutaneous TID WC  . levothyroxine  175 mcg Oral QAC breakfast  . pantoprazole  40 mg Oral Daily  . simvastatin  20 mg Oral q1800  . sucralfate  1 g Oral TID WC & HS   Continuous Infusions: . sodium chloride 100 mL/hr at 08/19/16 0959     LOS: 1 day    Time spent: 25 minutes  Greater than 50% of the time spent on counseling and coordinating the care.   Leisa Lenz, MD [facility] Pager  [phone]  If 7PM-7AM, please contact night-coverage [email or web address] Password [facility] 08/19/2016, 11:37 AM

## 2016-08-20 DIAGNOSIS — N179 Acute kidney failure, unspecified: Principal | ICD-10-CM

## 2016-08-20 LAB — BASIC METABOLIC PANEL
ANION GAP: 6 (ref 5–15)
BUN: 25 mg/dL — ABNORMAL HIGH (ref 6–20)
CALCIUM: 8.8 mg/dL — AB (ref 8.9–10.3)
CO2: 19 mmol/L — ABNORMAL LOW (ref 22–32)
CREATININE: 1 mg/dL (ref 0.44–1.00)
Chloride: 117 mmol/L — ABNORMAL HIGH (ref 101–111)
GFR calc non Af Amer: 60 mL/min — ABNORMAL LOW (ref >60)
Glucose, Bld: 118 mg/dL — ABNORMAL HIGH (ref 65–99)
Potassium: 3.9 mmol/L (ref 3.5–5.1)
SODIUM: 142 mmol/L (ref 135–145)

## 2016-08-20 LAB — GLUCOSE, CAPILLARY: GLUCOSE-CAPILLARY: 127 mg/dL — AB (ref 65–99)

## 2016-08-20 MED ORDER — FUROSEMIDE 20 MG PO TABS: 20.0000 mg | ORAL_TABLET | Freq: Every day | ORAL | Status: DC | PRN

## 2016-08-20 NOTE — Progress Notes (Signed)
Reviewed discharge instructions and medications with patient; all questions answered. IV removed and hemostasis achieved. Assessment is as charted. Patient leaving unit in stable condition via wheelchair.

## 2016-08-20 NOTE — Discharge Instructions (Signed)
Acute Kidney Injury, Adult °Acute kidney injury (AKI) occurs when there is sudden (acute) damage to the kidneys. A small amount of kidney damage may not cause problems, but a large amount of damage may make it difficult or impossible for the kidneys to work the way they should. AKI may develop into long-lasting (chronic) kidney disease. Early detection and treatment of AKI may prevent kidney damage from becoming permanent or getting worse. °What are the causes? °Common causes of this condition include: °· A problem with blood flow to the kidneys. This may be caused by: °¨ Blood loss. °¨ Heart and blood vessel (cardiovascular) disease. °¨ Severe burns. °¨ Liver disease. °· Direct damage to the kidneys. This may be caused by: °¨ Some medicines. °¨ A kidney infection. °¨ Poisoning. °¨ Being around or in contact with poisonous (toxic) substances. °¨ A surgical wound. °¨ A hard, direct force to the kidney area. °· A sudden blockage of urine flow. This may be caused by: °¨ Cancer. °¨ Kidney stones. °¨ Enlarged prostate in males. °What are the signs or symptoms? °Symptoms develop slowly and may not be obvious until the kidney damage becomes severe. It is possible to have AKI for years without showing any symptoms. Symptoms of this condition can include: °· Swelling (edema) of the face, legs, ankles, or feet. °· Numbness, tingling, or loss of feeling (sensation) in the hands or feet. °· Tiredness (lethargy). °· Nausea or vomiting. °· Confusion or trouble concentrating. °· Problems with urination, such as: °¨ Painful or burning feeling during urination. °¨ Decreased urine production. °¨ Frequent urination, especially at night. °¨ Bloody urine. °· Muscle twitches and cramps, especially in the legs. °· Shortness of breath. °· Weakness. °· Constant itchiness. °· Loss of appetite. °· Metallic taste in the mouth. °· Trouble sleeping. °· Pale lining of the eyelids and surface of the eye (conjunctiva). °How is this diagnosed? °This  condition may be diagnosed with various tests. Tests may include: °· Blood tests. °· Urine tests. °· Imaging tests. °· A test in which a sample of tissue is removed from the kidneys to be looked at under a microscope (kidney biopsy). °How is this treated? °Treatment of AKI varies depending on the cause and severity of the kidney damage. In mild cases, treatment may not be needed. The kidneys may heal on their own. °If AKI is more severe, your health care provider will treat the cause of the kidney damage, help the kidneys heal, and prevent problems from occurring. Severe cases may require a procedure to remove toxic wastes from the body (dialysis) or surgery to repair kidney damage. Surgery may involve: °· Repair of a torn kidney. °· Removal of a urine flow obstruction. °Follow these instructions at home: °· Follow your prescribed diet. °· Take over-the-counter and prescription medicines only as told by your health care provider. °¨ Do not take any new medicines unless approved by your health care provider. Many medicines can worsen your kidney damage. °¨ Do not take any vitamin and mineral supplements unless approved by your health care provider. Many nutritional supplements can worsen your kidney damage. °¨ The dose of some medicines that you take may need to be adjusted. °· Do not use any tobacco products, such as cigarettes, chewing tobacco, and e-cigarettes. If you need help quitting, ask your health care provider. °· Keep all follow-up visits as told by your health care provider. This is important. °· Keep track of your blood pressure. Report changes in your blood pressure as told by your   health care provider. °· Achieve and maintain a healthy weight. If you need help with this, ask your health care provider. °· Start or continue an exercise plan. Try to exercise at least 30 minutes a day, 5 days a week. °· Stay current with immunizations as told by your health care provider. °Where to find more  information: °· American Association of Kidney Patients: www.aakp.org °· National Kidney Foundation: www.kidney.org °· American Kidney Fund: www.akfinc.org °· [employer] [facility]: www.lifeoptions.org and www.kidneyschool.org °Contact a health care provider if: °· Your symptoms get worse. °· You develop new symptoms. °Get help right away if: °· You develop symptoms of end-stage kidney disease, which include: °¨ Headaches. °¨ Abnormally dark or light skin. °¨ Numbness in the hands or feet. °¨ Easy bruising. °¨ Frequent hiccups. °¨ Chest pain. °¨ Shortness of breath. °¨ End of menstruation in women. °· You have a fever. °· You have decreased urine production. °· You have pain or bleeding when you urinate. °This information is not intended to replace advice given to you by your health care provider. Make sure you discuss any questions you have with your health care provider. °Document Released: 12/26/2010 Document Revised: 01/20/2016 Document Reviewed: 02/09/2012 °Elsevier Interactive Patient Education © 2017 Elsevier Inc. ° ° °

## 2016-08-20 NOTE — Discharge Summary (Signed)
Physician Discharge Summary  Sara [name] [id number] DOB: January 01, 1956 DOA: 08/18/2016  PCP: [facility]  Admit date: 08/18/2016 Discharge date: 08/20/2016  Recommendations for Outpatient Follow-up:  1. Pt will need to follow up with PCP in 1-2 weeks post discharge 2. Please obtain BMP to evaluate electrolytes and kidney function 3. Please also check CBC to evaluate Hg and Hct levels  Discharge Diagnoses:  Principal Problem:   Acute kidney failure (HCC) Active Problems:   HTN (hypertension)   Hypokalemia   Dyslipidemia associated with type 2 diabetes mellitus ([place])  Discharge Condition: Stable  Diet recommendation: Heart healthy diet discussed in details   Brief Narrative:  61 y.o.femalewith medical history significant for DM2, HTN., recently hospitalized 2/13-2/14 for gastroenteritis following colonoscopy. While N/V resolved, diarrhea continued for 4-5 days after admission but has also eventually resolved. Since then she has been feeling weak with poor PO intake and fatigue. She went to PCP and was found to have acute kidney injury and was referred to ED for evaluation.   Creatinine was 2.8 on this admission. She was started on IV fluids with improvement in Cr.  Assessment & Plan:   Principal Problem:   Acute kidney failure ([place]) - Likely prerenal etiology in the setting of recent viral gastroenteritis and possibly lisinopril - Lisinopril held and now Cr is back to target range, WNL  - needs close follow up in an outpatient setting   Active Problems:   Hypokalemia - Due to GI losses - Supplemented and WNL this AM     Hypothyroidism - Continue synthroid    Diabetes mellitus without complications without long term insulin use - A1c 7.2 on 08/08/16 indicating good glycemic control - At home, takes Januvia     Dyslipidemia associated with type 2 DM - Continue statin therapy    Essential hypertension - Resumed home medical  regimen     Morbid obesity  - Body mass index is 42.68 kg/m.  DVT prophylaxis: Heparin subQ Code Status: full code  Family Communication: husband at bedside  Disposition Plan: home   Consultants:   None   Procedures:   None   Antimicrobials:   Procedures/Studies: Dg Abdomen Acute W/chest  Result Date: 08/08/2016 CLINICAL DATA:  Nausea, vomiting. EXAM: DG ABDOMEN ACUTE W/ 1V CHEST COMPARISON:  Radiographs of July 26, 2015. FINDINGS: There is no evidence of dilated bowel loops or free intraperitoneal air. Phleboliths are noted in the pelvis. Heart size and mediastinal contours are within normal limits. Both lungs are clear. IMPRESSION: No evidence of bowel obstruction or ileus. No acute cardiopulmonary disease. Electronically Signed   By: Marijo Conception, M.D.   On: 08/08/2016 09:09     Discharge Exam: Vitals:   08/20/16 0546 08/20/16 1014  BP: (!) 122/52 128/67  Pulse: 63 67  Resp: 18 16  Temp: 98.1 F (36.7 C) 98.4 F (36.9 C)   Vitals:   08/19/16 1717 08/19/16 2107 08/20/16 0546 08/20/16 1014  BP: (!) 112/55 121/60 (!) 122/52 128/67  Pulse: 60 67 63 67  Resp: 16 20 18 16   Temp: 97.8 F (36.6 C) 98.6 F (37 C) 98.1 F (36.7 C) 98.4 F (36.9 C)  TempSrc: Oral   Oral  SpO2: 100% 100% 98% 97%  Weight:  131.1 kg (289 lb)    Height:        General: Pt is alert, follows commands appropriately, not in acute distress Cardiovascular: Regular rate and rhythm, S1/S2 +, no murmurs, no rubs,  no gallops Respiratory: Clear to auscultation bilaterally, no wheezing, no crackles, no rhonchi Abdominal: Soft, non tender, non distended, bowel sounds +, no guarding   Discharge Instructions  Discharge Instructions    Diet - low sodium heart healthy    Complete by:  As directed    Increase activity slowly    Complete by:  As directed      Allergies as of 08/20/2016      Reactions   Dyazide [hydrochlorothiazide W-triamterene] Other (See Comments)   intolerance       Medication List    TAKE these medications   acetaminophen 500 MG chewable tablet Commonly known as:  TYLENOL Chew 500 mg by mouth every 6 (six) hours as needed for pain.   amLODipine 10 MG tablet Commonly known as:  NORVASC Take 10 mg by mouth daily.   aspirin 81 MG chewable tablet Chew 81 mg by mouth daily.   Astaxanthin 4 MG Caps Take 4 mg by mouth daily.   Biotin 1000 MCG tablet Take 1,000 mcg by mouth daily.   CITRACAL CALCIUM+D 600-40-500 MG-MG-UNIT Tb24 Generic drug:  Calcium-Magnesium-Vitamin D Take 2 tablets by mouth daily.   furosemide 20 MG tablet Commonly known as:  LASIX Take 1 tablet (20 mg total) by mouth daily as needed. What changed:  when to take this  reasons to take this   levothyroxine 175 MCG tablet Commonly known as:  SYNTHROID, LEVOTHROID Take 175 mcg by mouth daily before breakfast.   lisinopril 20 MG tablet Commonly known as:  PRINIVIL,ZESTRIL Take 20 mg by mouth daily.   pantoprazole 40 MG tablet Commonly known as:  PROTONIX Take 1 tablet (40 mg total) by mouth daily.   potassium chloride 10 MEQ tablet Commonly known as:  K-DUR,KLOR-CON Take 10 mEq by mouth daily.   simvastatin 20 MG tablet Commonly known as:  ZOCOR Take 20 mg by mouth daily.   sitaGLIPtin 100 MG tablet Commonly known as:  JANUVIA Take 100 mg by mouth daily.   sucralfate 1 GM/10ML suspension Commonly known as:  CARAFATE Take 10 mLs (1 g total) by mouth 4 (four) times daily -  with meals and at bedtime.   Vitamin D3 2000 units Tabs Take 1 tablet by mouth daily.       Follow-up Information    [facility] Follow up.   Specialty:  Family Medicine Contact information: [place] [address] [phone]        Faye Ramsay, MD Follow up.   Specialty:  Internal Medicine Why:  call my cell phone with questions please [phone] Contact information: [address] [place]  [address] [phone]        Faye Ramsay, MD .   Specialty:  Internal Medicine Contact information: [address] [place] [address] [phone]            The results of significant diagnostics from this hospitalization (including imaging, microbiology, ancillary and laboratory) are listed below for reference.     Microbiology: No results found for this or any previous visit (from the past 240 hour(s)).   Labs: Basic Metabolic Panel:  Recent Labs Lab 08/18/16 1923 08/19/16 0516 08/20/16 0649  NA 137 140 142  K 4.2 3.4* 3.9  CL 105 111 117*  CO2 19* 18* 19*  GLUCOSE 115* 105* 118*  BUN 87* 63* 25*  CREATININE 2.83* 1.86* 1.00  CALCIUM 9.4 8.8* 8.8*   Liver Function Tests:  Recent Labs Lab 08/18/16 1923  AST 19  ALT 20  ALKPHOS 93  BILITOT 0.2*  PROT 6.9  ALBUMIN 3.7    Recent Labs Lab 08/18/16 1923  LIPASE 28   No results for input(s): AMMONIA in the last 168 hours. CBC:  Recent Labs Lab 08/18/16 1923  WBC 8.1  NEUTROABS 3.5  HGB 11.2*  HCT 33.6*  MCV 76.9*  PLT 270   CBG:  Recent Labs Lab 08/19/16 0747 08/19/16 1212 08/19/16 1716 08/19/16 2233 08/20/16 0810  GLUCAP 131* 147* 118* 112* 127*   SIGNED: Time coordinating discharge: 30 minutes  MAGICK-MYERS, ISKRA, MD  [facility] 08/20/2016, 10:25 AM Pager [phone]  If 7PM-7AM, please contact night-coverage [email or web address] Password [email or web address]

## 2016-08-24 ENCOUNTER — Inpatient Hospital Stay: Payer: BC Managed Care – PPO

## 2017-01-20 ENCOUNTER — Emergency Department (HOSPITAL_COMMUNITY): Payer: BC Managed Care – PPO

## 2017-01-20 ENCOUNTER — Emergency Department (HOSPITAL_COMMUNITY)
Admission: EM | Admit: 2017-01-20 | Discharge: 2017-01-20 | Disposition: A | Discharge status: Home / Self Care | Payer: BC Managed Care – PPO | Attending: Emergency Medicine | Admitting: Emergency Medicine

## 2017-01-20 ENCOUNTER — Encounter (HOSPITAL_COMMUNITY): Payer: Self-pay

## 2017-01-20 ENCOUNTER — Other Ambulatory Visit: Payer: Self-pay

## 2017-01-20 DIAGNOSIS — Z79899 Other long term (current) drug therapy: Secondary | ICD-10-CM | POA: Insufficient documentation

## 2017-01-20 DIAGNOSIS — E039 Hypothyroidism, unspecified: Secondary | ICD-10-CM | POA: Insufficient documentation

## 2017-01-20 DIAGNOSIS — E1129 Type 2 diabetes mellitus with other diabetic kidney complication: Secondary | ICD-10-CM | POA: Insufficient documentation

## 2017-01-20 DIAGNOSIS — R112 Nausea with vomiting, unspecified: Secondary | ICD-10-CM | POA: Diagnosis present

## 2017-01-20 DIAGNOSIS — Z87891 Personal history of nicotine dependence: Secondary | ICD-10-CM | POA: Diagnosis not present

## 2017-01-20 DIAGNOSIS — Z7984 Long term (current) use of oral hypoglycemic drugs: Secondary | ICD-10-CM | POA: Insufficient documentation

## 2017-01-20 DIAGNOSIS — I10 Essential (primary) hypertension: Secondary | ICD-10-CM | POA: Diagnosis not present

## 2017-01-20 DIAGNOSIS — Z7982 Long term (current) use of aspirin: Secondary | ICD-10-CM | POA: Insufficient documentation

## 2017-01-20 LAB — URINALYSIS, ROUTINE W REFLEX MICROSCOPIC
BACTERIA UA: NONE SEEN
BILIRUBIN URINE: NEGATIVE
Glucose, UA: >=500 mg/dL — AB
Hgb urine dipstick: NEGATIVE
Ketones, ur: 80 mg/dL — AB
Leukocytes, UA: NEGATIVE
Nitrite: NEGATIVE
PROTEIN: 100 mg/dL — AB
SPECIFIC GRAVITY, URINE: 1.015 (ref 1.005–1.030)
pH: 6 (ref 5.0–8.0)

## 2017-01-20 LAB — COMPREHENSIVE METABOLIC PANEL
ALT: 20 U/L (ref 14–54)
ANION GAP: 12 (ref 5–15)
AST: 23 U/L (ref 15–41)
Albumin: 4.5 g/dL (ref 3.5–5.0)
Alkaline Phosphatase: 115 U/L (ref 38–126)
BUN: 11 mg/dL (ref 6–20)
CHLORIDE: 101 mmol/L (ref 101–111)
CO2: 22 mmol/L (ref 22–32)
CREATININE: 0.81 mg/dL (ref 0.44–1.00)
Calcium: 9.8 mg/dL (ref 8.9–10.3)
GLUCOSE: 201 mg/dL — AB (ref 65–99)
Potassium: 3.9 mmol/L (ref 3.5–5.1)
Sodium: 135 mmol/L (ref 135–145)
Total Bilirubin: 0.6 mg/dL (ref 0.3–1.2)
Total Protein: 8.5 g/dL — ABNORMAL HIGH (ref 6.5–8.1)

## 2017-01-20 LAB — CBC
HCT: 41.2 % (ref 36.0–46.0)
Hemoglobin: 13.7 g/dL (ref 12.0–15.0)
MCH: 25.7 pg — AB (ref 26.0–34.0)
MCHC: 33.3 g/dL (ref 30.0–36.0)
MCV: 77.2 fL — AB (ref 78.0–100.0)
PLATELETS: 313 10*3/uL (ref 150–400)
RBC: 5.34 MIL/uL — ABNORMAL HIGH (ref 3.87–5.11)
RDW: 17.4 % — AB (ref 11.5–15.5)
WBC: 10.9 10*3/uL — ABNORMAL HIGH (ref 4.0–10.5)

## 2017-01-20 LAB — TROPONIN I

## 2017-01-20 LAB — LIPASE, BLOOD: LIPASE: 35 U/L (ref 11–51)

## 2017-01-20 MED ORDER — LISINOPRIL 20 MG PO TABS
20.0000 mg | ORAL_TABLET | Freq: Every day | ORAL | Status: DC
  Administered 2017-01-20: 20 mg via ORAL
  Filled 2017-01-20: qty 1

## 2017-01-20 MED ORDER — AMLODIPINE BESYLATE 5 MG PO TABS
10.0000 mg | ORAL_TABLET | Freq: Every day | ORAL | Status: DC
  Administered 2017-01-20: 10 mg via ORAL
  Filled 2017-01-20: qty 2

## 2017-01-20 MED ORDER — ONDANSETRON 8 MG PO TBDP
8.0000 mg | ORAL_TABLET | Freq: Three times a day (TID) | ORAL | 0 refills | Status: DC | PRN
Start: 2017-01-20 — End: 2018-01-20

## 2017-01-20 MED ORDER — ONDANSETRON 4 MG PO TBDP
8.0000 mg | ORAL_TABLET | Freq: Once | ORAL | Status: AC
  Administered 2017-01-20: 8 mg via ORAL
  Filled 2017-01-20: qty 2

## 2017-01-20 MED ORDER — FAMOTIDINE IN NACL 20-0.9 MG/50ML-% IV SOLN
20.0000 mg | Freq: Once | INTRAVENOUS | Status: AC
  Administered 2017-01-20: 20 mg via INTRAVENOUS
  Filled 2017-01-20: qty 50

## 2017-01-20 MED ORDER — BACLOFEN 10 MG PO TABS: 10.0000 mg | ORAL_TABLET | Freq: Three times a day (TID) | ORAL | 0 refills | Status: DC | PRN

## 2017-01-20 MED ORDER — ONDANSETRON HCL 4 MG/2ML IJ SOLN
4.0000 mg | Freq: Once | INTRAMUSCULAR | Status: AC
  Administered 2017-01-20: 4 mg via INTRAVENOUS
  Filled 2017-01-20: qty 2

## 2017-01-20 MED ORDER — SODIUM CHLORIDE 0.9 % IV BOLUS (SEPSIS)
1000.0000 mL | Freq: Once | INTRAVENOUS | Status: AC
  Administered 2017-01-20: 1000 mL via INTRAVENOUS

## 2017-01-20 MED ORDER — SODIUM CHLORIDE 0.9 % IV SOLN
1000.0000 mL | INTRAVENOUS | Status: DC
  Administered 2017-01-20: 1000 mL via INTRAVENOUS

## 2017-01-20 NOTE — ED Provider Notes (Addendum)
[facility] DEPT Provider Note   CSN: [id number] Arrival date & time: 01/20/17  1811     History   Chief Complaint Chief Complaint  Patient presents with  . Emesis    HPI Sara [name] is a 61 y.o. female.  HPI Pt started having nausea and vomiting this morning.  She does not think she was able to keep down her medications.  She vomited around 10 times today.  The last episode looked bloody.  She is feeling tired and weak.  SHe denies any blood in her stool.  No diarrhea.  She has had episodes similar to this in the past.  She was in the hospital.  [facility] is not sure what caused it. Past Medical History:  Diagnosis Date  . High cholesterol   . Hypertension   . Hypothyroidism   . Nausea and vomiting since 08/07/2016  . Type II diabetes mellitus [facility])     Patient Active Problem List   Diagnosis Date Noted  . Dyslipidemia associated with type 2 diabetes mellitus ([place]) 08/19/2016  . Acute kidney failure ([place]) 08/18/2016  . Hypokalemia 08/08/2016  . Hypothyroidism 07/04/2011  . HTN (hypertension) 07/04/2011    Past Surgical History:  Procedure Laterality Date  . COLONOSCOPY  08/07/2016  . DILATION AND CURETTAGE OF UTERUS    . ESOPHAGOGASTRODUODENOSCOPY  07/06/2011   Procedure: ESOPHAGOGASTRODUODENOSCOPY (EGD);  Surgeon: Beryle Beams, MD;  Location: [facility] ENDOSCOPY;  Service: Endoscopy;  Laterality: N/A;  . OOPHORECTOMY Right 1990s   "& cyst w/it"  . TOTAL THYROIDECTOMY  2006    OB History    No data available       Home Medications    Prior to Admission medications   Medication Sig Start Date End Date Taking? Authorizing Provider  acetaminophen (TYLENOL) 500 MG chewable tablet Chew 500 mg by mouth every 6 (six) hours as needed for pain.    [provider]  amLODipine (NORVASC) 10 MG tablet Take 10 mg by mouth daily.      [provider]  aspirin 81 MG chewable tablet Chew 81 mg by mouth daily.    [provider]  Astaxanthin 4 MG  CAPS Take 4 mg by mouth daily.    [provider]  Biotin 1000 MCG tablet Take 1,000 mcg by mouth daily.    [provider]  Calcium-Magnesium-Vitamin D (CITRACAL CALCIUM+D) 600-40-500 MG-MG-UNIT TB24 Take 2 tablets by mouth daily.     [provider]  Cholecalciferol (VITAMIN D3) 2000 UNITS TABS Take 1 tablet by mouth daily.      [provider]  furosemide (LASIX) 20 MG tablet Take 1 tablet (20 mg total) by mouth daily as needed. 08/20/16   Theodis Blaze, MD  levothyroxine (SYNTHROID, LEVOTHROID) 175 MCG tablet Take 175 mcg by mouth daily before breakfast.    [provider]  lisinopril (PRINIVIL,ZESTRIL) 20 MG tablet Take 20 mg by mouth daily.    [provider]  ondansetron (ZOFRAN ODT) 8 MG disintegrating tablet Take 1 tablet (8 mg total) by mouth every 8 (eight) hours as needed for nausea or vomiting. 01/20/17   Dorie Rank, MD  pantoprazole (PROTONIX) 40 MG tablet Take 1 tablet (40 mg total) by mouth daily. 08/09/16 08/09/17  Reyne Dumas, MD  potassium chloride (K-DUR,KLOR-CON) 10 MEQ tablet Take 10 mEq by mouth daily.    [provider]  simvastatin (ZOCOR) 20 MG tablet Take 20 mg by mouth daily.    [provider]  sitaGLIPtin (  JANUVIA) 100 MG tablet Take 100 mg by mouth daily.    [provider]  sucralfate (CARAFATE) 1 GM/10ML suspension Take 10 mLs (1 g total) by mouth 4 (four) times daily -  with meals and at bedtime. 08/09/16   Reyne Dumas, MD    Family History Family History  Problem Relation Age of Onset  . Diabetes Mellitus II Mother     Social History Social History  Substance Use Topics  . Smoking status: Former Smoker    Packs/day: 0.50    Years: 12.00    Types: Cigarettes    Quit date: 16  . Smokeless tobacco: Never Used  . Alcohol use 0.6 oz/week    1 Cans of beer per week     Allergies   Dyazide [hydrochlorothiazide w-triamterene]   Review of Systems Review of Systems    Constitutional: Negative for fever.  Respiratory: Negative for chest tightness and shortness of breath.   Cardiovascular: Negative for chest pain.  All other systems reviewed and are negative.    Physical Exam Updated Vital Signs BP (!) 188/100 (BP Location: Left Arm)   Pulse 70   Temp 98.4 F (36.9 C) (Oral)   Resp 15   Ht 1.803 m (5\' 11" )   Wt 122.5 kg (270 lb)   SpO2 96%   BMI 37.66 kg/m   Physical Exam  Constitutional: She appears well-developed and well-nourished. No distress.  HENT:  Head: Normocephalic and atraumatic.  Right Ear: External ear normal.  Left Ear: External ear normal.  Eyes: Conjunctivae are normal. Right eye exhibits no discharge. Left eye exhibits no discharge. No scleral icterus.  Neck: Neck supple. No tracheal deviation present.  Cardiovascular: Normal rate, regular rhythm and intact distal pulses.   Pulmonary/Chest: Effort normal and breath sounds normal. No stridor. No respiratory distress. She has no wheezes. She has no rales.  Abdominal: Soft. Bowel sounds are normal. She exhibits no distension. There is no tenderness. There is no rebound and no guarding.  Musculoskeletal: She exhibits no edema or tenderness.  Neurological: She is alert. She has normal strength. No cranial nerve deficit (no facial droop, extraocular movements intact, no slurred speech) or sensory deficit. She exhibits normal muscle tone. She displays no seizure activity. Coordination normal.  Skin: Skin is warm and dry. No rash noted.  Psychiatric: She has a normal mood and affect.  Nursing note and vitals reviewed.    ED Treatments / Results  Labs (all labs ordered are listed, but only abnormal results are displayed) Labs Reviewed  COMPREHENSIVE METABOLIC PANEL - Abnormal; Notable for the following:       Result Value   Glucose, Bld 201 (*)    Total Protein 8.5 (*)    All other components within normal limits  CBC - Abnormal; Notable for the following:    WBC 10.9 (*)     RBC 5.34 (*)    MCV 77.2 (*)    MCH 25.7 (*)    RDW 17.4 (*)    All other components within normal limits  URINALYSIS, ROUTINE W REFLEX MICROSCOPIC - Abnormal; Notable for the following:    Glucose, UA >=500 (*)    Ketones, ur 80 (*)    Protein, ur 100 (*)    Squamous Epithelial / LPF 0-5 (*)    All other components within normal limits  LIPASE, BLOOD  TROPONIN I    EKG  EKG Interpretation None       Radiology Dg Abdomen Acute W/chest  Result  Date: 01/20/2017 CLINICAL DATA:  Nausea and vomiting EXAM: DG ABDOMEN ACUTE W/ 1V CHEST COMPARISON:  Abdominal radiograph 08/08/2016 FINDINGS: There is no evidence of dilated bowel loops or free intraperitoneal air. No radiopaque calculi or other significant radiographic abnormality is seen. Heart size and mediastinal contours are within normal limits. Both lungs are clear. IMPRESSION: Negative abdominal radiographs.  No acute cardiopulmonary disease. Electronically Signed   By: Ulyses Jarred M.D.   On: 01/20/2017 20:48    Procedures Procedures (including critical care time)  Medications Ordered in ED Medications  sodium chloride 0.9 % bolus 1,000 mL (0 mLs Intravenous Stopped 01/20/17 2201)    Followed by  0.9 %  sodium chloride infusion (0 mLs Intravenous Stopped 01/20/17 2244)  amLODipine (NORVASC) tablet 10 mg (10 mg Oral Given 01/20/17 2138)  lisinopril (PRINIVIL,ZESTRIL) tablet 20 mg (20 mg Oral Given 01/20/17 2139)  ondansetron (ZOFRAN) injection 4 mg (4 mg Intravenous Given 01/20/17 2110)  famotidine (PEPCID) IVPB 20 mg premix (0 mg Intravenous Stopped 01/20/17 2201)     Initial Impression / Assessment and Plan / ED Course  I have reviewed the triage vital signs and the nursing notes.  Pertinent labs & imaging results that were available during my care of the patient were reviewed by me and considered in my medical decision making (see chart for details).  Clinical Course as of Jan 20 2246  Sat Jan 20, 2017  2127 Pt is  feeling better.  No more vomiting.  Labs and xrays reassuring.  Pt would like to try and drink something.  I will also order her home BP meds since she did not keep them down this am.  [JK]  2240 BP is improving after taking her BP meds.  Pt tolerated oral fluids.  Feels ready to go home  [JK]    Clinical Course User Index [JK] Dorie Rank, MD    Patient presented to the emergency room with complaints of nausea and vomiting. Labs and x-rays are reassuring. Patient was treated with IV fluids and antibiotics. Her symptoms resolved and she was able tolerate oral fluids and hold down her medications.  I suspect the patient's symptoms may be related to gastroparesis versus a viral illness. She did have some mild blood-streaked emesis but I think this most likely is related to forceful episodes of vomiting. She has not had any further episodes in the ED and her hemoglobin is stable. We discussed outpatient follow-up with her primary care doctor.  Patient's blood pressure was elevated but she had not been able take her medications this morning. She was given a dose of her oral medications and her blood pressure is improving. Final Clinical Impressions(s) / ED Diagnoses   Final diagnoses:  Intractable vomiting with nausea, unspecified vomiting type  Hypertension, unspecified type    New Prescriptions New Prescriptions   ONDANSETRON (ZOFRAN ODT) 8 MG DISINTEGRATING TABLET    Take 1 tablet (8 mg total) by mouth every 8 (eight) hours as needed for nausea or vomiting.     Dorie Rank, MD 01/20/17 2248 Pt requested baclofen for hiccup   Dorie Rank, MD 01/20/17 2309

## 2017-01-20 NOTE — ED Notes (Signed)
EMT currently drawing labs 

## 2017-01-20 NOTE — Discharge Instructions (Signed)
Take medications as needed for nausea. Follow up with your  primary care doctor regarding her blood pressure. Return to the emergency room for worsening symptoms

## 2017-01-20 NOTE — ED Notes (Signed)
Attempted PIV. Access not yet established. 2nd RN to attempt.

## 2017-01-20 NOTE — ED Triage Notes (Signed)
Per Pt, pt is coming from home with complaints of diaphoresis and vomiting since this morning. Denies CP, SOB, or abdominal pain. Pt denies any other symptoms.

## 2017-01-20 NOTE — ED Notes (Signed)
Patient transported to X-ray 

## 2018-01-12 ENCOUNTER — Emergency Department (HOSPITAL_COMMUNITY)
Admission: EM | Admit: 2018-01-12 | Discharge: 2018-01-12 | Disposition: A | Discharge status: Home / Self Care | Payer: BC Managed Care – PPO | Attending: Emergency Medicine | Admitting: Emergency Medicine

## 2018-01-12 ENCOUNTER — Other Ambulatory Visit: Payer: Self-pay

## 2018-01-12 ENCOUNTER — Encounter (HOSPITAL_COMMUNITY): Payer: Self-pay | Admitting: Emergency Medicine

## 2018-01-12 ENCOUNTER — Emergency Department (HOSPITAL_COMMUNITY): Payer: BC Managed Care – PPO

## 2018-01-12 DIAGNOSIS — Z79899 Other long term (current) drug therapy: Secondary | ICD-10-CM | POA: Diagnosis not present

## 2018-01-12 DIAGNOSIS — E039 Hypothyroidism, unspecified: Secondary | ICD-10-CM | POA: Diagnosis not present

## 2018-01-12 DIAGNOSIS — E860 Dehydration: Secondary | ICD-10-CM | POA: Diagnosis not present

## 2018-01-12 DIAGNOSIS — Z87891 Personal history of nicotine dependence: Secondary | ICD-10-CM | POA: Insufficient documentation

## 2018-01-12 DIAGNOSIS — R197 Diarrhea, unspecified: Secondary | ICD-10-CM | POA: Diagnosis present

## 2018-01-12 DIAGNOSIS — I10 Essential (primary) hypertension: Secondary | ICD-10-CM | POA: Diagnosis not present

## 2018-01-12 DIAGNOSIS — Z7982 Long term (current) use of aspirin: Secondary | ICD-10-CM | POA: Insufficient documentation

## 2018-01-12 DIAGNOSIS — E119 Type 2 diabetes mellitus without complications: Secondary | ICD-10-CM | POA: Diagnosis not present

## 2018-01-12 DIAGNOSIS — Z7984 Long term (current) use of oral hypoglycemic drugs: Secondary | ICD-10-CM | POA: Insufficient documentation

## 2018-01-12 DIAGNOSIS — R112 Nausea with vomiting, unspecified: Secondary | ICD-10-CM | POA: Insufficient documentation

## 2018-01-12 LAB — CBC
HCT: 44.2 % (ref 36.0–46.0)
Hemoglobin: 14.1 g/dL (ref 12.0–15.0)
MCH: 26.9 pg (ref 26.0–34.0)
MCHC: 31.9 g/dL (ref 30.0–36.0)
MCV: 84.4 fL (ref 78.0–100.0)
PLATELETS: 285 10*3/uL (ref 150–400)
RBC: 5.24 MIL/uL — AB (ref 3.87–5.11)
RDW: 15.7 % — AB (ref 11.5–15.5)
WBC: 8.2 10*3/uL (ref 4.0–10.5)

## 2018-01-12 LAB — COMPREHENSIVE METABOLIC PANEL
ALK PHOS: 119 U/L (ref 38–126)
ALT: 17 U/L (ref 0–44)
AST: 16 U/L (ref 15–41)
Albumin: 4.3 g/dL (ref 3.5–5.0)
Anion gap: 13 (ref 5–15)
BILIRUBIN TOTAL: 0.3 mg/dL (ref 0.3–1.2)
BUN: 6 mg/dL — AB (ref 8–23)
CALCIUM: 9.4 mg/dL (ref 8.9–10.3)
CO2: 24 mmol/L (ref 22–32)
CREATININE: 0.8 mg/dL (ref 0.44–1.00)
Chloride: 100 mmol/L (ref 98–111)
Glucose, Bld: 234 mg/dL — ABNORMAL HIGH (ref 70–99)
Potassium: 3.8 mmol/L (ref 3.5–5.1)
Sodium: 137 mmol/L (ref 135–145)
Total Protein: 8.5 g/dL — ABNORMAL HIGH (ref 6.5–8.1)

## 2018-01-12 LAB — URINALYSIS, ROUTINE W REFLEX MICROSCOPIC
Bacteria, UA: NONE SEEN
Bilirubin Urine: NEGATIVE
Glucose, UA: >=500 mg/dL — AB
HGB URINE DIPSTICK: NEGATIVE
KETONES UR: NEGATIVE mg/dL
LEUKOCYTES UA: NEGATIVE
Nitrite: NEGATIVE
PH: 8 (ref 5.0–8.0)
Protein, ur: NEGATIVE mg/dL
Specific Gravity, Urine: 1.009 (ref 1.005–1.030)

## 2018-01-12 LAB — LIPASE, BLOOD: Lipase: 30 U/L (ref 11–51)

## 2018-01-12 MED ORDER — ONDANSETRON 4 MG PO TBDP
4.0000 mg | ORAL_TABLET | Freq: Once | ORAL | Status: AC | PRN
  Administered 2018-01-12: 4 mg via ORAL
  Filled 2018-01-12: qty 1

## 2018-01-12 MED ORDER — PROMETHAZINE HCL 25 MG PO TABS: 25.0000 mg | ORAL_TABLET | Freq: Four times a day (QID) | ORAL | 0 refills | Status: DC | PRN

## 2018-01-12 MED ORDER — PROMETHAZINE HCL 25 MG/ML IJ SOLN
12.5000 mg | Freq: Once | INTRAMUSCULAR | Status: AC
  Administered 2018-01-12: 12.5 mg via INTRAVENOUS
  Filled 2018-01-12: qty 1

## 2018-01-12 MED ORDER — FAMOTIDINE 20 MG PO TABS: 20.0000 mg | ORAL_TABLET | Freq: Two times a day (BID) | ORAL | 0 refills | Status: DC

## 2018-01-12 MED ORDER — SODIUM CHLORIDE 0.9 % IV SOLN
1000.0000 mL | INTRAVENOUS | Status: DC
  Administered 2018-01-12: 1000 mL via INTRAVENOUS

## 2018-01-12 MED ORDER — FAMOTIDINE IN NACL 20-0.9 MG/50ML-% IV SOLN
20.0000 mg | Freq: Once | INTRAVENOUS | Status: AC
  Administered 2018-01-12: 20 mg via INTRAVENOUS
  Filled 2018-01-12: qty 50

## 2018-01-12 MED ORDER — ONDANSETRON HCL 4 MG/2ML IJ SOLN
4.0000 mg | Freq: Once | INTRAMUSCULAR | Status: AC
  Administered 2018-01-12: 4 mg via INTRAVENOUS
  Filled 2018-01-12: qty 2

## 2018-01-12 MED ORDER — SODIUM CHLORIDE 0.9 % IV BOLUS (SEPSIS)
1000.0000 mL | Freq: Once | INTRAVENOUS | Status: AC
  Administered 2018-01-12: 1000 mL via INTRAVENOUS

## 2018-01-12 MED ORDER — ONDANSETRON 4 MG PO TBDP: 4.0000 mg | ORAL_TABLET | ORAL | 0 refills | Status: DC | PRN

## 2018-01-12 MED ORDER — PROMETHAZINE HCL 25 MG RE SUPP: 25.0000 mg | Freq: Four times a day (QID) | RECTAL | 0 refills | Status: DC | PRN

## 2018-01-12 MED ORDER — LISINOPRIL 20 MG PO TABS
20.0000 mg | ORAL_TABLET | Freq: Once | ORAL | Status: AC
  Administered 2018-01-12: 20 mg via ORAL
  Filled 2018-01-12: qty 1

## 2018-01-12 NOTE — ED Provider Notes (Addendum)
[facility] EMERGENCY DEPARTMENT Provider Note   CSN: [id number] Arrival date & time: 01/12/18  1551     History   Chief Complaint Chief Complaint  Patient presents with  . Chills  . Emesis  . Diarrhea    HPI Sara [name] is a 62 y.o. female.  HPI Was feeling well yesterday, she had dinner at [employer].  This morning after she awakened she had diarrhea.  She then went on to have several more episodes of diarrhea and started vomiting.  She reports since then she has not been able to take her medications or keep any food down.  She denies any abdominal pain.  No fever.  Slight chills.  No cough, no shortness of breath, no chest pain. Past Medical History:  Diagnosis Date  . High cholesterol   . Hypertension   . Hypothyroidism   . Nausea and vomiting since 08/07/2016  . Type II diabetes mellitus [facility])     Patient Active Problem List   Diagnosis Date Noted  . Dyslipidemia associated with type 2 diabetes mellitus ([place]) 08/19/2016  . Acute kidney failure ([place]) 08/18/2016  . Hypokalemia 08/08/2016  . Hypothyroidism 07/04/2011  . HTN (hypertension) 07/04/2011    Past Surgical History:  Procedure Laterality Date  . COLONOSCOPY  08/07/2016  . DILATION AND CURETTAGE OF UTERUS    . ESOPHAGOGASTRODUODENOSCOPY  07/06/2011   Procedure: ESOPHAGOGASTRODUODENOSCOPY (EGD);  Surgeon: Beryle Beams, MD;  Location: [facility] ENDOSCOPY;  Service: Endoscopy;  Laterality: N/A;  . OOPHORECTOMY Right 1990s   "& cyst w/it"  . TOTAL THYROIDECTOMY  2006     OB History   None      Home Medications    Prior to Admission medications   Medication Sig Start Date End Date Taking? Authorizing Provider  acetaminophen (TYLENOL) 500 MG chewable tablet Chew 500 mg by mouth every 6 (six) hours as needed for pain.    [provider]  amLODipine (NORVASC) 10 MG tablet Take 10 mg by mouth daily.      [provider]  aspirin 81 MG chewable tablet Chew 81 mg by  mouth daily.    [provider]  Astaxanthin 4 MG CAPS Take 4 mg by mouth daily.    [provider]  baclofen (LIORESAL) 10 MG tablet Take 1 tablet (10 mg total) by mouth 3 (three) times daily as needed (hiccup). 01/20/17   Dorie Rank, MD  Biotin 1000 MCG tablet Take 1,000 mcg by mouth daily.    [provider]  Calcium-Magnesium-Vitamin D (CITRACAL CALCIUM+D) 600-40-500 MG-MG-UNIT TB24 Take 2 tablets by mouth daily.     [provider]  Cholecalciferol (VITAMIN D3) 2000 UNITS TABS Take 1 tablet by mouth daily.      [provider]  famotidine (PEPCID) 20 MG tablet Take 1 tablet (20 mg total) by mouth 2 (two) times daily. 01/12/18   Charlesetta Shanks, MD  furosemide (LASIX) 20 MG tablet Take 1 tablet (20 mg total) by mouth daily as needed. 08/20/16   Theodis Blaze, MD  levothyroxine (SYNTHROID, LEVOTHROID) 175 MCG tablet Take 175 mcg by mouth daily before breakfast.    [provider]  lisinopril (PRINIVIL,ZESTRIL) 20 MG tablet Take 20 mg by mouth daily.    [provider]  ondansetron (ZOFRAN ODT) 4 MG disintegrating tablet Take 1 tablet (4 mg total) by mouth every 4 (four) hours as needed for nausea or vomiting. 01/12/18   Charlesetta Shanks, MD  ondansetron [facility]  ODT) 8 MG disintegrating tablet Take 1 tablet (8 mg total) by mouth every 8 (eight) hours as needed for nausea or vomiting. 01/20/17   Dorie Rank, MD  pantoprazole (PROTONIX) 40 MG tablet Take 1 tablet (40 mg total) by mouth daily. 08/09/16 08/09/17  Reyne Dumas, MD  potassium chloride (K-DUR,KLOR-CON) 10 MEQ tablet Take 10 mEq by mouth daily.    [provider]  promethazine (PHENERGAN) 25 MG suppository Place 1 suppository (25 mg total) rectally every 6 (six) hours as needed for nausea or vomiting. 01/12/18   Charlesetta Shanks, MD  promethazine (PHENERGAN) 25 MG tablet Take 1 tablet (25 mg total) by mouth every 6 (six) hours as needed for nausea or vomiting. 01/12/18    Charlesetta Shanks, MD  simvastatin (ZOCOR) 20 MG tablet Take 20 mg by mouth daily.    [provider]  sitaGLIPtin (JANUVIA) 100 MG tablet Take 100 mg by mouth daily.    [provider]  sucralfate (CARAFATE) 1 GM/10ML suspension Take 10 mLs (1 g total) by mouth 4 (four) times daily -  with meals and at bedtime. 08/09/16   Reyne Dumas, MD    Family History Family History  Problem Relation Age of Onset  . Diabetes Mellitus II Mother     Social History Social History   Tobacco Use  . Smoking status: Former Smoker    Packs/day: 0.50    Years: 12.00    Pack years: 6.00    Types: Cigarettes    Last attempt to quit: 1991    Years since quitting: 28.5  . Smokeless tobacco: Never Used  Substance Use Topics  . Alcohol use: Yes    Alcohol/week: 0.6 oz    Types: 1 Cans of beer per week  . Drug use: Yes    Types: Marijuana     Allergies   Dyazide [hydrochlorothiazide w-triamterene]   Review of Systems Review of Systems 10 Systems reviewed and are negative for acute change except as noted in the HPI.   Physical Exam Updated Vital Signs BP (!) 160/86   Pulse 85   Temp 98 F (36.7 C) (Oral)   Resp 16   Ht 5\' 11"  (1.803 m)   Wt 127 kg (280 lb)   SpO2 97%   BMI 39.05 kg/m   Physical Exam General: Is alert and appropriate.  Nontoxic.  No respiratory distress HEENT: Fact atraumatic.  Posterior oropharynx widely patent.  Mucous membranes pink and moist. Cardiovascular: Heart regular, no gross rub murmur gallop Lungs: Clear no wheeze. Abdomen positive bowel sounds soft and nontender Extremities: No peripheral edema calves soft and nontender.  Distal pulses 2+. Neurologic: Alert and appropriate, cranial nerves II through XII intact, all movements coordinated purposeful symmetric. Skin: Warm and dry.  ED Treatments / Results  Labs (all labs ordered are listed, but only abnormal results are displayed) Labs Reviewed  COMPREHENSIVE METABOLIC PANEL -  Abnormal; Notable for the following components:      Result Value   Glucose, Bld 234 (*)    BUN 6 (*)    Total Protein 8.5 (*)    All other components within normal limits  CBC - Abnormal; Notable for the following components:   RBC 5.24 (*)    RDW 15.7 (*)    All other components within normal limits  URINALYSIS, ROUTINE W REFLEX MICROSCOPIC - Abnormal; Notable for the following components:   Color, Urine STRAW (*)    Glucose, UA >=500 (*)    All other components within  normal limits  LIPASE, BLOOD    EKG None  Radiology Dg Abd Acute W/chest  Result Date: 01/12/2018 CLINICAL DATA:  Chills with emesis EXAM: DG ABDOMEN ACUTE W/ 1V CHEST COMPARISON:  Abdomen series January 20, 2017 FINDINGS: PA chest: There is no edema or consolidation. Heart size and pulmonary vascularity are normal. No adenopathy. There is aortic atherosclerosis. Supine and upright abdomen: There is a generalized paucity of gas in the abdomen pelvis. There is no bowel dilatation or air-fluid level to suggest bowel obstruction. No free air. There are vascular calcifications in the pelvis. IMPRESSION: Paucity of bowel gas in the abdomen pelvis. While this finding may be seen normally, it does raise concern for early ileus or enteritis. Bowel obstruction is possible but less likely. No free air. No lung edema or consolidation. There is aortic atherosclerosis. Aortic Atherosclerosis (ICD10-I70.0). Electronically Signed   By: Lowella Grip III M.D.   On: 01/12/2018 20:41    Procedures Procedures (including critical care time)  Medications Ordered in ED Medications  sodium chloride 0.9 % bolus 1,000 mL (0 mLs Intravenous Stopped 01/12/18 2056)    Followed by  0.9 %  sodium chloride infusion (0 mLs Intravenous Stopped 01/12/18 2116)  ondansetron (ZOFRAN-ODT) disintegrating tablet 4 mg (4 mg Oral Given 01/12/18 1621)  ondansetron (ZOFRAN) injection 4 mg (4 mg Intravenous Given 01/12/18 1822)  famotidine (PEPCID) IVPB 20 mg  premix (0 mg Intravenous Stopped 01/12/18 1915)  promethazine (PHENERGAN) injection 12.5 mg (12.5 mg Intravenous Given 01/12/18 1936)     Initial Impression / Assessment and Plan / ED Course  I have reviewed the triage vital signs and the nursing notes.  Pertinent labs & imaging results that were available during my care of the patient were reviewed by me and considered in my medical decision making (see chart for details).    Check: She reports she feels much improved.  Ports she does still feel fatigued but nausea is much better.  Abdominal pain.  He would like trying to go home with antiemetics. X-ray does not have air-fluid levels or evident obstruction. Radiology report suggests possible enteritis which is consistent with symptoms.  Patient does not hane fever, is not experiencing abdominal pain, has no reproducible abdominal pain to palpation, do not suspect bacterial or surgical etiology.  Final Clinical Impressions(s) / ED Diagnoses   Final diagnoses:  Nausea vomiting and diarrhea  Dehydration   Patient presents with acute onset of vomiting and diarrheal illness.  She had eaten at a [employer] restaurant yesterday evening.  This may be food related gastroenteritis.  She does not have abdominal pain or fever.  This time we will continue home treatment with Zofran and Phenergan.  Return precautions reviewed. ED Discharge Orders        Ordered    ondansetron (ZOFRAN ODT) 4 MG disintegrating tablet  Every 4 hours PRN     01/12/18 2107    promethazine (PHENERGAN) 25 MG suppository  Every 6 hours PRN     01/12/18 2107    promethazine (PHENERGAN) 25 MG tablet  Every 6 hours PRN     01/12/18 2107    famotidine (PEPCID) 20 MG tablet  2 times daily     01/12/18 2107       Charlesetta Shanks, MD 01/12/18 2119    Charlesetta Shanks, MD 01/13/18 2156

## 2018-01-12 NOTE — ED Notes (Signed)
Patient able to ambulate independently  

## 2018-01-12 NOTE — ED Notes (Signed)
Pt transported to xray 

## 2018-01-12 NOTE — ED Notes (Signed)
Patient's pump continues to beep due to patient bending arm.  Boluses still running

## 2018-01-12 NOTE — ED Notes (Signed)
Monitored patient after PO meds.  Able to hold down several sips of water without emesis.  Spoke to EDP who agreed with patient going home.

## 2018-01-12 NOTE — ED Notes (Signed)
Pt informed this RN that she vomited again after moving around.  This RN informed MD and also of patient's BP of 180/110.  Will attempt PO lisinopril and monitor patient.

## 2018-01-12 NOTE — ED Triage Notes (Signed)
Pt presents with chills and emesis since this morning at 8am; denies abd pain, cp, sob; pt states the last time she was like this she was dehydrated; pt also states she has been unable to keep anything down all day

## 2018-01-18 ENCOUNTER — Encounter (HOSPITAL_COMMUNITY): Payer: Self-pay | Admitting: Emergency Medicine

## 2018-01-18 ENCOUNTER — Inpatient Hospital Stay (HOSPITAL_COMMUNITY)
Admission: EM | Admit: 2018-01-18 | Discharge: 2018-01-20 | DRG: 641 | Disposition: A | Discharge status: Home / Self Care | Payer: BC Managed Care – PPO | Attending: Internal Medicine | Admitting: Internal Medicine

## 2018-01-18 ENCOUNTER — Other Ambulatory Visit: Payer: Self-pay

## 2018-01-18 DIAGNOSIS — I959 Hypotension, unspecified: Secondary | ICD-10-CM | POA: Diagnosis present

## 2018-01-18 DIAGNOSIS — E039 Hypothyroidism, unspecified: Secondary | ICD-10-CM | POA: Diagnosis present

## 2018-01-18 DIAGNOSIS — Z833 Family history of diabetes mellitus: Secondary | ICD-10-CM

## 2018-01-18 DIAGNOSIS — I10 Essential (primary) hypertension: Secondary | ICD-10-CM | POA: Diagnosis present

## 2018-01-18 DIAGNOSIS — Z79890 Hormone replacement therapy (postmenopausal): Secondary | ICD-10-CM

## 2018-01-18 DIAGNOSIS — E871 Hypo-osmolality and hyponatremia: Secondary | ICD-10-CM | POA: Diagnosis not present

## 2018-01-18 DIAGNOSIS — E1169 Type 2 diabetes mellitus with other specified complication: Secondary | ICD-10-CM | POA: Diagnosis present

## 2018-01-18 DIAGNOSIS — R571 Hypovolemic shock: Secondary | ICD-10-CM

## 2018-01-18 DIAGNOSIS — E861 Hypovolemia: Secondary | ICD-10-CM | POA: Diagnosis not present

## 2018-01-18 DIAGNOSIS — N179 Acute kidney failure, unspecified: Secondary | ICD-10-CM | POA: Diagnosis not present

## 2018-01-18 DIAGNOSIS — Z87891 Personal history of nicotine dependence: Secondary | ICD-10-CM | POA: Diagnosis not present

## 2018-01-18 DIAGNOSIS — E669 Obesity, unspecified: Secondary | ICD-10-CM | POA: Diagnosis present

## 2018-01-18 DIAGNOSIS — R197 Diarrhea, unspecified: Secondary | ICD-10-CM | POA: Diagnosis present

## 2018-01-18 DIAGNOSIS — Z6838 Body mass index (BMI) 38.0-38.9, adult: Secondary | ICD-10-CM

## 2018-01-18 DIAGNOSIS — E860 Dehydration: Principal | ICD-10-CM | POA: Diagnosis present

## 2018-01-18 DIAGNOSIS — Z7982 Long term (current) use of aspirin: Secondary | ICD-10-CM

## 2018-01-18 DIAGNOSIS — R112 Nausea with vomiting, unspecified: Secondary | ICD-10-CM | POA: Diagnosis present

## 2018-01-18 DIAGNOSIS — E876 Hypokalemia: Secondary | ICD-10-CM | POA: Diagnosis not present

## 2018-01-18 DIAGNOSIS — Z79899 Other long term (current) drug therapy: Secondary | ICD-10-CM

## 2018-01-18 DIAGNOSIS — E785 Hyperlipidemia, unspecified: Secondary | ICD-10-CM | POA: Diagnosis not present

## 2018-01-18 DIAGNOSIS — Z888 Allergy status to other drugs, medicaments and biological substances status: Secondary | ICD-10-CM

## 2018-01-18 DIAGNOSIS — E7800 Pure hypercholesterolemia, unspecified: Secondary | ICD-10-CM | POA: Diagnosis present

## 2018-01-18 DIAGNOSIS — K529 Noninfective gastroenteritis and colitis, unspecified: Secondary | ICD-10-CM | POA: Diagnosis present

## 2018-01-18 DIAGNOSIS — Z7984 Long term (current) use of oral hypoglycemic drugs: Secondary | ICD-10-CM | POA: Diagnosis not present

## 2018-01-18 DIAGNOSIS — T383X5A Adverse effect of insulin and oral hypoglycemic [antidiabetic] drugs, initial encounter: Secondary | ICD-10-CM | POA: Diagnosis present

## 2018-01-18 DIAGNOSIS — I9589 Other hypotension: Secondary | ICD-10-CM | POA: Diagnosis not present

## 2018-01-18 LAB — CBC WITH DIFFERENTIAL/PLATELET
ABS IMMATURE GRANULOCYTES: 0.1 10*3/uL (ref 0.0–0.1)
Basophils Absolute: 0 10*3/uL (ref 0.0–0.1)
Basophils Relative: 0 %
Eosinophils Absolute: 0.1 10*3/uL (ref 0.0–0.7)
Eosinophils Relative: 1 %
HEMATOCRIT: 42.6 % (ref 36.0–46.0)
HEMOGLOBIN: 13.9 g/dL (ref 12.0–15.0)
IMMATURE GRANULOCYTES: 1 %
LYMPHS ABS: 3.7 10*3/uL (ref 0.7–4.0)
LYMPHS PCT: 34 %
MCH: 26.6 pg (ref 26.0–34.0)
MCHC: 32.6 g/dL (ref 30.0–36.0)
MCV: 81.5 fL (ref 78.0–100.0)
Monocytes Absolute: 1.4 10*3/uL — ABNORMAL HIGH (ref 0.1–1.0)
Monocytes Relative: 12 %
NEUTROS PCT: 52 %
Neutro Abs: 5.7 10*3/uL (ref 1.7–7.7)
Platelets: 306 10*3/uL (ref 150–400)
RBC: 5.23 MIL/uL — AB (ref 3.87–5.11)
RDW: 14.6 % (ref 11.5–15.5)
WBC: 11 10*3/uL — AB (ref 4.0–10.5)

## 2018-01-18 LAB — COMPREHENSIVE METABOLIC PANEL
ALT: 24 U/L (ref 0–44)
AST: 17 U/L (ref 15–41)
Albumin: 3.8 g/dL (ref 3.5–5.0)
Alkaline Phosphatase: 103 U/L (ref 38–126)
Anion gap: 15 (ref 5–15)
BUN: 76 mg/dL — AB (ref 8–23)
CO2: 20 mmol/L — ABNORMAL LOW (ref 22–32)
Calcium: 9.2 mg/dL (ref 8.9–10.3)
Chloride: 95 mmol/L — ABNORMAL LOW (ref 98–111)
Creatinine, Ser: 4.73 mg/dL — ABNORMAL HIGH (ref 0.44–1.00)
GFR, EST AFRICAN AMERICAN: 11 mL/min — AB (ref >60)
GFR, EST NON AFRICAN AMERICAN: 9 mL/min — AB (ref >60)
Glucose, Bld: 136 mg/dL — ABNORMAL HIGH (ref 70–99)
POTASSIUM: 3.7 mmol/L (ref 3.5–5.1)
Sodium: 130 mmol/L — ABNORMAL LOW (ref 135–145)
Total Bilirubin: 0.7 mg/dL (ref 0.3–1.2)
Total Protein: 7.5 g/dL (ref 6.5–8.1)

## 2018-01-18 LAB — I-STAT CG4 LACTIC ACID, ED
LACTIC ACID, VENOUS: 2.11 mmol/L — AB (ref 0.5–1.9)
LACTIC ACID, VENOUS: 1.59 mmol/L (ref 0.5–1.9)

## 2018-01-18 LAB — MAGNESIUM: Magnesium: 2.4 mg/dL (ref 1.7–2.4)

## 2018-01-18 LAB — TSH: TSH: 0.806 u[IU]/mL (ref 0.350–4.500)

## 2018-01-18 LAB — GLUCOSE, CAPILLARY
Glucose-Capillary: 102 mg/dL — ABNORMAL HIGH (ref 70–99)
Glucose-Capillary: 112 mg/dL — ABNORMAL HIGH (ref 70–99)

## 2018-01-18 MED ORDER — MAGNESIUM OXIDE 400 (241.3 MG) MG PO TABS
400.0000 mg | ORAL_TABLET | Freq: Two times a day (BID) | ORAL | Status: DC
  Administered 2018-01-18 – 2018-01-20 (×4): 400 mg via ORAL
  Filled 2018-01-18 (×4): qty 1

## 2018-01-18 MED ORDER — ACETAMINOPHEN 650 MG RE SUPP: 650.0000 mg | Freq: Four times a day (QID) | RECTAL | Status: DC | PRN

## 2018-01-18 MED ORDER — ONDANSETRON HCL 4 MG PO TABS: 4.0000 mg | ORAL_TABLET | Freq: Four times a day (QID) | ORAL | Status: DC | PRN

## 2018-01-18 MED ORDER — SODIUM CHLORIDE 0.9 % IV SOLN
2.0000 g | Freq: Once | INTRAVENOUS | Status: AC
  Administered 2018-01-18: 2 g via INTRAVENOUS
  Filled 2018-01-18: qty 2

## 2018-01-18 MED ORDER — VANCOMYCIN HCL 10 G IV SOLR
2000.0000 mg | Freq: Once | INTRAVENOUS | Status: AC
  Administered 2018-01-18: 2000 mg via INTRAVENOUS
  Filled 2018-01-18: qty 2000

## 2018-01-18 MED ORDER — SODIUM CHLORIDE 0.9 % IV BOLUS (SEPSIS): 1000.0000 mL | Freq: Once | INTRAVENOUS | Status: DC

## 2018-01-18 MED ORDER — MAGNESIUM SULFATE 2 GM/50ML IV SOLN
2.0000 g | Freq: Once | INTRAVENOUS | Status: AC
  Administered 2018-01-18: 2 g via INTRAVENOUS
  Filled 2018-01-18: qty 50

## 2018-01-18 MED ORDER — ZOLPIDEM TARTRATE 5 MG PO TABS: 5.0000 mg | ORAL_TABLET | Freq: Every evening | ORAL | Status: DC | PRN

## 2018-01-18 MED ORDER — INSULIN ASPART 100 UNIT/ML ~~LOC~~ SOLN
0.0000 [IU] | Freq: Three times a day (TID) | SUBCUTANEOUS | Status: DC
  Administered 2018-01-19: 2 [IU] via SUBCUTANEOUS

## 2018-01-18 MED ORDER — SODIUM CHLORIDE 0.9 % IV BOLUS
1000.0000 mL | Freq: Once | INTRAVENOUS | Status: AC
  Administered 2018-01-18: 1000 mL via INTRAVENOUS

## 2018-01-18 MED ORDER — POTASSIUM CHLORIDE IN NACL 40-0.9 MEQ/L-% IV SOLN
INTRAVENOUS | Status: AC
  Administered 2018-01-18 – 2018-01-19 (×4): 150 mL/h via INTRAVENOUS
  Filled 2018-01-18 (×5): qty 1000

## 2018-01-18 MED ORDER — SENNA 8.6 MG PO TABS
1.0000 | ORAL_TABLET | Freq: Two times a day (BID) | ORAL | Status: DC
  Administered 2018-01-18 – 2018-01-20 (×4): 8.6 mg via ORAL
  Filled 2018-01-18 (×4): qty 1

## 2018-01-18 MED ORDER — ENOXAPARIN SODIUM 40 MG/0.4ML ~~LOC~~ SOLN
40.0000 mg | SUBCUTANEOUS | Status: DC
  Administered 2018-01-18 – 2018-01-19 (×2): 40 mg via SUBCUTANEOUS
  Filled 2018-01-18 (×2): qty 0.4

## 2018-01-18 MED ORDER — ONDANSETRON HCL 4 MG/2ML IJ SOLN: 4.0000 mg | Freq: Four times a day (QID) | INTRAMUSCULAR | Status: DC | PRN

## 2018-01-18 MED ORDER — POTASSIUM CHLORIDE 20 MEQ PO PACK: 40.0000 meq | PACK | Freq: Two times a day (BID) | ORAL | Status: DC

## 2018-01-18 MED ORDER — POTASSIUM CHLORIDE CRYS ER 20 MEQ PO TBCR
40.0000 meq | EXTENDED_RELEASE_TABLET | Freq: Two times a day (BID) | ORAL | Status: DC
  Administered 2018-01-18 – 2018-01-20 (×4): 40 meq via ORAL
  Filled 2018-01-18 (×4): qty 2

## 2018-01-18 MED ORDER — ACETAMINOPHEN 325 MG PO TABS: 650.0000 mg | ORAL_TABLET | Freq: Four times a day (QID) | ORAL | Status: DC | PRN

## 2018-01-18 MED ORDER — SODIUM CHLORIDE 0.9 % IV BOLUS (SEPSIS)
1000.0000 mL | Freq: Once | INTRAVENOUS | Status: AC
  Administered 2018-01-18: 1000 mL via INTRAVENOUS

## 2018-01-18 MED ORDER — OXYCODONE HCL 5 MG PO TABS: 5.0000 mg | ORAL_TABLET | ORAL | Status: DC | PRN

## 2018-01-18 NOTE — H&P (Signed)
History and Physical    Sara [name] KYH:[id number] DOB: July 07, 1955 DOA: 01/18/2018  PCP: Seward Carol, MD  Patient coming from: Home  I have personally briefly reviewed patient's old medical records in [place]  Chief Complaint: Severe weakness and dizziness for 2 days  HPI: Sara [name] is a 62 y.o. female with medical history significant of this type II with recent nausea and vomiting.  She saw her doctor yesterday after being started on metformin and having had severe diarrhea since it was started.  He did some blood work and found her creatinine to be elevated and told her to come into the emergency department.  In the emergency department she was found to have a creatinine of 4.73 a BUN of 76 and a blood pressure of 76/54.  Aggressive IV fluid rehydration has begun.  Patient is thought to be perhaps mildly septic but it is unclear whether it is just dehydration or mild sepsis she does have a slightly elevated white blood cell count, she is hypotensive, her creatinine is elevated.  States that she feels fairly poorly but has started to feel better since she received the IV fluid.  She has been having some diarrhea which we will send to the lab for evaluation. ED Course: She currently still receiving liters 2 and 3 of rehydration.  She is starting to feel better.  She is still not made any urine.  She will be admitted into the hospital for further evaluation and management.  Review of Systems: As per HPI otherwise all other systems reviewed and  negative.    Past Medical History:  Diagnosis Date  . High cholesterol   . Hypertension   . Hypothyroidism   . Nausea and vomiting since 08/07/2016  . Type II diabetes mellitus ([place])     Past Surgical History:  Procedure Laterality Date  . COLONOSCOPY  08/07/2016  . DILATION AND CURETTAGE OF UTERUS    . ESOPHAGOGASTRODUODENOSCOPY  07/06/2011   Procedure: ESOPHAGOGASTRODUODENOSCOPY (EGD);  Surgeon: Beryle Beams, MD;   Location: [facility] ENDOSCOPY;  Service: Endoscopy;  Laterality: N/A;  . OOPHORECTOMY Right 1990s   "& cyst w/it"  . TOTAL THYROIDECTOMY  2006    Social History   Social History Narrative  . Not on file     reports that she quit smoking about 28 years ago. Her smoking use included cigarettes. She has a 6.00 pack-year smoking history. She has never used smokeless tobacco. She reports that she drinks about 0.6 oz of alcohol per week. She reports that she has current or past drug history. Drug: Marijuana.  Allergies  Allergen Reactions  . Dyazide [Hydrochlorothiazide W-Triamterene] Other (See Comments)    intolerance    Family History  Problem Relation Age of Onset  . Diabetes Mellitus II Mother      Prior to Admission medications   Medication Sig Start Date End Date Taking? Authorizing Provider  acetaminophen (TYLENOL) 500 MG chewable tablet Chew 500 mg by mouth every 6 (six) hours as needed for pain.    [provider]  amLODipine (NORVASC) 10 MG tablet Take 10 mg by mouth daily.      [provider]  aspirin 81 MG chewable tablet Chew 81 mg by mouth daily.    [provider]  Astaxanthin 4 MG CAPS Take 4 mg by mouth daily.    [provider]  baclofen (LIORESAL) 10 MG tablet Take 1 tablet (10 mg total) by mouth 3 (three) times daily as  needed (hiccup). 01/20/17   Dorie Rank, MD  Biotin 1000 MCG tablet Take 1,000 mcg by mouth daily.    [provider]  Calcium-Magnesium-Vitamin D (CITRACAL CALCIUM+D) 600-40-500 MG-MG-UNIT TB24 Take 2 tablets by mouth daily.     [provider]  Cholecalciferol (VITAMIN D3) 2000 UNITS TABS Take 1 tablet by mouth daily.      [provider]  famotidine (PEPCID) 20 MG tablet Take 1 tablet (20 mg total) by mouth 2 (two) times daily. 01/12/18   Charlesetta Shanks, MD  furosemide (LASIX) 20 MG tablet Take 1 tablet (20 mg total) by mouth daily as needed. 08/20/16   Theodis Blaze, MD  levothyroxine  (SYNTHROID, LEVOTHROID) 175 MCG tablet Take 175 mcg by mouth daily before breakfast.    [provider]  lisinopril (PRINIVIL,ZESTRIL) 20 MG tablet Take 20 mg by mouth daily.    [provider]  ondansetron (ZOFRAN ODT) 4 MG disintegrating tablet Take 1 tablet (4 mg total) by mouth every 4 (four) hours as needed for nausea or vomiting. 01/12/18   Charlesetta Shanks, MD  ondansetron (ZOFRAN ODT) 8 MG disintegrating tablet Take 1 tablet (8 mg total) by mouth every 8 (eight) hours as needed for nausea or vomiting. 01/20/17   Dorie Rank, MD  pantoprazole (PROTONIX) 40 MG tablet Take 1 tablet (40 mg total) by mouth daily. 08/09/16 08/09/17  Reyne Dumas, MD  potassium chloride (K-DUR,KLOR-CON) 10 MEQ tablet Take 10 mEq by mouth daily.    [provider]  promethazine (PHENERGAN) 25 MG suppository Place 1 suppository (25 mg total) rectally every 6 (six) hours as needed for nausea or vomiting. 01/12/18   Charlesetta Shanks, MD  promethazine (PHENERGAN) 25 MG tablet Take 1 tablet (25 mg total) by mouth every 6 (six) hours as needed for nausea or vomiting. 01/12/18   Charlesetta Shanks, MD  simvastatin (ZOCOR) 20 MG tablet Take 20 mg by mouth daily.    [provider]  sitaGLIPtin (JANUVIA) 100 MG tablet Take 100 mg by mouth daily.    [provider]  sucralfate (CARAFATE) 1 GM/10ML suspension Take 10 mLs (1 g total) by mouth 4 (four) times daily -  with meals and at bedtime. 08/09/16   Reyne Dumas, MD    Physical Exam:  Constitutional: NAD, calm, comfortable Vitals:   01/18/18 1415 01/18/18 1430 01/18/18 1445 01/18/18 1500  BP: (!) 90/49 (!) 100/57 (!) 100/57 (!) 107/49  Pulse: (!) 56 (!) 59 (!) 54 (!) 56  Resp:  17 12 (!) 9  Temp:      TempSrc:      SpO2: 97% 98% 96% 100%  Weight:      Height:       Eyes: PERRL, lids and conjunctivae normal ENMT: Mucous membranes are moist. Posterior pharynx clear of any exudate or lesions.Normal dentition.  Neck: normal, supple,  no masses, no thyromegaly Respiratory: clear to auscultation bilaterally, no wheezing, no crackles. Normal respiratory effort. No accessory muscle use.  Cardiovascular: Regular rate and rhythm, no murmurs / rubs / gallops. No extremity edema. 2+ pedal pulses. No carotid bruits.  Abdomen: no tenderness, no masses palpated. No hepatosplenomegaly. Bowel sounds positive.  Musculoskeletal: no clubbing / cyanosis. No joint deformity upper and lower extremities. Good ROM, no contractures. Normal muscle tone.  Skin: no rashes, lesions, ulcers. No induration very dry with tenting Neurologic: CN 2-12 grossly intact. Sensation intact, DTR normal. Strength 5/5 in all 4.  Psychiatric: Normal judgment and insight. Alert and oriented x 3.  Normal mood.    Labs on Admission: I have personally reviewed following labs and imaging studies  CBC: Recent Labs  Lab 01/12/18 1623 01/18/18 1317  WBC 8.2 11.0*  NEUTROABS  --  5.7  HGB 14.1 13.9  HCT 44.2 42.6  MCV 84.4 81.5  PLT 285 761   Basic Metabolic Panel: Recent Labs  Lab 01/12/18 1623 01/18/18 1317  NA 137 130*  K 3.8 3.7  CL 100 95*  CO2 24 20*  GLUCOSE 234* 136*  BUN 6* 76*  CREATININE 0.80 4.73*  CALCIUM 9.4 9.2   GFR: Estimated Creatinine Clearance: 18.3 mL/min (A) (by C-G formula based on SCr of 4.73 mg/dL (H)). Liver Function Tests: Recent Labs  Lab 01/12/18 1623 01/18/18 1317  AST 16 17  ALT 17 24  ALKPHOS 119 103  BILITOT 0.3 0.7  PROT 8.5* 7.5  ALBUMIN 4.3 3.8   Recent Labs  Lab 01/12/18 1623  LIPASE 30   Urine analysis:    Component Value Date/Time   COLORURINE STRAW (A) 01/12/2018 1605   APPEARANCEUR CLEAR 01/12/2018 1605   LABSPEC 1.009 01/12/2018 1605   PHURINE 8.0 01/12/2018 1605   GLUCOSEU >=500 (A) 01/12/2018 1605   HGBUR NEGATIVE 01/12/2018 1605   BILIRUBINUR NEGATIVE 01/12/2018 1605   KETONESUR NEGATIVE 01/12/2018 1605   PROTEINUR NEGATIVE 01/12/2018 1605   UROBILINOGEN 0.2 07/07/2011 0808   NITRITE  NEGATIVE 01/12/2018 1605   LEUKOCYTESUR NEGATIVE 01/12/2018 1605    Radiological Exams on Admission: No results found.  EKG: Independently reviewed.  Sinus rhythm with incomplete right bundle branch block and left ventricular hypertrophy Assessment/Plan Principal Problem:   Hypovolemic shock (HCC) Active Problems:   Acute kidney failure (HCC)   Nausea and vomiting   Diarrhea   Hypokalemia   Dyslipidemia associated with type 2 diabetes mellitus (HCC)   Hyponatremia   Hypothyroidism   HTN (hypertension)   1.  Hypovolemic shock: Patient will receive 3 L of IV fluid resuscitation in the emergency department and then will start her at normal saline with 20 of K at 150 mL/h.  Her blood pressure is improving.  He is starting to feel better.  She is still not made any urine.  Believe this is secondary to profound dehydration from nausea and vomiting.  2.  Acute kidney failure: Patient had a creatinine of 0.8 last week today it is 4.7 with a BUN of 76.  T scan showing enteritis.  This is likely because of profound acute kidney failure.  Expect that she will improve with hydration as kidneys appear normal on abdominal series.  3.  Nausea and vomiting: Likely due to a gastroenteritis.  KUB and abdominal series shows early ileus versus enteritis.  GI panel.  4.  Diarrhea: Doubt C. difficile colitis we will check stool panel.  Continue IV fluid rehydration.  5.  Hypokalemia: Patient to receive IV fluids with potassium in it.  Will monitor results of repletion with a.m. potassium level.  We will also check magnesium level.  I have ordered some IV and oral magnesium as I expect that to be low as well.  6.  Dyslipidemia associate with type 2 diabetes mellitus: Continue pravastatin as at home.  7.  Hyponatremia: Directly related to profound dehydration and kidney failure.  Should improve with hydration.  8.  Hypothyroidism: Continue levothyroxine.  9.  Hypertension: Given hypovolemic shock will  hold antihypertensives for now.  Will likely restart in a.m. DVT prophylaxis: Lovenox Code Status: Full code Family Communication: Talk with  patient's husband who was present during admission process.   Disposition Plan: Home in 2 to 3 days Consults called: None Admission status: Inpatient   Lady Deutscher MD FACP [facility] Pager [phone]  If 7PM-7AM, please contact night-coverage [email or web address] Password [facility]  01/18/2018, 3:46 PM

## 2018-01-18 NOTE — ED Notes (Signed)
Attempted to call report to [place] x 1 unsuccessfully. Will call back in 5 min to give report.

## 2018-01-18 NOTE — ED Notes (Signed)
Dr. Evangeline Gula at bedside at this time.

## 2018-01-18 NOTE — ED Notes (Signed)
Attempted to call report to [place] x 2 unsuccessfully. Charge nurse refused to take report. Bedside report to be given.

## 2018-01-18 NOTE — ED Notes (Signed)
Phlebotomy at bedside for labs/blood cultures.

## 2018-01-18 NOTE — ED Provider Notes (Signed)
[facility] EMERGENCY DEPARTMENT Provider Note   CSN: [id number] Arrival date & time: 01/18/18  1258     History   Chief Complaint Chief Complaint  Patient presents with  . Diarrhea    HPI Sara [name] is a 62 y.o. female.  HPI  62 year old female, she has a known history of nausea vomiting, type 2 diabetes, hypertension and hypothyroidism.  She presents today after having 1 week of symptoms including persistent nausea vomiting and diarrhea.  She reports that she was seen in her doctor's office today, blood work was drawn and she was told to come immediately to the emergency department for evaluation.  The patient reports that her nausea and vomiting is completely resolved, she now has diarrhea which is persistent, watery, multiple episodes per day.  She has also had some decreased urination but has gone 3 times today.  She denies fevers, she endorses chills, she denies travel, sick contacts or recent antibiotics.  She reports that the stool is sometimes a black and tarry appearance but mostly just watery.  Past Medical History:  Diagnosis Date  . High cholesterol   . Hypertension   . Hypothyroidism   . Nausea and vomiting since 08/07/2016  . Type II diabetes mellitus [facility])     Patient Active Problem List   Diagnosis Date Noted  . Hypovolemic shock ([place]) 01/18/2018  . Nausea and vomiting 01/18/2018  . Diarrhea 01/18/2018  . Hyponatremia 01/18/2018  . Dyslipidemia associated with type 2 diabetes mellitus ([place]) 08/19/2016  . Acute kidney failure ([place]) 08/18/2016  . Hypokalemia 08/08/2016  . Hypothyroidism 07/04/2011  . HTN (hypertension) 07/04/2011    Past Surgical History:  Procedure Laterality Date  . COLONOSCOPY  08/07/2016  . DILATION AND CURETTAGE OF UTERUS    . ESOPHAGOGASTRODUODENOSCOPY  07/06/2011   Procedure: ESOPHAGOGASTRODUODENOSCOPY (EGD);  Surgeon: Beryle Beams, MD;  Location: [facility] ENDOSCOPY;  Service: Endoscopy;  Laterality: N/A;  .  OOPHORECTOMY Right 1990s   "& cyst w/it"  . TOTAL THYROIDECTOMY  2006     OB History   None      Home Medications    Prior to Admission medications   Medication Sig Start Date End Date Taking? Authorizing Provider  acetaminophen (TYLENOL) 500 MG chewable tablet Chew 500 mg by mouth every 6 (six) hours as needed for pain.    [provider]  amLODipine (NORVASC) 10 MG tablet Take 10 mg by mouth daily.      [provider]  aspirin 81 MG chewable tablet Chew 81 mg by mouth daily.    [provider]  Astaxanthin 4 MG CAPS Take 4 mg by mouth daily.    [provider]  baclofen (LIORESAL) 10 MG tablet Take 1 tablet (10 mg total) by mouth 3 (three) times daily as needed (hiccup). 01/20/17   Dorie Rank, MD  Biotin 1000 MCG tablet Take 1,000 mcg by mouth daily.    [provider]  Calcium-Magnesium-Vitamin D (CITRACAL CALCIUM+D) 600-40-500 MG-MG-UNIT TB24 Take 2 tablets by mouth daily.     [provider]  Cholecalciferol (VITAMIN D3) 2000 UNITS TABS Take 1 tablet by mouth daily.      [provider]  famotidine (PEPCID) 20 MG tablet Take 1 tablet (20 mg total) by mouth 2 (two) times daily. 01/12/18   Charlesetta Shanks, MD  furosemide (LASIX) 20 MG tablet Take 1 tablet (20 mg total) by mouth daily as needed. 08/20/16   Theodis Blaze, MD  levothyroxine Wilmer Floor,  LEVOTHROID) 175 MCG tablet Take 175 mcg by mouth daily before breakfast.    [provider]  lisinopril (PRINIVIL,ZESTRIL) 20 MG tablet Take 20 mg by mouth daily.    [provider]  ondansetron (ZOFRAN ODT) 4 MG disintegrating tablet Take 1 tablet (4 mg total) by mouth every 4 (four) hours as needed for nausea or vomiting. 01/12/18   Charlesetta Shanks, MD  ondansetron (ZOFRAN ODT) 8 MG disintegrating tablet Take 1 tablet (8 mg total) by mouth every 8 (eight) hours as needed for nausea or vomiting. 01/20/17   Dorie Rank, MD  pantoprazole (PROTONIX) 40 MG tablet  Take 1 tablet (40 mg total) by mouth daily. 08/09/16 08/09/17  Reyne Dumas, MD  potassium chloride (K-DUR,KLOR-CON) 10 MEQ tablet Take 10 mEq by mouth daily.    [provider]  promethazine (PHENERGAN) 25 MG suppository Place 1 suppository (25 mg total) rectally every 6 (six) hours as needed for nausea or vomiting. 01/12/18   Charlesetta Shanks, MD  promethazine (PHENERGAN) 25 MG tablet Take 1 tablet (25 mg total) by mouth every 6 (six) hours as needed for nausea or vomiting. 01/12/18   Charlesetta Shanks, MD  simvastatin (ZOCOR) 20 MG tablet Take 20 mg by mouth daily.    [provider]  sitaGLIPtin (JANUVIA) 100 MG tablet Take 100 mg by mouth daily.    [provider]  sucralfate (CARAFATE) 1 GM/10ML suspension Take 10 mLs (1 g total) by mouth 4 (four) times daily -  with meals and at bedtime. 08/09/16   Reyne Dumas, MD    Family History Family History  Problem Relation Age of Onset  . Diabetes Mellitus II Mother     Social History Social History   Tobacco Use  . Smoking status: Former Smoker    Packs/day: 0.50    Years: 12.00    Pack years: 6.00    Types: Cigarettes    Last attempt to quit: 1991    Years since quitting: 28.5  . Smokeless tobacco: Never Used  Substance Use Topics  . Alcohol use: Yes    Alcohol/week: 0.6 oz    Types: 1 Cans of beer per week  . Drug use: Yes    Types: Marijuana     Allergies   Dyazide [hydrochlorothiazide w-triamterene]   Review of Systems Review of Systems  All other systems reviewed and are negative.    Physical Exam Updated Vital Signs BP (!) 76/54 (BP Location: Right Arm)   Pulse 60   Temp (!) 97.4 F (36.3 C) (Oral)   Resp 18   Ht 5\' 11"  (1.803 m)   Wt 125.2 kg (276 lb)   SpO2 100%   BMI 38.49 kg/m   Physical Exam  Constitutional: She appears well-developed and well-nourished. She appears distressed.  HENT:  Head: Normocephalic and atraumatic.  Mouth/Throat: Oropharynx is clear and moist. No  oropharyngeal exudate.  Eyes: Pupils are equal, round, and reactive to light. Conjunctivae and EOM are normal. Right eye exhibits no discharge. Left eye exhibits no discharge. No scleral icterus.  Neck: Normal range of motion. Neck supple. No JVD present. No thyromegaly present.  Cardiovascular: Normal rate, regular rhythm, normal heart sounds and intact distal pulses. Exam reveals no gallop and no friction rub.  No murmur heard. Pulmonary/Chest: Effort normal and breath sounds normal. No respiratory distress. She has no wheezes. She has no rales.  Abdominal: Soft. Bowel sounds are normal. She exhibits no distension and no mass. There is no tenderness.  Musculoskeletal: Normal  range of motion. She exhibits no edema or tenderness.  Lymphadenopathy:    She has no cervical adenopathy.  Neurological: She is alert. Coordination normal.  Skin: Skin is warm and dry. No rash noted. No erythema.  Psychiatric: She has a normal mood and affect. Her behavior is normal.  Nursing note and vitals reviewed.    ED Treatments / Results  Labs (all labs ordered are listed, but only abnormal results are displayed) Labs Reviewed  COMPREHENSIVE METABOLIC PANEL - Abnormal; Notable for the following components:      Result Value   Sodium 130 (*)    Chloride 95 (*)    CO2 20 (*)    Glucose, Bld 136 (*)    BUN 76 (*)    Creatinine, Ser 4.73 (*)    GFR calc non Af Amer 9 (*)    GFR calc Af Amer 11 (*)    All other components within normal limits  CBC WITH DIFFERENTIAL/PLATELET - Abnormal; Notable for the following components:   WBC 11.0 (*)    RBC 5.23 (*)    Monocytes Absolute 1.4 (*)    All other components within normal limits  I-STAT CG4 LACTIC ACID, ED - Abnormal; Notable for the following components:   Lactic Acid, Venous 2.11 (*)    All other components within normal limits  GASTROINTESTINAL PANEL BY PCR, STOOL (REPLACES STOOL CULTURE)  CULTURE, BLOOD (ROUTINE X 2)  CULTURE, BLOOD (ROUTINE X 2)    C DIFFICILE QUICK SCREEN W PCR REFLEX  URINALYSIS, ROUTINE W REFLEX MICROSCOPIC  MAGNESIUM  I-STAT CG4 LACTIC ACID, ED    EKG None  Radiology No results found.  Procedures .Critical Care Performed by: Noemi Chapel, MD Authorized by: Noemi Chapel, MD   Critical care provider statement:    Critical care time (minutes):  35   Critical care time was exclusive of:  Separately billable procedures and treating other patients and teaching time   Critical care was necessary to treat or prevent imminent or life-threatening deterioration of the following conditions:  Shock and renal failure   Critical care was time spent personally by me on the following activities:  Blood draw for specimens, development of treatment plan with patient or surrogate, discussions with consultants, evaluation of patient's response to treatment, examination of patient, obtaining history from patient or surrogate, ordering and performing treatments and interventions, ordering and review of laboratory studies, ordering and review of radiographic studies, pulse oximetry, re-evaluation of patient's condition and review of old charts   (including critical care time)  Medications Ordered in ED Medications  sodium chloride 0.9 % bolus 1,000 mL (1,000 mLs Intravenous New Bag/Given 01/18/18 1507)    And  sodium chloride 0.9 % bolus 1,000 mL (1,000 mLs Intravenous New Bag/Given 01/18/18 1526)    And  sodium chloride 0.9 % bolus 1,000 mL (has no administration in time range)    And  sodium chloride 0.9 % bolus 1,000 mL (has no administration in time range)  vancomycin (VANCOCIN) 2,000 mg in sodium chloride 0.9 % 500 mL IVPB (has no administration in time range)  ceFEPIme (MAXIPIME) 2 g in sodium chloride 0.9 % 100 mL IVPB (has no administration in time range)  magnesium sulfate IVPB 2 g 50 mL (has no administration in time range)  magnesium oxide (MAG-OX) tablet 400 mg (has no administration in time range)  potassium  chloride (KLOR-CON) packet 40 mEq (has no administration in time range)  sodium chloride 0.9 % bolus 1,000 mL (0 mLs  Intravenous Stopped 01/18/18 1525)     Initial Impression / Assessment and Plan / ED Course  I have reviewed the triage vital signs and the nursing notes.  Pertinent labs & imaging results that were available during my care of the patient were reviewed by me and considered in my medical decision making (see chart for details).     The patient is hypotensive, blood pressure of the mid 70s, she will need fluid resuscitation.  Her blood counts show 11,000, hemoglobin is 13.9, lactic acid was just over 2.  I am concerned that the patient is dehydrated, she will need a urine sample, creatinine, blood cultures and a C. difficile.  The patient likely needs to be admitted to the hospital.  I agree with the patient's primary physician that this needs to be fluid resuscitation due to her severe hypotension.  Patient agreeable to the plan.  The patient initially had lab work done approximately 6 days ago and had a normal creatinine of 0.8 however today it is 4.73 with a very elevated BUN at 76 which I suspect is prerenal given the ratio of BUN to creatinine and the severe vomiting and diarrhea.  She is hyponatremic, hypochloremic, mildly hyperglycemic.  Her blood counts show a leukocytosis of 11,000 but no anemia, normal platelets.  This is also consistent with a reactive leukocytosis.  The lactic acid is over 2, the patient is hypotensive but slightly improving.  At this point the patient will need to be admitted.  Code sepsis was activated secondary to hypotension, diarrhea, whether she is septic or whether her hypotension is just related to her severe dehydration it is unclear.  Urinalysis pending, will cover with vancomycin and cefepime pending urinalysis.  Will need to admit to high level of care.  She does have endorgan dysfunction with an acute kidney injury.  Critical care services  provided for this patient presents in hypovolemic shock requiring multiple fluid boluses with signs of endorgan damage including acute kidney injury.  Care discussed with the hospitalist at approximately 3:20 PM, she agrees to admit the patient to the hospital.  Final Clinical Impressions(s) / ED Diagnoses   Final diagnoses:  Hypovolemic shock ([place])  AKI (acute kidney injury) ([place])  Severe dehydration    ED Discharge Orders    None       Noemi Chapel, MD 01/18/18 1536

## 2018-01-18 NOTE — ED Triage Notes (Signed)
Patient sent here from PCP after persistent diarrhea for one week. Patient complains of weakness, BP 76/54. Patient alert oriented, and in no apparent distress at this time.

## 2018-01-18 NOTE — ED Provider Notes (Signed)
Patient placed in Quick Look pathway, seen and evaluated   Chief Complaint: Dehydration, nausea vomiting and diarrhea  HPI: Patient presents with complaint of dehydration after having approximately 1 week of nausea, vomiting, diarrhea.  Patient saw her doctor, Dr. Delfina Redwood, today and was sent to the emergency department for further treatment.  She states that her symptoms have gradually improved.  Last episode of nonbloody diarrhea was yesterday and vomiting was 2 days ago.  She continues to feel "very weak".  Denies recent antibiotic use.  She states that she gets sick like this once a year.  ROS:  Positive ROS: (+) Nausea, vomiting, diarrhea, weakness Negative ROS: (-) Fevers, abdominal pain  Physical Exam:   Gen: No distress  Neuro: Awake and Alert  Skin: Warm    Focused Exam: Heart RRR, nml S1,S2, no m/r/g; Lungs CTAB; Abd soft, NT, no rebound or guarding; Ext 2+ pedal pulses bilaterally, no edema.  BP (!) 76/54 (BP Location: Right Arm)   Pulse 60   Temp (!) 97.4 F (36.3 C) (Oral)   Resp 18   Ht 5\' 11"  (1.803 m)   Wt 125.2 kg (276 lb)   SpO2 100%   BMI 38.49 kg/m   Plan: Blood pressure noted to be low in triage, labs including lactic acid, GI pathogen panel if patient has another episode of diarrhea.  Initiation of care has begun. The patient has been counseled on the process, plan, and necessity for staying for the completion/evaluation, and the remainder of the medical screening examination    Carlisle Cater, Hershal Coria 01/18/18 1315    Noemi Chapel, MD 01/18/18 1406

## 2018-01-18 NOTE — ED Notes (Signed)
Only 1 set of blood cultures drawn up to this time. Patient very difficult stick. Will need 2nd set of blood cultures prior to initiating antibiotic therapy. Phlebotomy notified.

## 2018-01-19 DIAGNOSIS — E1169 Type 2 diabetes mellitus with other specified complication: Secondary | ICD-10-CM

## 2018-01-19 DIAGNOSIS — E861 Hypovolemia: Secondary | ICD-10-CM

## 2018-01-19 DIAGNOSIS — E876 Hypokalemia: Secondary | ICD-10-CM

## 2018-01-19 DIAGNOSIS — I9589 Other hypotension: Secondary | ICD-10-CM

## 2018-01-19 DIAGNOSIS — R112 Nausea with vomiting, unspecified: Secondary | ICD-10-CM

## 2018-01-19 DIAGNOSIS — E039 Hypothyroidism, unspecified: Secondary | ICD-10-CM

## 2018-01-19 DIAGNOSIS — N179 Acute kidney failure, unspecified: Secondary | ICD-10-CM

## 2018-01-19 DIAGNOSIS — R197 Diarrhea, unspecified: Secondary | ICD-10-CM

## 2018-01-19 DIAGNOSIS — E785 Hyperlipidemia, unspecified: Secondary | ICD-10-CM

## 2018-01-19 DIAGNOSIS — E871 Hypo-osmolality and hyponatremia: Secondary | ICD-10-CM

## 2018-01-19 LAB — HIV ANTIBODY (ROUTINE TESTING W REFLEX): HIV Screen 4th Generation wRfx: NONREACTIVE

## 2018-01-19 LAB — CBC
HCT: 34.8 % — ABNORMAL LOW (ref 36.0–46.0)
Hemoglobin: 11.1 g/dL — ABNORMAL LOW (ref 12.0–15.0)
MCH: 26.6 pg (ref 26.0–34.0)
MCHC: 31.9 g/dL (ref 30.0–36.0)
MCV: 83.3 fL (ref 78.0–100.0)
PLATELETS: 235 10*3/uL (ref 150–400)
RBC: 4.18 MIL/uL (ref 3.87–5.11)
RDW: 15.2 % (ref 11.5–15.5)
WBC: 7.8 10*3/uL (ref 4.0–10.5)

## 2018-01-19 LAB — GLUCOSE, CAPILLARY
GLUCOSE-CAPILLARY: 109 mg/dL — AB (ref 70–99)
GLUCOSE-CAPILLARY: 100 mg/dL — AB (ref 70–99)
Glucose-Capillary: 150 mg/dL — ABNORMAL HIGH (ref 70–99)

## 2018-01-19 LAB — BASIC METABOLIC PANEL
Anion gap: 9 (ref 5–15)
BUN: 57 mg/dL — ABNORMAL HIGH (ref 8–23)
CHLORIDE: 110 mmol/L (ref 98–111)
CO2: 18 mmol/L — ABNORMAL LOW (ref 22–32)
CREATININE: 2.18 mg/dL — AB (ref 0.44–1.00)
Calcium: 8 mg/dL — ABNORMAL LOW (ref 8.9–10.3)
GFR calc non Af Amer: 23 mL/min — ABNORMAL LOW (ref >60)
GFR, EST AFRICAN AMERICAN: 27 mL/min — AB (ref >60)
Glucose, Bld: 97 mg/dL (ref 70–99)
Potassium: 4.2 mmol/L (ref 3.5–5.1)
SODIUM: 137 mmol/L (ref 135–145)

## 2018-01-19 LAB — C DIFFICILE QUICK SCREEN W PCR REFLEX
C Diff antigen: NEGATIVE
C Diff interpretation: NOT DETECTED
C Diff toxin: NEGATIVE

## 2018-01-19 LAB — URINALYSIS, ROUTINE W REFLEX MICROSCOPIC
BILIRUBIN URINE: NEGATIVE
GLUCOSE, UA: NEGATIVE mg/dL
HGB URINE DIPSTICK: NEGATIVE
KETONES UR: NEGATIVE mg/dL
Leukocytes, UA: NEGATIVE
Nitrite: NEGATIVE
PROTEIN: NEGATIVE mg/dL
Specific Gravity, Urine: 1.016 (ref 1.005–1.030)
pH: 5 (ref 5.0–8.0)

## 2018-01-19 LAB — MAGNESIUM: Magnesium: 2.4 mg/dL (ref 1.7–2.4)

## 2018-01-19 NOTE — Progress Notes (Signed)
PROGRESS NOTE    Sara [name]  LTJ:[id number] DOB: [birth year] DOA: 01/18/2018 PCP: Seward Carol, MD   Brief Narrative:  HPI On 01/18/2018 by Dr. Randa Spike Sara [name] is a 62 y.o. female with medical history significant of this type II with recent nausea and vomiting.  She saw her doctor yesterday after being started on metformin and having had severe diarrhea since it was started.  He did some blood work and found her creatinine to be elevated and told her to come into the emergency department.  In the emergency department she was found to have a creatinine of 4.73 a BUN of 76 and a blood pressure of 76/54.  Aggressive IV fluid rehydration has begun.  Patient is thought to be perhaps mildly septic but it is unclear whether it is just dehydration or mild sepsis she does have a slightly elevated white blood cell count, she is hypotensive, her creatinine is elevated.  States that she feels fairly poorly but has started to feel better since she received the IV fluid.  She has been having some diarrhea which we will send to the lab for evaluation.   Assessment & Plan   Hypotension -BP on admission was noted to be 76/54 -Responded to IV fluid hydration -Continue to monitor closely  Acute kidney injury -Creatinine 1 week ago was 0.8, on admission was 4.7 -Secondary to dehydration secondary to GI losses as well as poor oral intake versus medications including metformin and lisinopril -Creatinine today 2.18 -Continue IV fluid and monitor BMP  Nausea, vomiting, diarrhea -possibly gastroenteritis versus medication induced, patient recently started metformin 1 month ago -Patient no longer complaining of nausea and vomiting is not had a bowel movement since admission -C. difficile PCR and stool panel have been ordered -Continue supportive care  Hypokalemia -Secondary to GI losses -Resolved with replacement -Continue to monitor BMP  Dyslipidemia -Continue statin  Diabetes  mellitus, type II -Hold metformin -Continue insulin sliding scale CBG monitoring  Hyponatremia -Secondary to dehydration.  Resolved with IV fluids, currently sodium 137 -Continue to monitor BMP  Hypothyroidism -Continue Synthroid  DVT Prophylaxis  lovenox  Code Status: Full  Family Communication: None at bedside  Disposition Plan: Admitted. Suspect discharge to home in 24 hours pending further improvement in creatinine and blood pressure  Consultants None  Procedures  None  Antibiotics   Anti-infectives (From admission, onward)   Start     Dose/Rate Route Frequency Ordered Stop   01/18/18 1500  vancomycin (VANCOCIN) 2,000 mg in sodium chloride 0.9 % 500 mL IVPB     2,000 mg 250 mL/hr over 120 Minutes Intravenous  Once 01/18/18 1455 01/18/18 1923   01/18/18 1500  ceFEPIme (MAXIPIME) 2 g in sodium chloride 0.9 % 100 mL IVPB     2 g 200 mL/hr over 30 Minutes Intravenous  Once 01/18/18 1455 01/18/18 1630      Subjective:   Sara [name] seen and examined today.  Feeling better and feeling hungry.  Denies current nausea or vomiting or diarrhea.  Denies current abdominal pain, chest pain, shortness breath, dizziness or headache.  Objective:   Vitals:   01/18/18 1646 01/18/18 1651 01/18/18 2030 01/19/18 0540  BP: 106/62 108/62 (!) 104/58 (!) 98/54  Pulse: 61 61 62 60  Resp: 14 14 16 16   Temp: 97.9 F (36.6 C) 97.8 F (36.6 C) 98.7 F (37.1 C) 98.3 F (36.8 C)  TempSrc: Oral Oral Oral Oral  SpO2: 100% 99% 100% 99%  Weight:  129.4  kg (285 lb 4.4 oz)  131.6 kg (290 lb 2 oz)  Height:  5\' 11"  (1.803 m)      Intake/Output Summary (Last 24 hours) at 01/19/2018 1232 Last data filed at 01/19/2018 1046 Gross per 24 hour  Intake 4464.58 ml  Output 2300 ml  Net 2164.58 ml   Filed Weights   01/18/18 1302 01/18/18 1651 01/19/18 0540  Weight: 125.2 kg (276 lb) 129.4 kg (285 lb 4.4 oz) 131.6 kg (290 lb 2 oz)    Exam  General: Well developed, well nourished, NAD,  appears stated age  89: NCAT, mucous membranes moist.   Neck: Supple  Cardiovascular: S1 S2 auscultated, RRR, no murmur  Respiratory: Clear to auscultation bilaterally with equal chest rise  Abdomen: Soft, obese, nontender, nondistended, + bowel sounds  Extremities: warm dry without cyanosis clubbing or edema  Neuro: AAOx3, nonfocal  Psych: Normal affect and demeanor with intact judgement and insight   Data Reviewed: I have personally reviewed following labs and imaging studies  CBC: Recent Labs  Lab 01/12/18 1623 01/18/18 1317 01/19/18 0538  WBC 8.2 11.0* 7.8  NEUTROABS  --  5.7  --   HGB 14.1 13.9 11.1*  HCT 44.2 42.6 34.8*  MCV 84.4 81.5 83.3  PLT 285 306 132   Basic Metabolic Panel: Recent Labs  Lab 01/12/18 1623 01/18/18 1317 01/19/18 0538  NA 137 130* 137  K 3.8 3.7 4.2  CL 100 95* 110  CO2 24 20* 18*  GLUCOSE 234* 136* 97  BUN 6* 76* 57*  CREATININE 0.80 4.73* 2.18*  CALCIUM 9.4 9.2 8.0*  MG  --  2.4 2.4   GFR: Estimated Creatinine Clearance: 40.7 mL/min (A) (by C-G formula based on SCr of 2.18 mg/dL (H)). Liver Function Tests: Recent Labs  Lab 01/12/18 1623 01/18/18 1317  AST 16 17  ALT 17 24  ALKPHOS 119 103  BILITOT 0.3 0.7  PROT 8.5* 7.5  ALBUMIN 4.3 3.8   Recent Labs  Lab 01/12/18 1623  LIPASE 30   No results for input(s): AMMONIA in the last 168 hours. Coagulation Profile: No results for input(s): INR, PROTIME in the last 168 hours. Cardiac Enzymes: No results for input(s): CKTOTAL, CKMB, CKMBINDEX, TROPONINI in the last 168 hours. BNP (last 3 results) No results for input(s): PROBNP in the last 8760 hours. HbA1C: No results for input(s): HGBA1C in the last 72 hours. CBG: Recent Labs  Lab 01/18/18 1822 01/18/18 2127 01/19/18 0753 01/19/18 1145  GLUCAP 102* 112* 109* 150*   Lipid Profile: No results for input(s): CHOL, HDL, LDLCALC, TRIG, CHOLHDL, LDLDIRECT in the last 72 hours. Thyroid Function Tests: Recent  Labs    01/18/18 1654  TSH 0.806   Anemia Panel: No results for input(s): VITAMINB12, FOLATE, FERRITIN, TIBC, IRON, RETICCTPCT in the last 72 hours. Urine analysis:    Component Value Date/Time   COLORURINE YELLOW 01/19/2018 0146   APPEARANCEUR CLEAR 01/19/2018 0146   LABSPEC 1.016 01/19/2018 0146   PHURINE 5.0 01/19/2018 0146   GLUCOSEU NEGATIVE 01/19/2018 0146   HGBUR NEGATIVE 01/19/2018 0146   BILIRUBINUR NEGATIVE 01/19/2018 0146   KETONESUR NEGATIVE 01/19/2018 0146   PROTEINUR NEGATIVE 01/19/2018 0146   UROBILINOGEN 0.2 07/07/2011 0808   NITRITE NEGATIVE 01/19/2018 0146   LEUKOCYTESUR NEGATIVE 01/19/2018 0146   Sepsis Labs: @LABRCNTIP (procalcitonin:4,lacticidven:4)  ) Recent Results (from the past 240 hour(s))  Blood Culture (routine x 2)     Status: None (Preliminary result)   Collection Time: 01/18/18  2:07 PM  Result  Value Ref Range Status   Specimen Description BLOOD RIGHT ANTECUBITAL  Final   Special Requests   Final    BOTTLES DRAWN AEROBIC AND ANAEROBIC Blood Culture adequate volume   Culture   Final    NO GROWTH < 24 HOURS Performed at [facility], [address] [place], [place] [address]    Report Status PENDING  Incomplete  Blood Culture (routine x 2)     Status: None (Preliminary result)   Collection Time: 01/18/18  3:54 PM  Result Value Ref Range Status   Specimen Description BLOOD LEFT ANTECUBITAL  Final   Special Requests   Final    BOTTLES DRAWN AEROBIC AND ANAEROBIC Blood Culture results may not be optimal due to an inadequate volume of blood received in culture bottles   Culture   Final    NO GROWTH < 24 HOURS Performed at [facility], [place] [address] [place], [place] [address]    Report Status PENDING  Incomplete      Radiology Studies: No results found.   Scheduled Meds: . enoxaparin (LOVENOX) injection  40 mg Subcutaneous Q24H  . insulin aspart  0-15 Units Subcutaneous TID WC  . magnesium oxide  400 mg Oral BID  .  potassium chloride  40 mEq Oral BID  . senna  1 tablet Oral BID   Continuous Infusions: . 0.9 % NaCl with KCl 40 mEq / L 150 mL/hr (01/19/18 0837)  . sodium chloride     And  . sodium chloride       LOS: 1 day   Time Spent in minutes   30 minutes  Dabid Godown D.O. on 01/19/2018 at 12:32 PM  Between 7am to 7pm - Pager - [phone]  After 7pm go to [email or web address] - password [email or web address]  And look for the night coverage person covering for me after hours  [facility] Office  [phone]

## 2018-01-20 DIAGNOSIS — R571 Hypovolemic shock: Secondary | ICD-10-CM

## 2018-01-20 LAB — BASIC METABOLIC PANEL
Anion gap: 5 (ref 5–15)
BUN: 26 mg/dL — ABNORMAL HIGH (ref 8–23)
CO2: 20 mmol/L — ABNORMAL LOW (ref 22–32)
Calcium: 8.5 mg/dL — ABNORMAL LOW (ref 8.9–10.3)
Chloride: 114 mmol/L — ABNORMAL HIGH (ref 98–111)
Creatinine, Ser: 1.04 mg/dL — ABNORMAL HIGH (ref 0.44–1.00)
GFR calc Af Amer: >60 mL/min (ref >60)
GFR, EST NON AFRICAN AMERICAN: 57 mL/min — AB (ref >60)
Glucose, Bld: 108 mg/dL — ABNORMAL HIGH (ref 70–99)
Potassium: 4.6 mmol/L (ref 3.5–5.1)
SODIUM: 139 mmol/L (ref 135–145)

## 2018-01-20 LAB — GLUCOSE, CAPILLARY
Glucose-Capillary: 102 mg/dL — ABNORMAL HIGH (ref 70–99)
Glucose-Capillary: 101 mg/dL — ABNORMAL HIGH (ref 70–99)

## 2018-01-20 LAB — GASTROINTESTINAL PANEL BY PCR, STOOL (REPLACES STOOL CULTURE)
Adenovirus F40/41: NOT DETECTED
Astrovirus: NOT DETECTED
CAMPYLOBACTER SPECIES: NOT DETECTED
CRYPTOSPORIDIUM: NOT DETECTED
Cyclospora cayetanensis: NOT DETECTED
ENTEROAGGREGATIVE E COLI (EAEC): NOT DETECTED
Entamoeba histolytica: NOT DETECTED
Enteropathogenic E coli (EPEC): DETECTED — AB
Enterotoxigenic E coli (ETEC): NOT DETECTED
Giardia lamblia: NOT DETECTED
Norovirus GI/GII: NOT DETECTED
PLESIMONAS SHIGELLOIDES: NOT DETECTED
ROTAVIRUS A: NOT DETECTED
SALMONELLA SPECIES: NOT DETECTED
SAPOVIRUS (I, II, IV, AND V): NOT DETECTED
SHIGA LIKE TOXIN PRODUCING E COLI (STEC): NOT DETECTED
SHIGELLA/ENTEROINVASIVE E COLI (EIEC): NOT DETECTED
Vibrio cholerae: NOT DETECTED
Vibrio species: NOT DETECTED
Yersinia enterocolitica: NOT DETECTED

## 2018-01-20 MED ORDER — LISINOPRIL 20 MG PO TABS: 20.0000 mg | ORAL_TABLET | Freq: Every day | ORAL | Status: DC

## 2018-01-20 MED ORDER — ATENOLOL 100 MG PO TABS: 100.0000 mg | ORAL_TABLET | Freq: Every day | ORAL | 3 refills | Status: AC

## 2018-01-20 MED ORDER — AMLODIPINE BESYLATE 10 MG PO TABS: 10.0000 mg | ORAL_TABLET | Freq: Every day | ORAL | Status: AC

## 2018-01-20 NOTE — Discharge Instructions (Signed)
Acute Kidney Injury, Adult Acute kidney injury is a sudden worsening of kidney function. The kidneys are organs that have several jobs. They filter the blood to remove waste products and extra fluid. They also maintain a healthy balance of minerals and hormones in the body, which helps control blood pressure and keep bones strong. With this condition, your kidneys do not do their jobs as well as they should. This condition ranges from mild to severe. Over time it may develop into long-lasting (chronic) kidney disease. Early detection and treatment may prevent acute kidney injury from developing into a chronic condition. What are the causes? Common causes of this condition include:  A problem with blood flow to the kidneys. This may be caused by: ? Low blood pressure (hypotension) or shock. ? Blood loss. ? Heart and blood vessel (cardiovascular) disease. ? Severe burns. ? Liver disease.  Direct damage to the kidneys. This may be caused by: ? Certain medicines. ? A kidney infection. ? Poisoning. ? Being around or in contact with toxic substances. ? A surgical wound. ? A hard, direct hit to the kidney area.  A sudden blockage of urine flow. This may be caused by: ? Cancer. ? Kidney stones. ? An enlarged prostate in males.  What are the signs or symptoms? Symptoms of this condition may not be obvious until the condition becomes severe. Symptoms of this condition can include:  Tiredness (lethargy), or difficulty staying awake.  Nausea or vomiting.  Swelling (edema) of the face, legs, ankles, or feet.  Problems with urination, such as: ? Abdominal pain, or pain along the side of your stomach (flank). ? Decreased urine production. ? Decrease in the force of urine flow.  Muscle twitches and cramps, especially in the legs.  Confusion or trouble concentrating.  Loss of appetite.  Fever.  How is this diagnosed? This condition may be diagnosed with tests, including:  Blood  tests.  Urine tests.  Imaging tests.  A test in which a sample of tissue is removed from the kidneys to be examined under a microscope (kidney biopsy).  How is this treated? Treatment for this condition depends on the cause and how severe the condition is. In mild cases, treatment may not be needed. The kidneys may heal on their own. In more severe cases, treatment will involve:  Treating the cause of the kidney injury. This may involve changing any medicines you are taking or adjusting your dosage.  Fluids. You may need specialized IV fluids to balance your body's needs.  Having a catheter placed to drain urine and prevent blockages.  Preventing problems from occurring. This may mean avoiding certain medicines or procedures that can cause further injury to the kidneys.  In some cases treatment may also require:  A procedure to remove toxic wastes from the body (dialysis or continuous renal replacement therapy - CRRT).  Surgery. This may be done to repair a torn kidney, or to remove the blockage from the urinary system.  Follow these instructions at home: Medicines  Take over-the-counter and prescription medicines only as told by your health care provider.  Do not take any new medicines without your health care provider's approval. Many medicines can worsen your kidney damage.  Do not take any vitamin and mineral supplements without your health care provider's approval. Many nutritional supplements can worsen your kidney damage. Lifestyle  If your health care provider prescribed changes to your diet, follow them. You may need to decrease the amount of protein you eat.  Achieve and maintain a healthy weight. If you need help with this, ask your health care provider.  Start or continue an exercise plan. Try to exercise at least 30 minutes a day, 5 days a week.  Do not use any tobacco products, such as cigarettes, chewing tobacco, and e-cigarettes. If you need help quitting, ask  your health care provider. General instructions  Keep track of your blood pressure. Report changes in your blood pressure as told by your health care provider.  Stay up to date with immunizations. Ask your health care provider which immunizations you need.  Keep all follow-up visits as told by your health care provider. This is important. Where to find more information:  American Association of Kidney Patients: BombTimer.gl  National Kidney Foundation: www.kidney.[place]: https://mathis.com/  [facility]: ? www.lifeoptions.org ? www.kidneyschool.org Contact a health care provider if:  Your symptoms get worse.  You develop new symptoms. Get help right away if:  You develop symptoms of worsening kidney disease, which include: ? Headaches. ? Abnormally dark or light skin. ? Easy bruising. ? Frequent hiccups. ? Chest pain. ? Shortness of breath. ? End of menstruation in women. ? Seizures. ? Confusion or altered mental status. ? Abdominal or back pain. ? Itchiness.  You have a fever.  Your body is producing less urine.  You have pain or bleeding when you urinate. Summary  Acute kidney injury is a sudden worsening of kidney function.  Acute kidney injury can be caused by problems with blood flow to the kidneys, direct damage to the kidneys, and sudden blockage of urine flow.  Symptoms of this condition may not be obvious until it becomes severe. Symptoms may include edema, lethargy, confusion, nausea or vomiting, and problems passing urine.  This condition can usually be diagnosed with blood tests, urine tests, and imaging tests. Sometimes a kidney biopsy is done to diagnose this condition.  Treatment for this condition often involves treating the underlying cause. It is treated with fluids, medicines, dialysis, diet changes, or surgery. This information is not intended to replace advice given to you by your health care provider.  Make sure you discuss any questions you have with your health care provider. Document Released: 12/26/2010 Document Revised: 10/12/2016 Document Reviewed: 06/02/2016 Elsevier Interactive Patient Education  2018 [employer]. Hypotension As your heart beats, it forces blood through your body. This force is called blood pressure. If you have hypotension, you have low blood pressure. When your blood pressure is too low, you may not get enough blood to your brain. You may feel weak, feel light-headed, have a fast heartbeat, or even pass out (faint). Follow these instructions at home: Eating and drinking  Drink enough fluids to keep your pee (urine) clear or pale yellow.  Eat a healthy diet, and follow instructions from your doctor about eating or drinking restrictions. A healthy diet includes: ? Fresh fruits and vegetables. ? Whole grains. ? Low-fat (lean) meats. ? Low-fat dairy products.  Eat extra salt only as told. Do not add extra salt to your diet unless your doctor tells you to.  Eat small meals often.  Avoid standing up quickly after you eat. Medicines  Take over-the-counter and prescription medicines only as told by your doctor. ? Follow instructions from your doctor about changing how much you take (the dosage) of your medicines, if this applies. ? Do not stop or change your medicine on your own. General instructions  Wear compression stockings as told by your  doctor.  Get up slowly from lying down or sitting.  Avoid hot showers and a lot of heat as told by your doctor.  Return to your normal activities as told by your doctor. Ask what activities are safe for you.  Do not use any products that contain nicotine or tobacco, such as cigarettes and e-cigarettes. If you need help quitting, ask your doctor.  Keep all follow-up visits as told by your doctor. This is important. Contact a doctor if:  You throw up (vomit).  You have watery poop (diarrhea).  You have a fever  for more than 2-3 days.  You feel more thirsty than normal.  You feel weak and tired. Get help right away if:  You have chest pain.  You have a fast or irregular heartbeat.  You lose feeling (get numbness) in any part of your body.  You cannot move your arms or your legs.  You have trouble talking.  You get sweaty or feel light-headed.  You faint.  You have trouble breathing.  You have trouble staying awake.  You feel confused. This information is not intended to replace advice given to you by your health care provider. Make sure you discuss any questions you have with your health care provider. Document Released: 09/06/2009 Document Revised: 02/29/2016 Document Reviewed: 02/29/2016 Elsevier Interactive Patient Education  2017 [employer].

## 2018-01-20 NOTE — Discharge Summary (Signed)
Physician Discharge Summary  NOHELIA VALENZA NWG:[id number] DOB: 25-Jul-1955 DOA: 01/18/2018  PCP: Seward Carol, MD  Admit date: 01/18/2018 Discharge date: 01/20/2018  Time spent: 45 minutes  Recommendations for Outpatient Follow-up:  Patient will be discharged to home.  Patient will need to follow up with primary care provider within one week of discharge, discuss blood pressure and diabetes control and repeat BMP.  Patient should continue medications as prescribed.  Patient should follow a heart healthy/carb modified diet.   Discharge Diagnoses:  Hypotension Acute kidney injury Nausea, vomiting, diarrhea Hypokalemia Dyslipidemia Diabetes mellitus, type II Hyponatremia Hypothyroidism  Discharge Condition: stable  Diet recommendation: heart healthy/carb modified   Filed Weights   01/18/18 1302 01/18/18 1651 01/19/18 0540  Weight: 125.2 kg (276 lb) 129.4 kg (285 lb 4.4 oz) 131.6 kg (290 lb 2 oz)    History of present illness:  On 01/18/2018 by Dr. York Cerise Meltonis a 62 y.o.femalewith medical history significant ofthis type II with recent nausea and vomiting. She saw her doctor yesterday after being started on metformin and having had severe diarrhea since it was started. He did some blood work and found her creatinine to be elevated and told her to come into the emergency department. In the emergency department she was found to have a creatinine of 4.73 a BUN of 76 and a blood pressure of 76/54. Aggressive IV fluid rehydration has begun. Patient is thought to be perhaps mildly septic but it is unclear whether it is just dehydration or mild sepsis she does have a slightly elevated white blood cell count, she is hypotensive, her creatinine is elevated. States that she feels fairly poorly but has started to feel better since she received the IV fluid. She has been having some diarrhea which we will send to the lab for evaluation.  Hospital Course:    Hypotension -BP on admission was noted to be 76/54 -Responded to IV fluid hydration -Currently stable, 103/67 -Advised patient to hold her BP medications and to check her BP at home, keep a log, and discuss with PCP  Acute kidney injury -Creatinine 1 week ago was 0.8, on admission was 4.7 -Secondary to dehydration secondary to GI losses as well as poor oral intake versus medications including metformin and lisinopril -Creatinine today 1.04 -repeat BMP in one week  Nausea, vomiting, diarrhea -resolved -possibly gastroenteritis versus medication induced, patient recently started metformin 1 month ago -Patient no longer complaining of nausea and vomiting  -had 2 formed stools -C. difficile PCR negative -GI pathogen panel pending  Hypokalemia -Secondary to GI losses -Resolved with replacement  Dyslipidemia -Continue statin  Diabetes mellitus, type II -Hold metformin -Continue Januvia upon discharge  Hyponatremia -Secondary to dehydration.  Resolved with IV fluids, currently sodium 139  Hypothyroidism -Continue Synthroid  Procedures: None  Consultations: None  Discharge Exam: Vitals:   01/19/18 2034 01/20/18 0526  BP: 127/62 103/67  Pulse: 64 63  Resp: 17 17  Temp: 98.8 F (37.1 C) 98.5 F (36.9 C)  SpO2: 100% 99%     General: Well developed, well nourished, NAD, appears stated age  HEENT: NCAT, PERRLA, EOMI, Anicteic Sclera, mucous membranes moist.  Neck: Supple, no JVD, no masses  Cardiovascular: S1 S2 auscultated, no rubs, murmurs or gallops. Regular rate and rhythm.  Respiratory: Clear to auscultation bilaterally with equal chest rise  Abdomen: Soft, nontender, nondistended, + bowel sounds  Extremities: warm dry without cyanosis clubbing or edema  Neuro: AAOx3, cranial nerves grossly intact. Strength 5/5 in patient's  upper and lower extremities bilaterally  Skin: Without rashes exudates or nodules  Psych: Normal affect and demeanor  with intact judgement and insight  Discharge Instructions Discharge Instructions    Discharge instructions   Complete by:  As directed    Patient will be discharged to home.  Patient will need to follow up with primary care provider within one week of discharge, discuss blood pressure and diabetes control and repeat BMP.  Hold blood pressure medications until you discuss resumption with your primary care physician. Patient should continue medications as prescribed.  Patient should follow a heart healthy/carb modified diet.     Allergies as of 01/20/2018      Reactions   Dyazide [hydrochlorothiazide W-triamterene] Other (See Comments)   Intolerance - unknown reaction      Medication List    STOP taking these medications   baclofen 10 MG tablet Commonly known as:  LIORESAL   famotidine 20 MG tablet Commonly known as:  PEPCID   ondansetron 4 MG disintegrating tablet Commonly known as:  ZOFRAN ODT   ondansetron 8 MG disintegrating tablet Commonly known as:  ZOFRAN ODT   pantoprazole 40 MG tablet Commonly known as:  PROTONIX   promethazine 25 MG suppository Commonly known as:  PHENERGAN   promethazine 25 MG tablet Commonly known as:  PHENERGAN   sucralfate 1 GM/10ML suspension Commonly known as:  CARAFATE     TAKE these medications   acetaminophen 500 MG tablet Commonly known as:  TYLENOL Take 1,000 mg by mouth every 6 (six) hours as needed for headache (pain).   amLODipine 10 MG tablet Commonly known as:  NORVASC Take 1 tablet (10 mg total) by mouth daily. Hold, discuss BP management with PCP What changed:  additional instructions   aspirin EC 81 MG tablet Take 81 mg by mouth daily.   Astaxanthin 5 MG Caps Take 5 mg by mouth daily.   atenolol 100 MG tablet Commonly known as:  TENORMIN Take 1 tablet (100 mg total) by mouth daily. Hold, discuss BP management with PCP What changed:  additional instructions   CITRACAL SLOW RELEASE PO Take 1 tablet by mouth  daily.   furosemide 20 MG tablet Commonly known as:  LASIX Take 1 tablet (20 mg total) by mouth daily as needed. What changed:  when to take this   latanoprost 0.005 % ophthalmic solution Commonly known as:  XALATAN Place 1 drop into both eyes at bedtime.   levothyroxine 175 MCG tablet Commonly known as:  SYNTHROID, LEVOTHROID Take 175 mcg by mouth daily before breakfast.   lisinopril 20 MG tablet Commonly known as:  PRINIVIL,ZESTRIL Take 1 tablet (20 mg total) by mouth daily. Hold, discuss BP management with PCP What changed:  additional instructions   potassium chloride 10 MEQ tablet Commonly known as:  K-DUR,KLOR-CON Take 10 mEq by mouth 2 (two) times daily.   simvastatin 20 MG tablet Commonly known as:  ZOCOR Take 20 mg by mouth at bedtime.   sitaGLIPtin 100 MG tablet Commonly known as:  JANUVIA Take 100 mg by mouth daily.   Vitamin D3 2000 units Tabs Take 2,000 Units by mouth daily.      Allergies  Allergen Reactions  . Dyazide [Hydrochlorothiazide W-Triamterene] Other (See Comments)    Intolerance - unknown reaction   Follow-up Information    Polite, Ronald, MD. Schedule an appointment as soon as possible for a visit in 1 week(s).   Specialty:  Internal Medicine Why:  Hospital followup Contact information: [address] [place] [address] [phone]            The results of significant diagnostics from this hospitalization (including imaging, microbiology, ancillary and laboratory) are listed below for reference.    Significant Diagnostic Studies: Dg Abd Acute W/chest  Result Date: 01/12/2018 CLINICAL DATA:  Chills with emesis EXAM: DG ABDOMEN ACUTE W/ 1V CHEST COMPARISON:  Abdomen series January 20, 2017 FINDINGS: PA chest: There is no edema or consolidation. Heart size and pulmonary vascularity are normal. No adenopathy. There is aortic atherosclerosis. Supine and upright abdomen: There is a generalized paucity of gas in the  abdomen pelvis. There is no bowel dilatation or air-fluid level to suggest bowel obstruction. No free air. There are vascular calcifications in the pelvis. IMPRESSION: Paucity of bowel gas in the abdomen pelvis. While this finding may be seen normally, it does raise concern for early ileus or enteritis. Bowel obstruction is possible but less likely. No free air. No lung edema or consolidation. There is aortic atherosclerosis. Aortic Atherosclerosis (ICD10-I70.0). Electronically Signed   By: Lowella Grip III M.D.   On: 01/12/2018 20:41    Microbiology: Recent Results (from the past 240 hour(s))  Blood Culture (routine x 2)     Status: None (Preliminary result)   Collection Time: 01/18/18  2:07 PM  Result Value Ref Range Status   Specimen Description BLOOD RIGHT ANTECUBITAL  Final   Special Requests   Final    BOTTLES DRAWN AEROBIC AND ANAEROBIC Blood Culture adequate volume   Culture   Final    NO GROWTH 1 DAY Performed at [facility], [address] [place], [place] [address]    Report Status PENDING  Incomplete  Blood Culture (routine x 2)     Status: None (Preliminary result)   Collection Time: 01/18/18  3:54 PM  Result Value Ref Range Status   Specimen Description BLOOD LEFT ANTECUBITAL  Final   Special Requests   Final    BOTTLES DRAWN AEROBIC AND ANAEROBIC Blood Culture results may not be optimal due to an inadequate volume of blood received in culture bottles   Culture   Final    NO GROWTH < 24 HOURS Performed at [facility], [place] [address], [place], [place] [address]    Report Status PENDING  Incomplete  C difficile quick scan w PCR reflex     Status: None   Collection Time: 01/19/18 12:30 PM  Result Value Ref Range Status   C Diff antigen NEGATIVE NEGATIVE Final   C Diff toxin NEGATIVE NEGATIVE Final   C Diff interpretation No C. difficile detected.  Final    Comment: Performed at [facility], [place] [address], [place], [place] [address]      Labs: Basic Metabolic Panel: Recent Labs  Lab 01/18/18 1317 01/19/18 0538 01/20/18 0637  NA 130* 137 139  K 3.7 4.2 4.6  CL 95* 110 114*  CO2 20* 18* 20*  GLUCOSE 136* 97 108*  BUN 76* 57* 26*  CREATININE 4.73* 2.18* 1.04*  CALCIUM 9.2 8.0* 8.5*  MG 2.4 2.4  --    Liver Function Tests: Recent Labs  Lab 01/18/18 1317  AST 17  ALT 24  ALKPHOS 103  BILITOT 0.7  PROT 7.5  ALBUMIN 3.8   No results for input(s): LIPASE, AMYLASE in the last 168 hours. No results for input(s): AMMONIA in the last 168 hours. CBC: Recent Labs  Lab 01/18/18 1317 01/19/18 0538  WBC  11.0* 7.8  NEUTROABS 5.7  --   HGB 13.9 11.1*  HCT 42.6 34.8*  MCV 81.5 83.3  PLT 306 235   Cardiac Enzymes: No results for input(s): CKTOTAL, CKMB, CKMBINDEX, TROPONINI in the last 168 hours. BNP: BNP (last 3 results) No results for input(s): BNP in the last 8760 hours.  ProBNP (last 3 results) No results for input(s): PROBNP in the last 8760 hours.  CBG: Recent Labs  Lab 01/19/18 0753 01/19/18 1145 01/19/18 1651 01/20/18 0109 01/20/18 0801  GLUCAP 109* 150* 100* 102* 101*       Signed:  Janique Hoefer  [facility] 01/20/2018, 10:27 AM

## 2018-01-20 NOTE — Progress Notes (Signed)
1715:  Dr. Ree Kida stated that the [facility] detected was OK and no further action needed to be taken.

## 2018-01-20 NOTE — Progress Notes (Addendum)
Patient provided with discharge instructions. She verbalizes understanding of all discharge instructions including discharge medications and follow up MD visits. Patient waiting for ride to arrive. Will continue to monitor.   1114 Patient discharged and transported by spouse.

## 2018-01-20 NOTE — Progress Notes (Signed)
At 1700:  [facility] called me and stated that stool grew [facility], pt discharged earlier today.  Texted Dr. Ree Kida.

## 2018-01-23 LAB — CULTURE, BLOOD (ROUTINE X 2)
CULTURE: NO GROWTH
Culture: NO GROWTH
Special Requests: ADEQUATE

## 2018-07-28 IMAGING — DX DG ABDOMEN ACUTE W/ 1V CHEST
4 series · 4 of 4 positions shown · non-contrast
Comparison: Radiographs of July 26, 2015.

CLINICAL DATA: Nausea, vomiting.

EXAM:
DG ABDOMEN ACUTE W/ 1V CHEST

[chest pa]
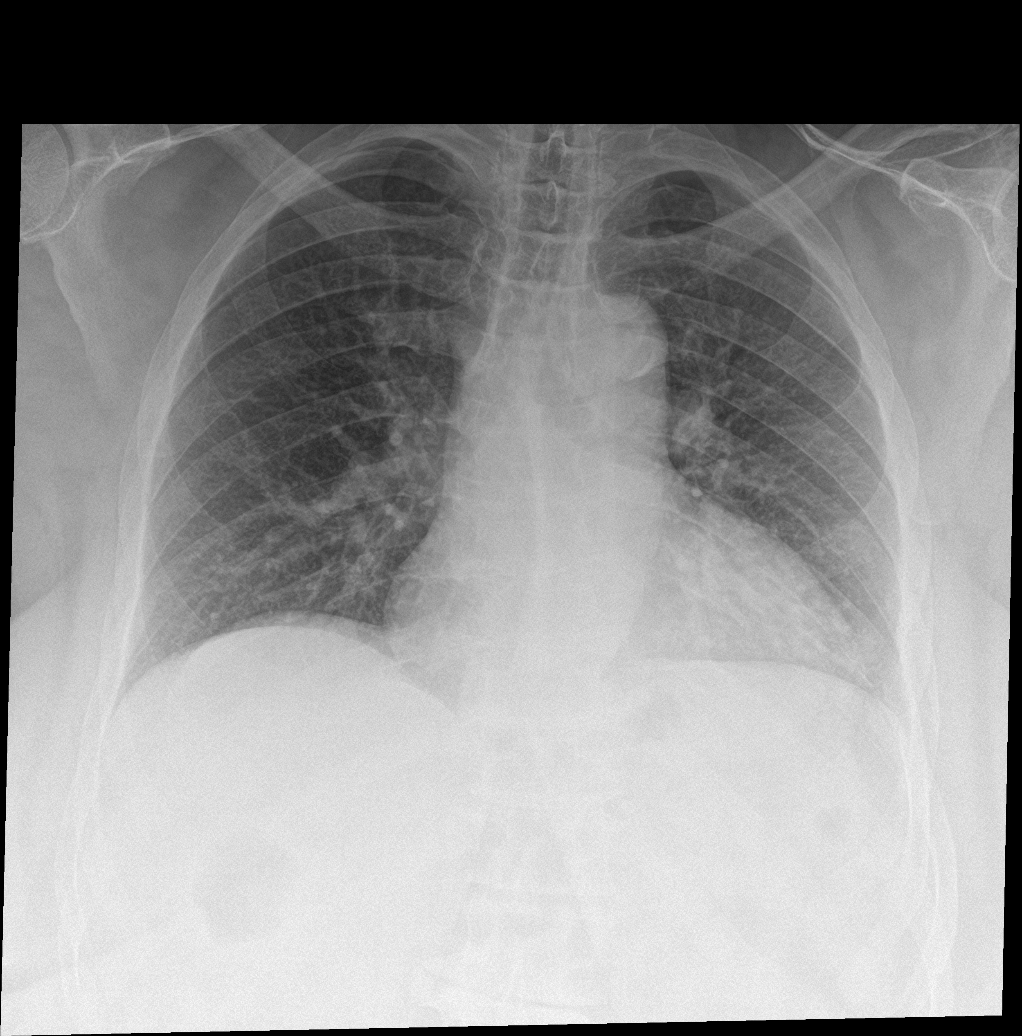

[abdomen erect]
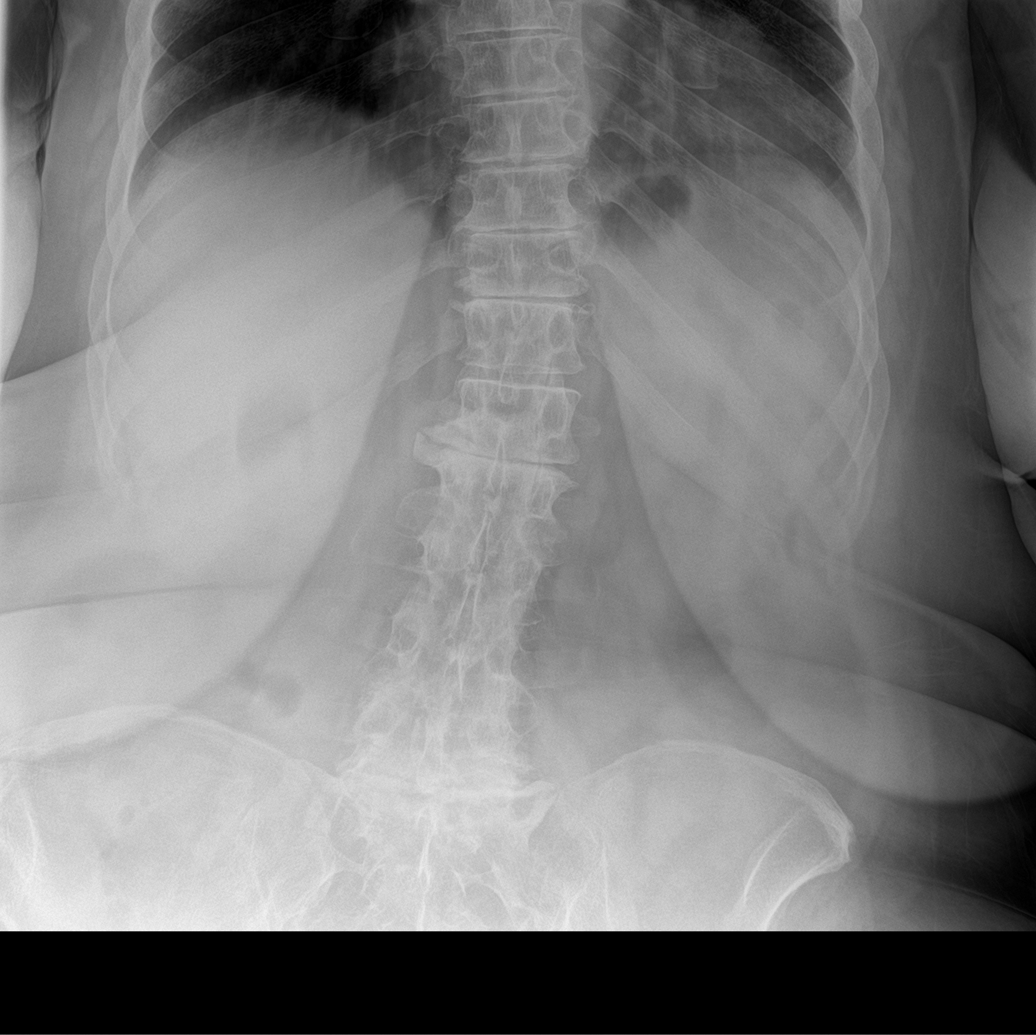

[abdomen supine (1 of 2)]
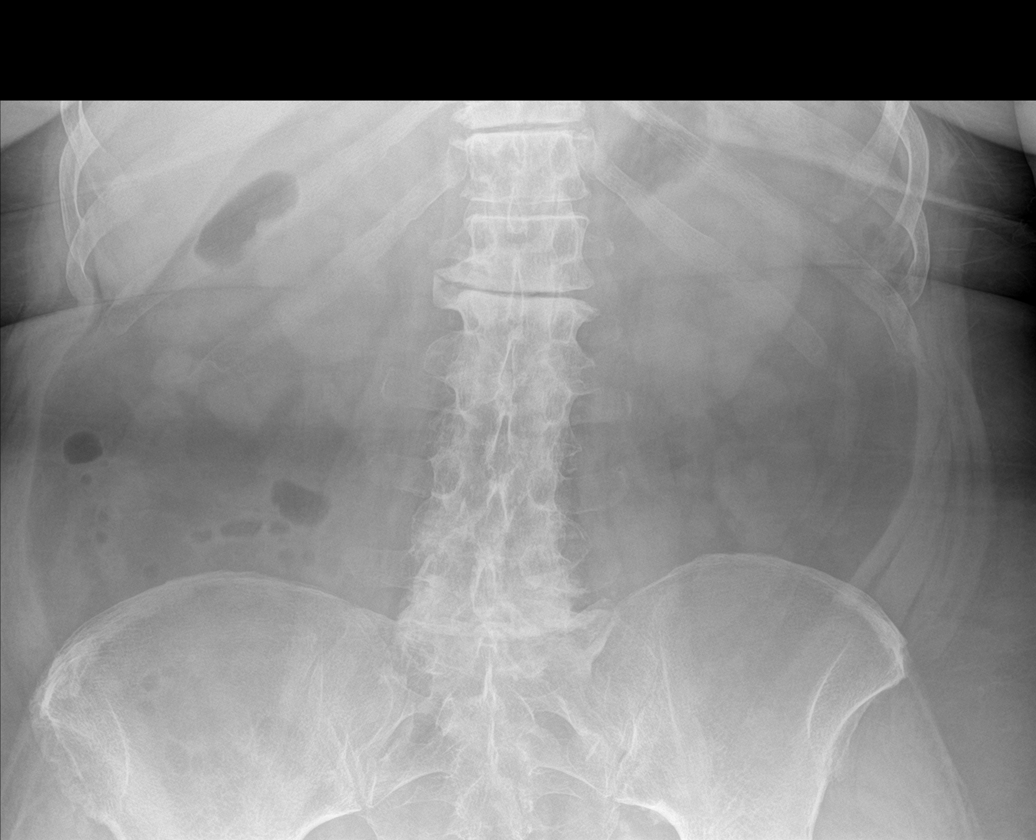

[abdomen supine (2 of 2)]
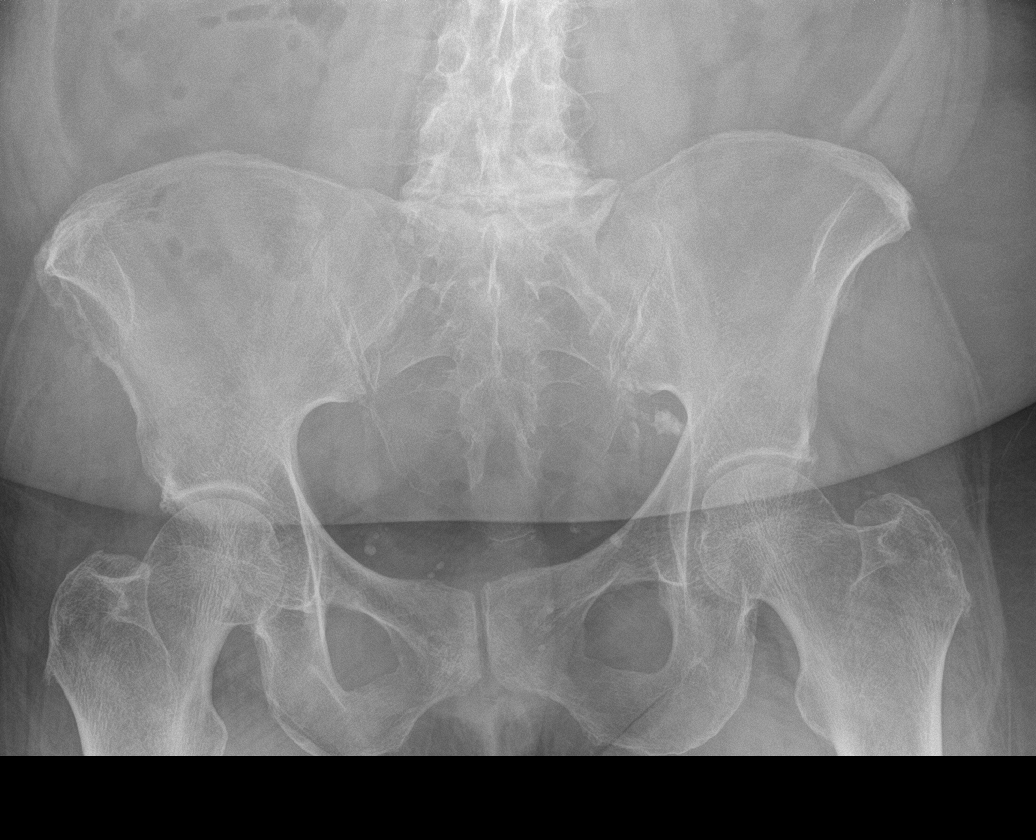

[4 of 4 positions shown; findings below may reference images not displayed]

FINDINGS: There is no evidence of dilated bowel loops or free intraperitoneal
air. Phleboliths are noted in the pelvis. Heart size and mediastinal
contours are within normal limits. Both lungs are clear.
IMPRESSION: No evidence of bowel obstruction or ileus. No acute cardiopulmonary
disease.

## 2018-10-20 ENCOUNTER — Other Ambulatory Visit: Payer: Self-pay

## 2018-10-20 ENCOUNTER — Emergency Department (HOSPITAL_COMMUNITY): Payer: BC Managed Care – PPO

## 2018-10-20 ENCOUNTER — Emergency Department (HOSPITAL_COMMUNITY)
Admission: EM | Admit: 2018-10-20 | Discharge: 2018-10-20 | Disposition: A | Discharge status: Home / Self Care | Payer: BC Managed Care – PPO | Attending: Emergency Medicine | Admitting: Emergency Medicine

## 2018-10-20 ENCOUNTER — Encounter (HOSPITAL_COMMUNITY): Payer: Self-pay | Admitting: Emergency Medicine

## 2018-10-20 DIAGNOSIS — E876 Hypokalemia: Secondary | ICD-10-CM | POA: Diagnosis not present

## 2018-10-20 DIAGNOSIS — Z7984 Long term (current) use of oral hypoglycemic drugs: Secondary | ICD-10-CM | POA: Diagnosis not present

## 2018-10-20 DIAGNOSIS — Z87891 Personal history of nicotine dependence: Secondary | ICD-10-CM | POA: Insufficient documentation

## 2018-10-20 DIAGNOSIS — Z79899 Other long term (current) drug therapy: Secondary | ICD-10-CM | POA: Diagnosis not present

## 2018-10-20 DIAGNOSIS — E119 Type 2 diabetes mellitus without complications: Secondary | ICD-10-CM | POA: Insufficient documentation

## 2018-10-20 DIAGNOSIS — D259 Leiomyoma of uterus, unspecified: Secondary | ICD-10-CM | POA: Insufficient documentation

## 2018-10-20 DIAGNOSIS — I10 Essential (primary) hypertension: Secondary | ICD-10-CM | POA: Diagnosis not present

## 2018-10-20 DIAGNOSIS — Z7982 Long term (current) use of aspirin: Secondary | ICD-10-CM | POA: Diagnosis not present

## 2018-10-20 DIAGNOSIS — E039 Hypothyroidism, unspecified: Secondary | ICD-10-CM | POA: Diagnosis not present

## 2018-10-20 DIAGNOSIS — R112 Nausea with vomiting, unspecified: Secondary | ICD-10-CM | POA: Insufficient documentation

## 2018-10-20 DIAGNOSIS — E860 Dehydration: Secondary | ICD-10-CM | POA: Diagnosis not present

## 2018-10-20 LAB — URINALYSIS, ROUTINE W REFLEX MICROSCOPIC
Bacteria, UA: NONE SEEN
Bilirubin Urine: NEGATIVE
Glucose, UA: >=500 mg/dL — AB
Hgb urine dipstick: NEGATIVE
Ketones, ur: 5 mg/dL — AB
Leukocytes,Ua: NEGATIVE
Nitrite: NEGATIVE
Protein, ur: 100 mg/dL — AB
Specific Gravity, Urine: 1.013 (ref 1.005–1.030)
pH: 8 (ref 5.0–8.0)

## 2018-10-20 LAB — COMPREHENSIVE METABOLIC PANEL
ALT: 18 U/L (ref 0–44)
AST: 19 U/L (ref 15–41)
Albumin: 4.6 g/dL (ref 3.5–5.0)
Alkaline Phosphatase: 130 U/L — ABNORMAL HIGH (ref 38–126)
Anion gap: 17 — ABNORMAL HIGH (ref 5–15)
BUN: 9 mg/dL (ref 8–23)
CO2: 22 mmol/L (ref 22–32)
Calcium: 9.8 mg/dL (ref 8.9–10.3)
Chloride: 97 mmol/L — ABNORMAL LOW (ref 98–111)
Creatinine, Ser: 0.79 mg/dL (ref 0.44–1.00)
GFR calc Af Amer: >60 mL/min (ref >60)
GFR calc non Af Amer: >60 mL/min (ref >60)
Glucose, Bld: 282 mg/dL — ABNORMAL HIGH (ref 70–99)
Potassium: 3 mmol/L — ABNORMAL LOW (ref 3.5–5.1)
Sodium: 136 mmol/L (ref 135–145)
Total Bilirubin: 0.4 mg/dL (ref 0.3–1.2)
Total Protein: 8.9 g/dL — ABNORMAL HIGH (ref 6.5–8.1)

## 2018-10-20 LAB — CBC WITH DIFFERENTIAL/PLATELET
Abs Immature Granulocytes: 0.06 10*3/uL (ref 0.00–0.07)
Basophils Absolute: 0 10*3/uL (ref 0.0–0.1)
Basophils Relative: 0 %
Eosinophils Absolute: 0.1 10*3/uL (ref 0.0–0.5)
Eosinophils Relative: 1 %
HCT: 45.9 % (ref 36.0–46.0)
Hemoglobin: 14.7 g/dL (ref 12.0–15.0)
Immature Granulocytes: 1 %
Lymphocytes Relative: 10 %
Lymphs Abs: 1.2 10*3/uL (ref 0.7–4.0)
MCH: 27.5 pg (ref 26.0–34.0)
MCHC: 32 g/dL (ref 30.0–36.0)
MCV: 86 fL (ref 80.0–100.0)
Monocytes Absolute: 0.3 10*3/uL (ref 0.1–1.0)
Monocytes Relative: 2 %
Neutro Abs: 10.9 10*3/uL — ABNORMAL HIGH (ref 1.7–7.7)
Neutrophils Relative %: 86 %
Platelets: 320 10*3/uL (ref 150–400)
RBC: 5.34 MIL/uL — ABNORMAL HIGH (ref 3.87–5.11)
RDW: 15.6 % — ABNORMAL HIGH (ref 11.5–15.5)
WBC: 12.5 10*3/uL — ABNORMAL HIGH (ref 4.0–10.5)
nRBC: 0 % (ref 0.0–0.2)

## 2018-10-20 LAB — RAPID URINE DRUG SCREEN, HOSP PERFORMED
Amphetamines: NOT DETECTED
Barbiturates: NOT DETECTED
Benzodiazepines: NOT DETECTED
Cocaine: NOT DETECTED
Opiates: NOT DETECTED
Tetrahydrocannabinol: POSITIVE — AB

## 2018-10-20 LAB — CBG MONITORING, ED
Glucose-Capillary: 258 mg/dL — ABNORMAL HIGH (ref 70–99)
Glucose-Capillary: 225 mg/dL — ABNORMAL HIGH (ref 70–99)

## 2018-10-20 LAB — LIPASE, BLOOD: Lipase: 21 U/L (ref 11–51)

## 2018-10-20 MED ORDER — LIDOCAINE VISCOUS HCL 2 % MT SOLN
15.0000 mL | Freq: Once | OROMUCOSAL | Status: AC
  Administered 2018-10-20: 15 mL via ORAL
  Filled 2018-10-20: qty 15

## 2018-10-20 MED ORDER — PROMETHAZINE HCL 25 MG/ML IJ SOLN
12.5000 mg | Freq: Once | INTRAMUSCULAR | Status: AC
  Administered 2018-10-20: 12.5 mg via INTRAVENOUS
  Filled 2018-10-20: qty 1

## 2018-10-20 MED ORDER — PROMETHAZINE HCL 25 MG RE SUPP: 25.0000 mg | Freq: Three times a day (TID) | RECTAL | 0 refills | Status: DC | PRN

## 2018-10-20 MED ORDER — AMLODIPINE BESYLATE 5 MG PO TABS
10.0000 mg | ORAL_TABLET | Freq: Once | ORAL | Status: AC
  Administered 2018-10-20: 14:00:00 10 mg via ORAL
  Filled 2018-10-20: qty 2

## 2018-10-20 MED ORDER — LISINOPRIL 20 MG PO TABS
20.0000 mg | ORAL_TABLET | Freq: Once | ORAL | Status: AC
  Administered 2018-10-20: 14:00:00 20 mg via ORAL
  Filled 2018-10-20: qty 1

## 2018-10-20 MED ORDER — PROMETHAZINE HCL 25 MG PO TABS: 25.0000 mg | ORAL_TABLET | Freq: Four times a day (QID) | ORAL | 0 refills | Status: DC | PRN

## 2018-10-20 MED ORDER — SODIUM CHLORIDE 0.9 % IV BOLUS
1000.0000 mL | Freq: Once | INTRAVENOUS | Status: AC
  Administered 2018-10-20: 12:00:00 1000 mL via INTRAVENOUS

## 2018-10-20 MED ORDER — POTASSIUM CHLORIDE 10 MEQ/100ML IV SOLN
10.0000 meq | Freq: Once | INTRAVENOUS | Status: AC
  Administered 2018-10-20: 10 meq via INTRAVENOUS
  Filled 2018-10-20: qty 100

## 2018-10-20 MED ORDER — SODIUM CHLORIDE 0.9 % IV BOLUS
1000.0000 mL | Freq: Once | INTRAVENOUS | Status: AC
  Administered 2018-10-20: 14:00:00 1000 mL via INTRAVENOUS

## 2018-10-20 MED ORDER — IOHEXOL 300 MG/ML  SOLN
100.0000 mL | Freq: Once | INTRAMUSCULAR | Status: AC | PRN
  Administered 2018-10-20: 17:00:00 100 mL via INTRAVENOUS

## 2018-10-20 MED ORDER — ALUM & MAG HYDROXIDE-SIMETH 200-200-20 MG/5ML PO SUSP
30.0000 mL | Freq: Once | ORAL | Status: AC
  Administered 2018-10-20: 30 mL via ORAL
  Filled 2018-10-20: qty 30

## 2018-10-20 MED ORDER — ATENOLOL 50 MG PO TABS
100.0000 mg | ORAL_TABLET | Freq: Once | ORAL | Status: AC
  Administered 2018-10-20: 100 mg via ORAL
  Filled 2018-10-20: qty 2

## 2018-10-20 MED ORDER — ONDANSETRON HCL 4 MG/2ML IJ SOLN
INTRAMUSCULAR | Status: AC
  Filled 2018-10-20: qty 2

## 2018-10-20 NOTE — Discharge Instructions (Signed)
You have been seen today for nausea and vomiting. Please read and follow all provided instructions. Return to the emergency room for worsening condition or new concerning symptoms.    Your potassium today was slightly low.  It was 3.0.  We gave you IV potassium and recommend you follow-up with PCP to have this rechecked within 1 week.  1. Medications:  Prescription to your pharmacy for Phenergan.  This is a medicine you can take for nausea.  I included both pills to take by mouth and also the suppository version.  If you are unable to tolerate food and with you can use the suppository to help control the nausea.  Continue usual home medications Take medications as prescribed. Please review all of the medicines and only take them if you do not have an allergy to them.  2. Treatment: rest, drink plenty of fluids  3. Follow Up: Please follow up with your primary doctor within 1 week for further evaluation of today's visit and to have your blood pressure rechecked.  It was elevated today however we suspect it was because you vomited after taking your pills today.  To include information for [facility].  You can follow-up outpatient if needed.  It is also a possibility that you have an allergic reaction to any of the medicines that you have been prescribed - Everybody reacts differently to medications and while MOST people have no trouble with most medicines, you may have a reaction such as nausea, vomiting, rash, swelling, shortness of breath. If this is the case, please stop taking the medicine immediately and contact your physician.  ?

## 2018-10-20 NOTE — ED Notes (Signed)
Pt given PO fluids at this time 

## 2018-10-20 NOTE — ED Provider Notes (Signed)
[facility] EMERGENCY DEPARTMENT Provider Note   CSN: [id number] Arrival date & time: 10/20/18  1112    History   Chief Complaint Chief Complaint  Patient presents with  . Nausea  . Emesis    HPI Sara [name] is a 63 y.o. female with history of hyperlipidemia, diabetes on Januvia, hypothyroidism on Synthroid, hypertension is here for evaluation of concern for dehydration.  Her mouth is dry.  Reports sudden onset of nausea last night, she took her nighttime meds but a few minutes later threw them back up.  Has not been able to tolerate any fluid or food in the last 12+ hours.  Woke up this morning and again was unable to keep her medicines down and threw up again.  Has had similar symptoms in the past and one time she had to get admitted for severe dehydration and acute kidney injury.  Has been doing housework all weekend and feels like she may have overdid it.  In the past has seen gastroenterology Dr. Thana [name].  Last EGD 2 years ago and was told it was normal.  Compliant with daily PPI.  Smokes marijuana 2-3 times a week, last time was Tuesday.  1-2 alcoholic beverages 3 times a week.  Has followed up with PCP Dr. Delfina [name] and has been unable to determine why she has recurrent episodes of nausea, vomiting.  No associated fever, headache, vision changes, chest pain, shortness of breath, abdominal pain, diarrhea, constipation, dysuria, frequency, hematemesis, coffee-ground emesis, melena.  No sick contacts.  No exposure to suspicious food, travel.  No recent medication changes.  No exposure to suspected or confirmed COVID-19.    HPI  Past Medical History:  Diagnosis Date  . High cholesterol   . Hypertension   . Hypothyroidism   . Nausea and vomiting since 08/07/2016  . Type II diabetes mellitus [facility])     Patient Active Problem List   Diagnosis Date Noted  . Hypovolemic shock ([place]) 01/18/2018  . Nausea and vomiting 01/18/2018  . Diarrhea 01/18/2018  .  Hyponatremia 01/18/2018  . Dyslipidemia associated with type 2 diabetes mellitus ([place]) 08/19/2016  . Acute kidney failure ([place]) 08/18/2016  . Hypokalemia 08/08/2016  . Hypothyroidism 07/04/2011  . HTN (hypertension) 07/04/2011    Past Surgical History:  Procedure Laterality Date  . COLONOSCOPY  08/07/2016  . DILATION AND CURETTAGE OF UTERUS    . ESOPHAGOGASTRODUODENOSCOPY  07/06/2011   Procedure: ESOPHAGOGASTRODUODENOSCOPY (EGD);  Surgeon: Beryle Beams, MD;  Location: [facility] ENDOSCOPY;  Service: Endoscopy;  Laterality: N/A;  . OOPHORECTOMY Right 1990s   "& cyst w/it"  . TOTAL THYROIDECTOMY  2006     OB History   No obstetric history on file.      Home Medications    Prior to Admission medications   Medication Sig Start Date End Date Taking? Authorizing Provider  acetaminophen (TYLENOL) 500 MG tablet Take 1,000 mg by mouth every 6 (six) hours as needed for headache (pain).   Yes [provider]  amLODipine (NORVASC) 10 MG tablet Take 1 tablet (10 mg total) by mouth daily. Hold, discuss BP management with PCP 01/20/18  Yes Mikhail, [place], DO  aspirin EC 81 MG tablet Take 81 mg by mouth daily.   Yes [provider]  Astaxanthin 5 MG CAPS Take 5 mg by mouth daily.   Yes [provider]  atenolol (TENORMIN) 100 MG tablet Take 1 tablet (100 mg total) by mouth daily. Hold, discuss BP management with PCP 01/20/18  Yes Mikhail, [place], DO  Calcium-Magnesium-Vitamin D (CITRACAL SLOW RELEASE PO) Take 1 tablet by mouth daily.   Yes [provider]  Cholecalciferol (VITAMIN D3) 2000 UNITS TABS Take 2,000 Units by mouth daily.    Yes [provider]  latanoprost (XALATAN) 0.005 % ophthalmic solution Place 1 drop into both eyes at bedtime. 01/16/18  Yes [provider]  levothyroxine (SYNTHROID, LEVOTHROID) 175 MCG tablet Take 175 mcg by mouth daily before breakfast.   Yes [provider]  lisinopril (PRINIVIL,ZESTRIL) 20 MG tablet  Take 1 tablet (20 mg total) by mouth daily. Hold, discuss BP management with PCP 01/20/18  Yes Mikhail, [place], DO  potassium chloride (K-DUR,KLOR-CON) 10 MEQ tablet Take 10 mEq by mouth 2 (two) times daily.    Yes [provider]  simvastatin (ZOCOR) 20 MG tablet Take 20 mg by mouth at bedtime.    Yes [provider]  sitaGLIPtin (JANUVIA) 100 MG tablet Take 100 mg by mouth daily.   Yes [provider]  furosemide (LASIX) 20 MG tablet Take 1 tablet (20 mg total) by mouth daily as needed. Patient not taking: Reported on 10/20/2018 08/20/16   Theodis Blaze, MD    Family History Family History  Problem Relation Age of Onset  . Diabetes Mellitus II Mother     Social History Social History   Tobacco Use  . Smoking status: Former Smoker    Packs/day: 0.50    Years: 12.00    Pack years: 6.00    Types: Cigarettes    Last attempt to quit: 1991    Years since quitting: 29.3  . Smokeless tobacco: Never Used  Substance Use Topics  . Alcohol use: Yes    Alcohol/week: 1.0 standard drinks    Types: 1 Cans of beer per week  . Drug use: Yes    Types: Marijuana     Allergies   Dyazide [hydrochlorothiazide w-triamterene]   Review of Systems Review of Systems  Gastrointestinal: Positive for nausea and vomiting.  All other systems reviewed and are negative.    Physical Exam Updated Vital Signs BP (!) 198/98   Pulse 72   Temp 98.1 F (36.7 C) (Oral)   Resp 15   Wt 128.4 kg   SpO2 100%   BMI 39.47 kg/m   Physical Exam Vitals signs and nursing note reviewed.  Constitutional:      Appearance: She is well-developed.     Comments: Non toxic in NAD  HENT:     Head: Normocephalic and atraumatic.     Nose: Nose normal.     Mouth/Throat:     Mouth: Mucous membranes are dry.     Comments: Dry MM and lips  Eyes:     Conjunctiva/sclera: Conjunctivae normal.  Neck:     Musculoskeletal: Normal range of motion.  Cardiovascular:     Rate and Rhythm:  Normal rate and regular rhythm.  Pulmonary:     Effort: Pulmonary effort is normal.     Breath sounds: Normal breath sounds.  Abdominal:     General: Bowel sounds are normal.     Palpations: Abdomen is soft.     Tenderness: There is no abdominal tenderness.     Comments: No G/R/R. No suprapubic or CVA tenderness. Negative Murphy's and McBurney's. Active BS to lower quadrants.   Musculoskeletal: Normal range of motion.  Skin:    General: Skin is warm and dry.     Capillary Refill: Capillary refill takes less than 2 seconds.  Neurological:     Mental Status: She is alert.  Psychiatric:        Behavior: Behavior normal.      ED Treatments / Results  Labs (all labs ordered are listed, but only abnormal results are displayed) Labs Reviewed  CBC WITH DIFFERENTIAL/PLATELET - Abnormal; Notable for the following components:      Result Value   WBC 12.5 (*)    RBC 5.34 (*)    RDW 15.6 (*)    Neutro Abs 10.9 (*)    All other components within normal limits  COMPREHENSIVE METABOLIC PANEL - Abnormal; Notable for the following components:   Potassium 3.0 (*)    Chloride 97 (*)    Glucose, Bld 282 (*)    Total Protein 8.9 (*)    Alkaline Phosphatase 130 (*)    Anion gap 17 (*)    All other components within normal limits  URINALYSIS, ROUTINE W REFLEX MICROSCOPIC - Abnormal; Notable for the following components:   Color, Urine STRAW (*)    Glucose, UA >=500 (*)    Ketones, ur 5 (*)    Protein, ur 100 (*)    All other components within normal limits  RAPID URINE DRUG SCREEN, HOSP PERFORMED - Abnormal; Notable for the following components:   Tetrahydrocannabinol POSITIVE (*)    All other components within normal limits  CBG MONITORING, ED - Abnormal; Notable for the following components:   Glucose-Capillary 258 (*)    All other components within normal limits  LIPASE, BLOOD    EKG EKG Interpretation  Date/Time:  Friday January 18 2018 14:20:06 EDT Ventricular Rate:  56 PR  Interval:    QRS Duration: 116 QT Interval:  476 QTC Calculation: 460 R Axis:   34 Text Interpretation:  Sinus rhythm Incomplete right bundle branch block Probable left ventricular hypertrophy No significant change since 7/18 Confirmed by Aletta Edouard [phone]) on 01/19/2018 10:36:46 AM Also confirmed by Aletta Edouard [phone]), editor Philomena Doheny [phone])  on 10/20/2018 12:30:58 PM   Radiology No results found.  Procedures Procedures (including critical care time)  Medications Ordered in ED Medications  ondansetron (ZOFRAN) 4 MG/2ML injection (has no administration in time range)  sodium chloride 0.9 % bolus 1,000 mL (1,000 mLs Intravenous New Bag/Given 10/20/18 1205)  alum & mag hydroxide-simeth (MAALOX/MYLANTA) 200-200-20 MG/5ML suspension 30 mL (30 mLs Oral Given 10/20/18 1235)    And  lidocaine (XYLOCAINE) 2 % viscous mouth solution 15 mL (15 mLs Oral Given 10/20/18 1235)     Initial Impression / Assessment and Plan / ED Course  I have reviewed the triage vital signs and the nursing notes.  Pertinent labs & imaging results that were available during my care of the patient were reviewed by me and considered in my medical decision making (see chart for details).  Clinical Course as of Oct 19 1300  Sun Oct 20, 2018  1213 Not taken BP meds since yesterday  BP(!): 194/101 [CG]    Clinical Course User Index [CG] Kinnie Feil, PA-C      DDX includes gastritis vs viral process vs cannabinoid hyperemesis syndrome.  Other than nausea/vomiting and dry mouth has negative ROS. No constitutional symptoms. No Cp/SOB. Similar episodes of same in the last 3 years.   Afebrile, with benign abd exam.Will get labs, UA, IVF and antiemetics. Reassess.   Final Clinical Impressions(s) / ED Diagnoses   1445: Labs so far unremarkable. Mild ketonuria may be from underlying diabetes or dehydration.  No BP or  januvia taken since yesterday due to n/v and this is likely contributing to elevated  BP reading here.  Nausea improved. Asymptomatic hypertension. Will hand pt to [facility] who will f/u on remaining labs, reassess. Anticipate discharge if clinical improvement.  If benign labs and clinical improvement I see no indication for imaging. Consider imaging if refractory symptoms or pain.  Final diagnoses:  Nausea and vomiting in adult    ED Discharge Orders    None       Arlean Hopping 10/20/18 [place], MD 10/21/18 2124

## 2018-10-20 NOTE — ED Notes (Signed)
ED Provider at bedside. 

## 2018-10-20 NOTE — ED Provider Notes (Signed)
Care assumed from C. Gibbons PA-C.  Please see her full H&P.  In short,  Sara [name] is a 63 y.o. female presents for dehydration x2 days.  Patient states she has not been taking in enough fluids.  She has had multiple episodes of nonbilious and nonbloody emesis, estimating 5 in the last 24 hours.  Patient vomited immediately after taking nighttime medication, attempting to eat breakfast this morning, as well as after attempting to take a.m. medications.  Labs are pending.  Physical Exam  BP (!) 193/106   Pulse 82   Temp 98.1 F (36.7 C) (Oral)   Resp 17   Wt 128.4 kg   SpO2 94%   BMI 39.47 kg/m   Physical Exam Vitals signs and nursing note reviewed.  Constitutional:      Appearance: She is well-developed. She is not toxic-appearing.  HENT:     Head: Normocephalic and atraumatic.     Nose: Nose normal.     Mouth/Throat:     Comments: Dry mucus membranes and lips Eyes:     General: No scleral icterus.       Right eye: No discharge.        Left eye: No discharge.     Conjunctiva/sclera: Conjunctivae normal.  Neck:     Musculoskeletal: Normal range of motion.  Cardiovascular:     Rate and Rhythm: Normal rate and regular rhythm.     Pulses: Normal pulses.     Heart sounds: Normal heart sounds.  Pulmonary:     Effort: Pulmonary effort is normal.     Breath sounds: Normal breath sounds.  Abdominal:     General: Bowel sounds are normal. There is no distension.     Palpations: Abdomen is soft.     Tenderness: There is no abdominal tenderness. There is no right CVA tenderness, left CVA tenderness, guarding or rebound.  Musculoskeletal: Normal range of motion.     Right lower leg: No edema.     Left lower leg: No edema.  Skin:    General: Skin is warm and dry.     Findings: No rash.  Neurological:     Mental Status: She is oriented to person, place, and time.     Comments: Fluent speech, no facial droop.  Psychiatric:        Behavior: Behavior normal.     ED  Course/Procedures   Results for orders placed or performed during the hospital encounter of 10/20/18 (from the past 24 hour(s))  CBG monitoring, ED     Status: Abnormal   Collection Time: 10/20/18 11:21 AM  Result Value Ref Range   Glucose-Capillary 258 (H) 70 - 99 mg/dL  Urinalysis, Routine w reflex microscopic     Status: Abnormal   Collection Time: 10/20/18 11:53 AM  Result Value Ref Range   Color, Urine STRAW (A) YELLOW   APPearance CLEAR CLEAR   Specific Gravity, Urine 1.013 1.005 - 1.030   pH 8.0 5.0 - 8.0   Glucose, UA >=500 (A) NEGATIVE mg/dL   Hgb urine dipstick NEGATIVE NEGATIVE   Bilirubin Urine NEGATIVE NEGATIVE   Ketones, ur 5 (A) NEGATIVE mg/dL   Protein, ur 100 (A) NEGATIVE mg/dL   Nitrite NEGATIVE NEGATIVE   Leukocytes,Ua NEGATIVE NEGATIVE   RBC / HPF 0-5 0 - 5 RBC/hpf   WBC, UA 0-5 0 - 5 WBC/hpf   Bacteria, UA NONE SEEN NONE SEEN  CBC with Differential     Status: Abnormal   Collection Time: 10/20/18  12:11 PM  Result Value Ref Range   WBC 12.5 (H) 4.0 - 10.5 K/uL   RBC 5.34 (H) 3.87 - 5.11 MIL/uL   Hemoglobin 14.7 12.0 - 15.0 g/dL   HCT 45.9 36.0 - 46.0 %   MCV 86.0 80.0 - 100.0 fL   MCH 27.5 26.0 - 34.0 pg   MCHC 32.0 30.0 - 36.0 g/dL   RDW 15.6 (H) 11.5 - 15.5 %   Platelets 320 150 - 400 K/uL   nRBC 0.0 0.0 - 0.2 %   Neutrophils Relative % 86 %   Neutro Abs 10.9 (H) 1.7 - 7.7 K/uL   Lymphocytes Relative 10 %   Lymphs Abs 1.2 0.7 - 4.0 K/uL   Monocytes Relative 2 %   Monocytes Absolute 0.3 0.1 - 1.0 K/uL   Eosinophils Relative 1 %   Eosinophils Absolute 0.1 0.0 - 0.5 K/uL   Basophils Relative 0 %   Basophils Absolute 0.0 0.0 - 0.1 K/uL   Immature Granulocytes 1 %   Abs Immature Granulocytes 0.06 0.00 - 0.07 K/uL  Comprehensive metabolic panel     Status: Abnormal   Collection Time: 10/20/18 12:11 PM  Result Value Ref Range   Sodium 136 135 - 145 mmol/L   Potassium 3.0 (L) 3.5 - 5.1 mmol/L   Chloride 97 (L) 98 - 111 mmol/L   CO2 22 22 - 32  mmol/L   Glucose, Bld 282 (H) 70 - 99 mg/dL   BUN 9 8 - 23 mg/dL   Creatinine, Ser 0.79 0.44 - 1.00 mg/dL   Calcium 9.8 8.9 - 10.3 mg/dL   Total Protein 8.9 (H) 6.5 - 8.1 g/dL   Albumin 4.6 3.5 - 5.0 g/dL   AST 19 15 - 41 U/L   ALT 18 0 - 44 U/L   Alkaline Phosphatase 130 (H) 38 - 126 U/L   Total Bilirubin 0.4 0.3 - 1.2 mg/dL   GFR calc non Af Amer >60 >60 mL/min   GFR calc Af Amer >60 >60 mL/min   Anion gap 17 (H) 5 - 15  Lipase, blood     Status: None   Collection Time: 10/20/18 12:11 PM  Result Value Ref Range   Lipase 21 11 - 51 U/L  Rapid urine drug screen (hospital performed)     Status: Abnormal   Collection Time: 10/20/18 12:12 PM  Result Value Ref Range   Opiates NONE DETECTED NONE DETECTED   Cocaine NONE DETECTED NONE DETECTED   Benzodiazepines NONE DETECTED NONE DETECTED   Amphetamines NONE DETECTED NONE DETECTED   Tetrahydrocannabinol POSITIVE (A) NONE DETECTED   Barbiturates NONE DETECTED NONE DETECTED    EKG Interpretation  Date/Time:  Friday January 18 2018 14:20:06 EDT Ventricular Rate:  56 PR Interval:    QRS Duration: 116 QT Interval:  476 QTC Calculation: 460 R Axis:   34 Text Interpretation:  Sinus rhythm Incomplete right bundle branch block Probable left ventricular hypertrophy No significant change since 7/18 Confirmed by Aletta Edouard [phone]) on 01/19/2018 10:36:46 AM Also confirmed by Aletta Edouard [phone]), editor Philomena Doheny [phone])  on 10/20/2018 12:30:58 PM      CT ABDOMEN AND PELVIS WITH CONTRAST TECHNIQUE: Multidetector CT imaging of the abdomen and pelvis was performed using the standard protocol following bolus administration of intravenous contrast.CONTRAST: 145mL OMNIPAQUE IOHEXOL 300 MG/ML IV. COMPARISON: None. FINDINGS: Lower chest: Heart size normal. Visualized lung bases clear. Hepatobiliary: Benign cysts involving the MEDIAL segment LEFT lobe and POSTERIOR segment RIGHT lobe of the liver,  the largest measuring approximately 2 cm  in the RIGHT lobe. No significant parenchymal abnormality involving the liver. Gallbladder normal in appearance without calcified gallstones. No biliary ductal dilation. Pancreas: Normal in appearance without evidence of mass, ductal dilation, or inflammation. Spleen: Normal in size and appearance. Adrenals/Urinary Tract: Normal appearing adrenal glands. Kidneys normal in size and appearance without focal parenchymal abnormality. No hydronephrosis. No evidence of urinary tract calculi. Normal appearing urinary bladder. Stomach/Bowel: Stomach normal in appearance for the degree of distention. Normal-appearing small bowel. Entire colon decompressed which accounts for apparent wall thickening diffusely; no pericolonic edema/inflammation. Normal-appearing long appendix in the RIGHT UPPER and mid pelvis with opaque ingested material or appendicoliths. Vascular/Lymphatic: Severe aorto-iliofemoral atherosclerosis without evidence of aneurysm. No pathologic lymphadenopathy. Reproductive: Small calcified degenerated fibroid arising from the ANTERIOR fundus. Otherwise normal appearing uterus. Surgically absent RIGHT ovary. Normal-appearing LEFT ovary. No adnexal masses. Other: Numerous pelvic phleboliths. Musculoskeletal: Fusion of the vertebral bodies and possibly also posterior elements from L2 through L5 which is likely acquired rather than surgical. Severe multifactorial spinal stenosis at the L2-3 level and moderate multifactorial spinal stenosis at the L5-S1level. Degenerative disc disease and spondylosis involving the visualized thoracic spine and at L1-2. No acute findings. IMPRESSION: 1. No acute abnormalities involving the abdomen or pelvis. 2. Benign hepatic cysts. 3. Small calcified degenerated uterine fundal fibroid. 4. Osseous findings as above. Aortic Atherosclerosis (ICD10-170.0) Electronically Signed By: Evangeline Dakin M.D. On: 10/20/2018 17:36      Clinical Course as of Oct 19 1712  Sun Oct 20, 2018   1213 Not taken BP meds since yesterday  BP(!): 194/101 [CG]    Clinical Course User Index [CG] Kinnie Feil, PA-C      MDM   63 year old female presents with nausea and emesis, approximately 5 episodes in the last 24 hours.  She has been seen for similar episodes in the last 3 years.  She is afebrile, overall well-appearing.  She continues to deny any chest pain, abdominal pain, shortness of breath, diarrhea.  Her exam is unremarkable, besides dry mouth.  Her abdomen is benign, no peritoneal signs.  Work-up started by previous provider included CBC, CMP, UDS, UA.  Patient is hypertensive.  She has vomited after attempting to take her medications last night and again this morning.  Work-up shows leukocytosis of 12.5, possible stress response given multiple episodes of vomiting.  UA shows glucose over 500 and ketones, likely in setting of dehydration.  Glucose on arrival was 258.  CMP shows hypokalemia of 3.0, repleted with IV potassium.  Also shows elevated anion gap of 17.  Patient does not appear to be in DKA. Kidney function is normal. Elevated gap is possibly from emesis.  Patient given IV Zofran for nausea and rehydrated with 2 L of IV fluid.  She p.o. challenge with water and denied any nausea or vomiting.  I attempted to give her morning blood pressure medications including lisinopril, atenolol, Norvasc because she had vomited immediately after taking them this morning.  Taking the medications patient ambulated to the restroom, when she returned she had another episode of emesis.  She thinks it was because she walked quickly to the bathroom.  Given patient's continued emesis will CT A/P and give IV Phenergan for nausea and will reassess.  Patient tolerating p.o. intake after Phenergan.  Her blood pressure also improved by discharge. CT abdomen viewed by me is without any acute abnormalities.  Patient's blood sugar has trended down with IV fluids.  Patient's abdomen is  benign on serial  exams.  Discussed including potassium rich foods in her diet.  Advised patient to take her home medications tonight including hypertensive meds and insulin. Patient is hemodynamically stable, in NAD, and able to ambulate in the ED. Evaluation does not show pathology that would require ongoing emergent intervention or inpatient treatment. I explained the diagnosis to the patient.  Patient is comfortable with above plan and is stable for discharge at this time. All questions were answered prior to disposition. Strict return precautions for returning to the ED were discussed. Encouraged follow up with PCP. Pt case discussed with Dr. Venora Maples who agrees with my plan.   Vitals:   10/20/18 1845 10/20/18 1846 10/20/18 1847 10/20/18 1848  BP: (!) 156/91     Pulse: (!) 101   96  Resp: 18 14 14    Temp:      TempSrc:      SpO2: 97%     Weight:         This note was prepared with assistance of Systems analyst. Occasional wrong-word or sound-a-like substitutions may have occurred due to the inherent limitations of voice recognition software.     Flint Melter 10/20/18 1914    Jola Schmidt, MD 10/21/18 2125

## 2018-10-20 NOTE — ED Notes (Signed)
Patient transported to CT 

## 2018-10-20 NOTE — ED Notes (Signed)
Patient verbalizes understanding of discharge instructions. Opportunity for questioning and answers were provided. Armband removed by staff, pt discharged from ED. Pt ambulatory at discharge, wheeled to lobby for comfort.

## 2018-10-20 NOTE — ED Notes (Signed)
Pt went to bathroom, upon return pt had episode of vomiting. Pt had episode of emesis upon return to room.

## 2018-10-20 NOTE — ED Triage Notes (Signed)
Pt here from home with c/o n/v with possible dehydration , pt has history of same , pt unable meds down this morning

## 2018-10-20 NOTE — ED Notes (Signed)
Pt awaiting ride from husband. Pt to be wheeled out upon husbands arrival.

## 2019-06-30 ENCOUNTER — Ambulatory Visit: Payer: BC Managed Care – PPO | Attending: Internal Medicine

## 2019-07-30 ENCOUNTER — Other Ambulatory Visit: Payer: Self-pay

## 2019-07-30 ENCOUNTER — Emergency Department (HOSPITAL_COMMUNITY)
Admission: EM | Admit: 2019-07-30 | Discharge: 2019-07-30 | Disposition: A | Discharge status: Home / Self Care | Payer: BC Managed Care – PPO | Attending: Emergency Medicine | Admitting: Emergency Medicine

## 2019-07-30 DIAGNOSIS — E119 Type 2 diabetes mellitus without complications: Secondary | ICD-10-CM | POA: Diagnosis not present

## 2019-07-30 DIAGNOSIS — Z79899 Other long term (current) drug therapy: Secondary | ICD-10-CM | POA: Diagnosis not present

## 2019-07-30 DIAGNOSIS — Z87891 Personal history of nicotine dependence: Secondary | ICD-10-CM | POA: Insufficient documentation

## 2019-07-30 DIAGNOSIS — I10 Essential (primary) hypertension: Secondary | ICD-10-CM | POA: Diagnosis not present

## 2019-07-30 DIAGNOSIS — Z7982 Long term (current) use of aspirin: Secondary | ICD-10-CM | POA: Insufficient documentation

## 2019-07-30 DIAGNOSIS — R197 Diarrhea, unspecified: Secondary | ICD-10-CM | POA: Diagnosis not present

## 2019-07-30 DIAGNOSIS — Z7984 Long term (current) use of oral hypoglycemic drugs: Secondary | ICD-10-CM | POA: Insufficient documentation

## 2019-07-30 DIAGNOSIS — R112 Nausea with vomiting, unspecified: Secondary | ICD-10-CM | POA: Diagnosis not present

## 2019-07-30 DIAGNOSIS — E039 Hypothyroidism, unspecified: Secondary | ICD-10-CM | POA: Insufficient documentation

## 2019-07-30 LAB — URINALYSIS, ROUTINE W REFLEX MICROSCOPIC
Bacteria, UA: NONE SEEN
Bilirubin Urine: NEGATIVE
Glucose, UA: >=500 mg/dL — AB
Hgb urine dipstick: NEGATIVE
Ketones, ur: 20 mg/dL — AB
Leukocytes,Ua: NEGATIVE
Nitrite: NEGATIVE
Protein, ur: 100 mg/dL — AB
Specific Gravity, Urine: 1.017 (ref 1.005–1.030)
pH: 7 (ref 5.0–8.0)

## 2019-07-30 LAB — COMPREHENSIVE METABOLIC PANEL
ALT: 20 U/L (ref 0–44)
AST: 19 U/L (ref 15–41)
Albumin: 4.5 g/dL (ref 3.5–5.0)
Alkaline Phosphatase: 121 U/L (ref 38–126)
Anion gap: 15 (ref 5–15)
BUN: 9 mg/dL (ref 8–23)
CO2: 27 mmol/L (ref 22–32)
Calcium: 10.3 mg/dL (ref 8.9–10.3)
Chloride: 97 mmol/L — ABNORMAL LOW (ref 98–111)
Creatinine, Ser: 0.85 mg/dL (ref 0.44–1.00)
GFR calc Af Amer: >60 mL/min (ref >60)
GFR calc non Af Amer: >60 mL/min (ref >60)
Glucose, Bld: 257 mg/dL — ABNORMAL HIGH (ref 70–99)
Potassium: 4.1 mmol/L (ref 3.5–5.1)
Sodium: 139 mmol/L (ref 135–145)
Total Bilirubin: 0.5 mg/dL (ref 0.3–1.2)
Total Protein: 8.8 g/dL — ABNORMAL HIGH (ref 6.5–8.1)

## 2019-07-30 LAB — CBC
HCT: 45.5 % (ref 36.0–46.0)
Hemoglobin: 14.6 g/dL (ref 12.0–15.0)
MCH: 28 pg (ref 26.0–34.0)
MCHC: 32.1 g/dL (ref 30.0–36.0)
MCV: 87.3 fL (ref 80.0–100.0)
Platelets: 310 10*3/uL (ref 150–400)
RBC: 5.21 MIL/uL — ABNORMAL HIGH (ref 3.87–5.11)
RDW: 14.9 % (ref 11.5–15.5)
WBC: 10.7 10*3/uL — ABNORMAL HIGH (ref 4.0–10.5)
nRBC: 0 % (ref 0.0–0.2)

## 2019-07-30 LAB — LIPASE, BLOOD: Lipase: 18 U/L (ref 11–51)

## 2019-07-30 MED ORDER — SODIUM CHLORIDE 0.9% FLUSH: 3.0000 mL | Freq: Once | INTRAVENOUS | Status: DC

## 2019-07-30 MED ORDER — BACLOFEN 5 MG PO TABS: 5.0000 mg | ORAL_TABLET | Freq: Three times a day (TID) | ORAL | 0 refills | Status: AC

## 2019-07-30 MED ORDER — PROMETHAZINE HCL 25 MG/ML IJ SOLN
12.5000 mg | Freq: Four times a day (QID) | INTRAMUSCULAR | Status: DC | PRN
  Administered 2019-07-30: 12.5 mg via INTRAVENOUS
  Filled 2019-07-30: qty 1

## 2019-07-30 MED ORDER — SODIUM CHLORIDE 0.9 % IV BOLUS
1000.0000 mL | Freq: Once | INTRAVENOUS | Status: AC
  Administered 2019-07-30: 18:00:00 1000 mL via INTRAVENOUS

## 2019-07-30 MED ORDER — AMLODIPINE BESYLATE 5 MG PO TABS
10.0000 mg | ORAL_TABLET | Freq: Once | ORAL | Status: AC
  Administered 2019-07-30: 21:00:00 10 mg via ORAL
  Filled 2019-07-30: qty 2

## 2019-07-30 MED ORDER — ONDANSETRON HCL 4 MG/2ML IJ SOLN
4.0000 mg | Freq: Once | INTRAMUSCULAR | Status: AC
  Administered 2019-07-30: 18:00:00 4 mg via INTRAVENOUS
  Filled 2019-07-30: qty 2

## 2019-07-30 NOTE — ED Triage Notes (Signed)
Onset this morning vomiting x 6, loose stools, and feeling weak.  Pt states "I think I'm dehydrated, I have a tendency to get dehydrated.  My doctor changed my medication 1 week ago and I have been urinating a lot".   No dizziness, chest pain, shortness of breath.

## 2019-07-30 NOTE — ED Notes (Signed)
Pt ambulated to restroom without assistance.

## 2019-07-30 NOTE — Discharge Instructions (Addendum)
Your laboratory results were within normal limits today.  I have prescribed a short prescription of baclofen to help with your hiccups, please take 1 tablet up to 3 times a day in order to help with symptoms.  Please be aware this medication can make you drowsy.  Please follow-up with your primary care physician as needed.

## 2019-07-30 NOTE — ED Notes (Signed)
Pt resting in bed. Pt denies new or worsening complaints. Will continue to monitor. No distress noted. Pt on continuous monitoring via blood pressure, pulse ox, and cardiac monitor. Medications given and charted per MAR. Tolerated well.  

## 2019-07-30 NOTE — ED Notes (Signed)
Pt given urine cup to collect urine, pt states "will collect urine when pt is able to urinate".

## 2019-07-30 NOTE — ED Provider Notes (Signed)
[place] EMERGENCY DEPARTMENT Provider Note   CSN: [id number] Arrival date & time: 07/30/19  1325     History Chief Complaint  Patient presents with  . Emesis    Sara [name] is a 64 y.o. female.  64 y.o female with a PMH of HTN, DM presents to the ED with a chief complaint of nausea, vomiting and loose stools x this morning. Patient reports her symptoms began this morning upon waking up, she reports nausea, urinary frequency along with loose stools.  She also endorses several episodes of nonbilious, nonbloody emesis x8.  She reports similar complaints in the past, does have previous hospital visits for the same complaints.  Had an EGD done in the past which was normal.  She reports the only changes are her change in blood pressure medication, about a week ago her PCP Dr. Delfina Redwood switch her from amlodipine to amlodipine B.  Also endorses marijuana use, reports smoking 1 blunt yesterday.  She denies any fevers, blood in her stool, abdominal pain, chest pain, shortness of breath.  The history is provided by the patient and medical records.       Past Medical History:  Diagnosis Date  . High cholesterol   . Hypertension   . Hypothyroidism   . Nausea and vomiting since 08/07/2016  . Type II diabetes mellitus [facility])     Patient Active Problem List   Diagnosis Date Noted  . Hypovolemic shock ([place]) 01/18/2018  . Nausea and vomiting 01/18/2018  . Diarrhea 01/18/2018  . Hyponatremia 01/18/2018  . Dyslipidemia associated with type 2 diabetes mellitus ([place]) 08/19/2016  . Acute kidney failure ([place]) 08/18/2016  . Hypokalemia 08/08/2016  . Hypothyroidism 07/04/2011  . HTN (hypertension) 07/04/2011    Past Surgical History:  Procedure Laterality Date  . COLONOSCOPY  08/07/2016  . DILATION AND CURETTAGE OF UTERUS    . ESOPHAGOGASTRODUODENOSCOPY  07/06/2011   Procedure: ESOPHAGOGASTRODUODENOSCOPY (EGD);  Surgeon: Beryle Beams, MD;  Location: [facility] ENDOSCOPY;   Service: Endoscopy;  Laterality: N/A;  . OOPHORECTOMY Right 1990s   "& cyst w/it"  . TOTAL THYROIDECTOMY  2006     OB History   No obstetric history on file.     Family History  Problem Relation Age of Onset  . Diabetes Mellitus II Mother     Social History   Tobacco Use  . Smoking status: Former Smoker    Packs/day: 0.50    Years: 12.00    Pack years: 6.00    Types: Cigarettes    Quit date: 1991    Years since quitting: 30.1  . Smokeless tobacco: Never Used  Substance Use Topics  . Alcohol use: Yes    Alcohol/week: 1.0 standard drinks    Types: 1 Cans of beer per week  . Drug use: Yes    Types: Marijuana    Home Medications Prior to Admission medications   Medication Sig Start Date End Date Taking? Authorizing Provider  acetaminophen (TYLENOL) 500 MG tablet Take 1,000 mg by mouth every 6 (six) hours as needed for headache (pain).    [provider]  amLODipine (NORVASC) 10 MG tablet Take 1 tablet (10 mg total) by mouth daily. Hold, discuss BP management with PCP 01/20/18   Cristal Ford, DO  aspirin EC 81 MG tablet Take 81 mg by mouth daily.    [provider]  Astaxanthin 5 MG CAPS Take 5 mg by mouth daily.    [provider]  atenolol (TENORMIN) 100 MG  tablet Take 1 tablet (100 mg total) by mouth daily. Hold, discuss BP management with PCP 01/20/18   Cristal Ford, DO  baclofen 5 MG TABS Take 5 mg by mouth 3 (three) times daily for 5 days. 07/30/19 08/04/19  Janeece Fitting, PA-C  Calcium-Magnesium-Vitamin D (CITRACAL SLOW RELEASE PO) Take 1 tablet by mouth daily.    [provider]  Cholecalciferol (VITAMIN D3) 2000 UNITS TABS Take 2,000 Units by mouth daily.     [provider]  furosemide (LASIX) 20 MG tablet Take 1 tablet (20 mg total) by mouth daily as needed. Patient not taking: Reported on 10/20/2018 08/20/16   Theodis Blaze, MD  latanoprost (XALATAN) 0.005 % ophthalmic solution Place 1 drop into both eyes at bedtime.  01/16/18   [provider]  levothyroxine (SYNTHROID, LEVOTHROID) 175 MCG tablet Take 175 mcg by mouth daily before breakfast.    [provider]  lisinopril (PRINIVIL,ZESTRIL) 20 MG tablet Take 1 tablet (20 mg total) by mouth daily. Hold, discuss BP management with PCP 01/20/18   Cristal Ford, DO  promethazine (PHENERGAN) 25 MG suppository Place 1 suppository (25 mg total) rectally every 8 (eight) hours as needed for nausea or vomiting. 10/20/18   Albrizze, Harley Hallmark, PA-C  promethazine (PHENERGAN) 25 MG tablet Take 1 tablet (25 mg total) by mouth every 6 (six) hours as needed for nausea or vomiting. 10/20/18   Albrizze, Verline Lema E, PA-C  simvastatin (ZOCOR) 20 MG tablet Take 20 mg by mouth at bedtime.     [provider]  sitaGLIPtin (JANUVIA) 100 MG tablet Take 100 mg by mouth daily.    [provider]    Allergies    Dyazide [hydrochlorothiazide w-triamterene]  Review of Systems   Review of Systems  Constitutional: Negative for fever.  HENT: Negative for sinus pain and sore throat.   Respiratory: Negative for shortness of breath.   Cardiovascular: Negative for chest pain.  Gastrointestinal: Positive for diarrhea, nausea and vomiting. Negative for abdominal pain and blood in stool.  Genitourinary: Positive for frequency. Negative for dysuria, flank pain, vaginal bleeding and vaginal discharge.  Musculoskeletal: Negative for back pain.  Skin: Negative for pallor and wound.  Neurological: Negative for light-headedness and headaches.    Physical Exam Updated Vital Signs BP (!) 185/91   Pulse 87   Temp 98.2 F (36.8 C) (Oral)   Resp 20   SpO2 98%   Physical Exam Vitals and nursing note reviewed.  Constitutional:      Appearance: Normal appearance. She is obese.  HENT:     Head: Normocephalic and atraumatic.     Mouth/Throat:     Mouth: Mucous membranes are dry.  Eyes:     Pupils: Pupils are equal, round, and reactive to light.    Cardiovascular:     Rate and Rhythm: Normal rate.     Pulses:          Popliteal pulses are 2+ on the right side and 2+ on the left side.     Comments: No bilateral pitting edema.  Pulses are present and symmetric. Pulmonary:     Effort: Pulmonary effort is normal.     Breath sounds: No wheezing, rhonchi or rales.     Comments: Lungs are clear to auscultation without any wheezing, rhonchi, rales. Abdominal:     General: Abdomen is flat.     Palpations: Abdomen is soft.     Tenderness: There is no abdominal tenderness. There is no right CVA tenderness, left  CVA tenderness or guarding. Negative signs include McBurney's sign.     Comments: Abdomen is soft, nontender to palpation.  No guarding.  Musculoskeletal:     Cervical back: Normal range of motion and neck supple.     Right lower leg: No edema.     Left lower leg: No edema.  Skin:    General: Skin is warm and dry.     Findings: No erythema or rash.  Neurological:     Mental Status: She is alert and oriented to person, place, and time.     ED Results / Procedures / Treatments   Labs (all labs ordered are listed, but only abnormal results are displayed) Labs Reviewed  COMPREHENSIVE METABOLIC PANEL - Abnormal; Notable for the following components:      Result Value   Chloride 97 (*)    Glucose, Bld 257 (*)    Total Protein 8.8 (*)    All other components within normal limits  CBC - Abnormal; Notable for the following components:   WBC 10.7 (*)    RBC 5.21 (*)    All other components within normal limits  URINALYSIS, ROUTINE W REFLEX MICROSCOPIC - Abnormal; Notable for the following components:   Color, Urine STRAW (*)    Glucose, UA >=500 (*)    Ketones, ur 20 (*)    Protein, ur 100 (*)    All other components within normal limits  LIPASE, BLOOD    EKG EKG Interpretation  Date/Time:  Wednesday July 30 2019 16:54:55 EST Ventricular Rate:  87 PR Interval:    QRS Duration: 114 QT Interval:  432 QTC  Calculation: 520 R Axis:   54 Text Interpretation: Sinus arrhythmia Probable left atrial enlargement Incomplete right bundle branch block Prolonged QT interval Baseline wander in lead(s) V4 sinus arrhytnma new from previous, no ischemic changes Confirmed by Theotis Burrow [phone]) on 07/30/2019 4:58:06 PM   Radiology No results found.  Procedures Procedures (including critical care time)  Medications Ordered in ED Medications  sodium chloride flush (NS) 0.9 % injection 3 mL (has no administration in time range)  promethazine (PHENERGAN) injection 12.5 mg (12.5 mg Intravenous Given 07/30/19 2045)  sodium chloride 0.9 % bolus 1,000 mL (0 mLs Intravenous Stopped 07/30/19 2047)  ondansetron (ZOFRAN) injection 4 mg (4 mg Intravenous Given 07/30/19 1752)  amLODipine (NORVASC) tablet 10 mg (10 mg Oral Given 07/30/19 2045)    ED Course  I have reviewed the triage vital signs and the nursing notes.  Pertinent labs & imaging results that were available during my care of the patient were reviewed by me and considered in my medical decision making (see chart for details).    MDM Rules/Calculators/A&P   Patient with a past medical history of nausea and vomiting, seen in the ED for several times for the same episode presents with nausea, vomiting, loose stools since this morning around 5 AM upon waking up.  Patient states "these are signs of dehydration for me ".  Patient has had a work-up in the past with an EGD which she reports was normal.  He does endorse some marijuana use, reports she smoked 1 blunt yesterday while at home.  She denies any fevers, recent travel, blood in her stool or emesis.  During my evaluation patient is overall well-appearing, nontoxic, vitals are within normal limits.  She did take her blood pressure education today but did have an episode of emesis right after taking this.  Blood pressure was 178/82.  She is afebrile.  Denies any suspicious food intakes.  Lungs are clear to  auscultation, abdomen is soft, nontender to palpation.No peritoneal signs. No bilateral leg pitting edema.  No Active vomiting during my encounter.  CMP with some elevated glucose, creatinine level is within normal limits.  LFTs normal.  No anion gap, doubt DKA.  CBC with some slight elevation of white count, suspect this is likely coming from her emesis episodes.  Lipase level is within normal limits.  UA without any nitrites, leukocytes, white blood cell count.  Ketones are present in her urine.  Patient is provided with 1 L bolus denies any prior history of heart failure along with Zofran.  According to patient's records, she also had a colonoscopy performed in 2012 by Dr. Thana Farr.  She does have a CT abdominal pelvis from 2020 on file which did not show any acute abnormalities done. EKG reviewed without any QTC within normal limits, given Phenergan 12.5.  She was also given her blood pressure medication while in the ED.  Unfortunately patient was reassessed, had another episode of emesis.  Provided with ginger ale as she reports she felt like this was residual from her previous.  10:47 PM patient was reassessed by me, abdomen remains benign without any peritoneal signs.  No further episodes of vomiting.  She is requesting a prescription for baclofen as she has developed hiccups while her ED stay, she has been prescribed this in the past to help with her hiccups. Patient stable for discharge. Return precautions dicussed at length.   Portions of this note were generated with [employer]. Dictation errors may occur despite best attempts at proofreading.  Final Clinical Impression(s) / ED Diagnoses Final diagnoses:  Nausea vomiting and diarrhea    Rx / DC Orders ED Discharge Orders         Ordered    baclofen 5 MG TABS  3 times daily     07/30/19 2246           Janeece Fitting, PA-C 07/30/19 2248    Little, Wenda Overland, MD 07/31/19 1504

## 2019-11-05 ENCOUNTER — Emergency Department (HOSPITAL_COMMUNITY)
Admission: EM | Admit: 2019-11-05 | Discharge: 2019-11-06 | Disposition: A | Discharge status: Home / Self Care | Payer: BC Managed Care – PPO | Attending: Emergency Medicine | Admitting: Emergency Medicine

## 2019-11-05 ENCOUNTER — Encounter (HOSPITAL_COMMUNITY): Payer: Self-pay

## 2019-11-05 DIAGNOSIS — R112 Nausea with vomiting, unspecified: Secondary | ICD-10-CM

## 2019-11-05 DIAGNOSIS — E119 Type 2 diabetes mellitus without complications: Secondary | ICD-10-CM | POA: Diagnosis not present

## 2019-11-05 DIAGNOSIS — Z87891 Personal history of nicotine dependence: Secondary | ICD-10-CM | POA: Diagnosis not present

## 2019-11-05 DIAGNOSIS — Z7984 Long term (current) use of oral hypoglycemic drugs: Secondary | ICD-10-CM | POA: Insufficient documentation

## 2019-11-05 DIAGNOSIS — E039 Hypothyroidism, unspecified: Secondary | ICD-10-CM | POA: Insufficient documentation

## 2019-11-05 DIAGNOSIS — Z7982 Long term (current) use of aspirin: Secondary | ICD-10-CM | POA: Insufficient documentation

## 2019-11-05 DIAGNOSIS — Z79899 Other long term (current) drug therapy: Secondary | ICD-10-CM | POA: Insufficient documentation

## 2019-11-05 DIAGNOSIS — I10 Essential (primary) hypertension: Secondary | ICD-10-CM | POA: Diagnosis not present

## 2019-11-05 LAB — COMPREHENSIVE METABOLIC PANEL
ALT: 17 U/L (ref 0–44)
AST: 21 U/L (ref 15–41)
Albumin: 4.4 g/dL (ref 3.5–5.0)
Alkaline Phosphatase: 104 U/L (ref 38–126)
Anion gap: 17 — ABNORMAL HIGH (ref 5–15)
BUN: 9 mg/dL (ref 8–23)
CO2: 23 mmol/L (ref 22–32)
Calcium: 9.9 mg/dL (ref 8.9–10.3)
Chloride: 95 mmol/L — ABNORMAL LOW (ref 98–111)
Creatinine, Ser: 0.75 mg/dL (ref 0.44–1.00)
GFR calc Af Amer: >60 mL/min (ref >60)
GFR calc non Af Amer: >60 mL/min (ref >60)
Glucose, Bld: 183 mg/dL — ABNORMAL HIGH (ref 70–99)
Potassium: 3.5 mmol/L (ref 3.5–5.1)
Sodium: 135 mmol/L (ref 135–145)
Total Bilirubin: 0.1 mg/dL — ABNORMAL LOW (ref 0.3–1.2)
Total Protein: 8.7 g/dL — ABNORMAL HIGH (ref 6.5–8.1)

## 2019-11-05 LAB — URINALYSIS, ROUTINE W REFLEX MICROSCOPIC
Bacteria, UA: NONE SEEN
Bilirubin Urine: NEGATIVE
Glucose, UA: >=500 mg/dL — AB
Hgb urine dipstick: NEGATIVE
Ketones, ur: 80 mg/dL — AB
Leukocytes,Ua: NEGATIVE
Nitrite: NEGATIVE
Protein, ur: >=300 mg/dL — AB
Specific Gravity, Urine: 1.016 (ref 1.005–1.030)
pH: 6 (ref 5.0–8.0)

## 2019-11-05 LAB — CBC
HCT: 44.3 % (ref 36.0–46.0)
Hemoglobin: 14.3 g/dL (ref 12.0–15.0)
MCH: 27.3 pg (ref 26.0–34.0)
MCHC: 32.3 g/dL (ref 30.0–36.0)
MCV: 84.7 fL (ref 80.0–100.0)
Platelets: 360 10*3/uL (ref 150–400)
RBC: 5.23 MIL/uL — ABNORMAL HIGH (ref 3.87–5.11)
RDW: 14.3 % (ref 11.5–15.5)
WBC: 11.4 10*3/uL — ABNORMAL HIGH (ref 4.0–10.5)
nRBC: 0 % (ref 0.0–0.2)

## 2019-11-05 LAB — LIPASE, BLOOD: Lipase: 17 U/L (ref 11–51)

## 2019-11-05 MED ORDER — SODIUM CHLORIDE 0.9% FLUSH
3.0000 mL | Freq: Once | INTRAVENOUS | Status: AC
  Administered 2019-11-06: 3 mL via INTRAVENOUS

## 2019-11-05 NOTE — ED Triage Notes (Signed)
Pt reports that she has been vomiting all day and feels like she is dehydrated.

## 2019-11-06 LAB — CBG MONITORING, ED: Glucose-Capillary: 173 mg/dL — ABNORMAL HIGH (ref 70–99)

## 2019-11-06 MED ORDER — LACTATED RINGERS IV BOLUS
1000.0000 mL | Freq: Once | INTRAVENOUS | Status: AC
  Administered 2019-11-06: 1000 mL via INTRAVENOUS

## 2019-11-06 MED ORDER — FAMOTIDINE IN NACL 20-0.9 MG/50ML-% IV SOLN
20.0000 mg | Freq: Once | INTRAVENOUS | Status: AC
  Administered 2019-11-06: 20 mg via INTRAVENOUS
  Filled 2019-11-06: qty 50

## 2019-11-06 MED ORDER — METOCLOPRAMIDE HCL 5 MG/ML IJ SOLN
10.0000 mg | Freq: Once | INTRAMUSCULAR | Status: AC
  Administered 2019-11-06: 10 mg via INTRAVENOUS
  Filled 2019-11-06: qty 2

## 2019-11-06 MED ORDER — DIPHENHYDRAMINE HCL 50 MG/ML IJ SOLN
25.0000 mg | Freq: Once | INTRAMUSCULAR | Status: AC
  Administered 2019-11-06: 25 mg via INTRAVENOUS
  Filled 2019-11-06: qty 1

## 2019-11-06 MED ORDER — METOCLOPRAMIDE HCL 10 MG PO TABS: 10.0000 mg | ORAL_TABLET | Freq: Four times a day (QID) | ORAL | 0 refills | Status: DC | PRN

## 2019-11-06 MED ORDER — ALUM & MAG HYDROXIDE-SIMETH 200-200-20 MG/5ML PO SUSP
30.0000 mL | Freq: Once | ORAL | Status: AC
  Administered 2019-11-06: 30 mL via ORAL
  Filled 2019-11-06: qty 30

## 2019-11-06 NOTE — ED Provider Notes (Signed)
[place] EMERGENCY DEPARTMENT Provider Note   CSN: [id number] Arrival date & time: 11/05/19  1925     History Chief Complaint  Patient presents with  . Emesis    Sara [name] is a 64 y.o. female.  64 yo F with a chief complaints of nausea and vomiting.  This started today and has been persistent throughout the day.  Denies abdominal pain denies fevers not been able to keep anything down.  Has had this happen recurrently.  She thinks it has in the do with either her diabetes or her stomach.  Was on Carafate to help counter prevent this in the past.  No diarrhea.  No suspicious food intake.  The history is provided by the patient.  Emesis Severity:  Moderate Duration:  2 days Timing:  Constant Quality:  Stomach contents Progression:  Unchanged Chronicity:  New Recent urination:  Normal Relieved by:  Nothing Worsened by:  Nothing Ineffective treatments:  None tried Associated symptoms: no abdominal pain, no arthralgias, no chills, no fever, no headaches and no myalgias        Past Medical History:  Diagnosis Date  . High cholesterol   . Hypertension   . Hypothyroidism   . Nausea and vomiting since 08/07/2016  . Type II diabetes mellitus [facility])     Patient Active Problem List   Diagnosis Date Noted  . Hypovolemic shock ([place]) 01/18/2018  . Nausea and vomiting 01/18/2018  . Diarrhea 01/18/2018  . Hyponatremia 01/18/2018  . Dyslipidemia associated with type 2 diabetes mellitus ([place]) 08/19/2016  . Acute kidney failure ([place]) 08/18/2016  . Hypokalemia 08/08/2016  . Hypothyroidism 07/04/2011  . HTN (hypertension) 07/04/2011    Past Surgical History:  Procedure Laterality Date  . COLONOSCOPY  08/07/2016  . DILATION AND CURETTAGE OF UTERUS    . ESOPHAGOGASTRODUODENOSCOPY  07/06/2011   Procedure: ESOPHAGOGASTRODUODENOSCOPY (EGD);  Surgeon: Beryle Beams, MD;  Location[facility]COPY;  Service: Endoscopy;  Laterality: N/A;  . OOPHORECTOMY Right  1990s   "& cyst w/it"  . TOTAL THYROIDECTOMY  2006     OB History   No obstetric history on file.     Family History  Problem Relation Age of Onset  . Diabetes Mellitus II Mother     Social History   Tobacco Use  . Smoking status: Former Smoker    Packs/day: 0.50    Years: 12.00    Pack years: 6.00    Types: Cigarettes    Quit date: 1991    Years since quitting: 30.3  . Smokeless tobacco: Never Used  Substance Use Topics  . Alcohol use: Yes    Alcohol/week: 1.0 standard drinks    Types: 1 Cans of beer per week  . Drug use: Yes    Types: Marijuana    Home Medications Prior to Admission medications   Medication Sig Start Date End Date Taking? Authorizing Provider  acetaminophen (TYLENOL) 500 MG tablet Take 1,000 mg by mouth every 6 (six) hours as needed for headache (pain).   Yes [provider]  amLODipine (NORVASC) 10 MG tablet Take 1 tablet (10 mg total) by mouth daily. Hold, discuss BP management with PCP Patient taking differently: Take 10 mg by mouth daily.  01/20/18  Yes Mikhail, Velta Addison, DO  aspirin EC 81 MG tablet Take 81 mg by mouth daily.   Yes [provider]  atenolol (TENORMIN) 100 MG tablet Take 1 tablet (100 mg total) by mouth daily. Hold, discuss BP management with PCP  01/20/18  Yes Mikhail, [place], DO  Calcium-Magnesium-Vitamin D (CITRACAL SLOW RELEASE PO) Take 1 tablet by mouth daily.   Yes [provider]  Cholecalciferol (VITAMIN D3) 2000 UNITS TABS Take 2,000 Units by mouth daily.    Yes [provider]  latanoprost (XALATAN) 0.005 % ophthalmic solution Place 1 drop into both eyes at bedtime. 01/16/18  Yes [provider]  levothyroxine (SYNTHROID) 150 MCG tablet Take 150 mcg by mouth daily. 11/04/19  Yes [provider]  lisinopril-hydrochlorothiazide (ZESTORETIC) 10-12.5 MG tablet Take 1 tablet by mouth daily. 10/18/19  Yes [provider]  pantoprazole (PROTONIX) 40 MG tablet Take 40 mg by  mouth daily. 10/27/19  Yes [provider]  simvastatin (ZOCOR) 20 MG tablet Take 20 mg by mouth at bedtime.    Yes [provider]  sitaGLIPtin (JANUVIA) 100 MG tablet Take 100 mg by mouth daily.   Yes [provider]  furosemide (LASIX) 20 MG tablet Take 1 tablet (20 mg total) by mouth daily as needed. Patient not taking: Reported on 10/20/2018 08/20/16   Theodis Blaze, MD  lisinopril (PRINIVIL,ZESTRIL) 20 MG tablet Take 1 tablet (20 mg total) by mouth daily. Hold, discuss BP management with PCP Patient not taking: Reported on 11/06/2019 01/20/18   Cristal Ford, DO  metoCLOPramide (REGLAN) 10 MG tablet Take 1 tablet (10 mg total) by mouth every 6 (six) hours as needed for nausea (nausea/headache). 11/06/19   Deno Etienne, DO  promethazine (PHENERGAN) 25 MG suppository Place 1 suppository (25 mg total) rectally every 8 (eight) hours as needed for nausea or vomiting. Patient not taking: Reported on 11/06/2019 10/20/18   Albrizze, Harley Hallmark, PA-C  promethazine (PHENERGAN) 25 MG tablet Take 1 tablet (25 mg total) by mouth every 6 (six) hours as needed for nausea or vomiting. Patient not taking: Reported on 11/06/2019 10/20/18   Albrizze, Harley Hallmark, PA-C    Allergies    Dyazide [hydrochlorothiazide w-triamterene]  Review of Systems   Review of Systems  Constitutional: Negative for chills and fever.  HENT: Negative for congestion and rhinorrhea.   Eyes: Negative for redness and visual disturbance.  Respiratory: Negative for shortness of breath and wheezing.   Cardiovascular: Negative for chest pain and palpitations.  Gastrointestinal: Positive for nausea and vomiting. Negative for abdominal pain.  Genitourinary: Negative for dysuria and urgency.  Musculoskeletal: Negative for arthralgias and myalgias.  Skin: Negative for pallor and wound.  Neurological: Negative for dizziness and headaches.    Physical Exam Updated Vital Signs BP (!) 171/93 (BP Location: Left Arm)    Pulse 84   Temp 98.4 F (36.9 C) (Oral)   Resp 17   SpO2 98%   Physical Exam Vitals and nursing note reviewed.  Constitutional:      General: She is not in acute distress.    Appearance: She is well-developed. She is obese. She is not diaphoretic.  HENT:     Head: Normocephalic and atraumatic.  Eyes:     Pupils: Pupils are equal, round, and reactive to light.  Cardiovascular:     Rate and Rhythm: Normal rate and regular rhythm.     Heart sounds: No murmur. No friction rub. No gallop.   Pulmonary:     Effort: Pulmonary effort is normal.     Breath sounds: No wheezing or rales.  Abdominal:     General: There is no distension.     Palpations: Abdomen is soft.     Tenderness: There is no abdominal tenderness. There  is no guarding.  Musculoskeletal:        General: No tenderness.     Cervical back: Normal range of motion and neck supple.  Skin:    General: Skin is warm and dry.  Neurological:     Mental Status: She is alert and oriented to person, place, and time.  Psychiatric:        Behavior: Behavior normal.     ED Results / Procedures / Treatments   Labs (all labs ordered are listed, but only abnormal results are displayed) Labs Reviewed  COMPREHENSIVE METABOLIC PANEL - Abnormal; Notable for the following components:      Result Value   Chloride 95 (*)    Glucose, Bld 183 (*)    Total Protein 8.7 (*)    Total Bilirubin 0.1 (*)    Anion gap 17 (*)    All other components within normal limits  CBC - Abnormal; Notable for the following components:   WBC 11.4 (*)    RBC 5.23 (*)    All other components within normal limits  URINALYSIS, ROUTINE W REFLEX MICROSCOPIC - Abnormal; Notable for the following components:   Glucose, UA >=500 (*)    Ketones, ur 80 (*)    Protein, ur >=300 (*)    All other components within normal limits  CBG MONITORING, ED - Abnormal; Notable for the following components:   Glucose-Capillary 173 (*)    All other components within normal  limits  LIPASE, BLOOD    EKG None  Radiology No results found.  Procedures Procedures (including critical care time)  Medications Ordered in ED Medications  sodium chloride flush (NS) 0.9 % injection 3 mL (3 mLs Intravenous Given 11/06/19 0053)  lactated ringers bolus 1,000 mL (0 mLs Intravenous Stopped 11/06/19 0217)  metoCLOPramide (REGLAN) injection 10 mg (10 mg Intravenous Given 11/06/19 0053)  diphenhydrAMINE (BENADRYL) injection 25 mg (25 mg Intravenous Given 11/06/19 0053)  famotidine (PEPCID) IVPB 20 mg premix (0 mg Intravenous Stopped 11/06/19 0132)  alum & mag hydroxide-simeth (MAALOX/MYLANTA) 200-200-20 MG/5ML suspension 30 mL (30 mLs Oral Given 11/06/19 0221)    ED Course  I have reviewed the triage vital signs and the nursing notes.  Pertinent labs & imaging results that were available during my care of the patient were reviewed by me and considered in my medical decision making (see chart for details).    MDM Rules/Calculators/A&P                      64 yo F with a chief complaints of nausea and vomiting.  This a recurrent issue for her.  Could be due to gastroparesis that she feels is worse when her blood sugars are elevated.  We will give a bolus of IV fluids.  Reassess.  Patient feeling better on reassessment.  Tolerating p.o.  Labs evaluated, mild elevation of the anion gap likely due to a ketosis from vomiting with ketones in the urine.  Urine does not appear to be infected mild leukocytosis.  Discharge patient home.  PCP follow-up.  2:50 AM:  I have discussed the diagnosis/risks/treatment options with the patient and believe the pt to be eligible for discharge home to follow-up with PCP. We also discussed returning to the ED immediately if new or worsening sx occur. We discussed the sx which are most concerning (e.g., sudden worsening pain, fever, inability to tolerate by mouth) that necessitate immediate return. Medications administered to the patient during their  visit and any  new prescriptions provided to the patient are listed below.  Medications given during this visit Medications  sodium chloride flush (NS) 0.9 % injection 3 mL (3 mLs Intravenous Given 11/06/19 0053)  lactated ringers bolus 1,000 mL (0 mLs Intravenous Stopped 11/06/19 0217)  metoCLOPramide (REGLAN) injection 10 mg (10 mg Intravenous Given 11/06/19 0053)  diphenhydrAMINE (BENADRYL) injection 25 mg (25 mg Intravenous Given 11/06/19 0053)  famotidine (PEPCID) IVPB 20 mg premix (0 mg Intravenous Stopped 11/06/19 0132)  alum & mag hydroxide-simeth (MAALOX/MYLANTA) 200-200-20 MG/5ML suspension 30 mL (30 mLs Oral Given 11/06/19 0221)     The patient appears reasonably screen and/or stabilized for discharge and I doubt any other medical condition or other [facility] requiring further screening, evaluation, or treatment in the ED at this time prior to discharge.   Final Clinical Impression(s) / ED Diagnoses Final diagnoses:  Nausea and vomiting in adult    Rx / DC Orders ED Discharge Orders         Ordered    metoCLOPramide (REGLAN) 10 MG tablet  Every 6 hours PRN     11/06/19 [place], [place], DO 11/06/19 0250

## 2019-11-06 NOTE — ED Notes (Signed)
Discharge instructions discussed with pt. Pt verbalized understanding. Pt stable and ambulatory. No signature pad available. 

## 2019-11-06 NOTE — Discharge Instructions (Signed)
Try zantac or pepcid twice a day.  Try to avoid things that may make this worse, most commonly these are spicy foods tomato based products fatty foods chocolate and peppermint.  Alcohol and tobacco can also make this worse.  Return to the emergency department for sudden worsening pain fever or inability to eat or drink. ° °

## 2019-11-06 NOTE — ED Notes (Signed)
Pt tolerating fluids well. 

## 2020-07-11 ENCOUNTER — Encounter (HOSPITAL_COMMUNITY): Payer: Self-pay | Admitting: Emergency Medicine

## 2020-07-11 ENCOUNTER — Other Ambulatory Visit: Payer: Self-pay

## 2020-07-11 ENCOUNTER — Emergency Department (HOSPITAL_COMMUNITY)
Admission: EM | Admit: 2020-07-11 | Discharge: 2020-07-12 | Disposition: A | Discharge status: Home / Self Care | Payer: BC Managed Care – PPO | Attending: Emergency Medicine | Admitting: Emergency Medicine

## 2020-07-11 DIAGNOSIS — Z79899 Other long term (current) drug therapy: Secondary | ICD-10-CM | POA: Diagnosis not present

## 2020-07-11 DIAGNOSIS — R197 Diarrhea, unspecified: Secondary | ICD-10-CM | POA: Diagnosis not present

## 2020-07-11 DIAGNOSIS — E039 Hypothyroidism, unspecified: Secondary | ICD-10-CM | POA: Insufficient documentation

## 2020-07-11 DIAGNOSIS — Z7984 Long term (current) use of oral hypoglycemic drugs: Secondary | ICD-10-CM | POA: Insufficient documentation

## 2020-07-11 DIAGNOSIS — K3184 Gastroparesis: Secondary | ICD-10-CM | POA: Insufficient documentation

## 2020-07-11 DIAGNOSIS — Z87891 Personal history of nicotine dependence: Secondary | ICD-10-CM | POA: Insufficient documentation

## 2020-07-11 DIAGNOSIS — E1143 Type 2 diabetes mellitus with diabetic autonomic (poly)neuropathy: Secondary | ICD-10-CM | POA: Insufficient documentation

## 2020-07-11 DIAGNOSIS — Z7982 Long term (current) use of aspirin: Secondary | ICD-10-CM | POA: Diagnosis not present

## 2020-07-11 DIAGNOSIS — E1165 Type 2 diabetes mellitus with hyperglycemia: Secondary | ICD-10-CM | POA: Insufficient documentation

## 2020-07-11 DIAGNOSIS — R112 Nausea with vomiting, unspecified: Secondary | ICD-10-CM | POA: Diagnosis not present

## 2020-07-11 DIAGNOSIS — I10 Essential (primary) hypertension: Secondary | ICD-10-CM | POA: Diagnosis not present

## 2020-07-11 LAB — COMPREHENSIVE METABOLIC PANEL
ALT: 19 U/L (ref 0–44)
AST: 18 U/L (ref 15–41)
Albumin: 4.6 g/dL (ref 3.5–5.0)
Alkaline Phosphatase: 105 U/L (ref 38–126)
Anion gap: 16 — ABNORMAL HIGH (ref 5–15)
BUN: 12 mg/dL (ref 8–23)
CO2: 23 mmol/L (ref 22–32)
Calcium: 9.8 mg/dL (ref 8.9–10.3)
Chloride: 95 mmol/L — ABNORMAL LOW (ref 98–111)
Creatinine, Ser: 0.8 mg/dL (ref 0.44–1.00)
GFR, Estimated: >60 mL/min (ref >60)
Glucose, Bld: 262 mg/dL — ABNORMAL HIGH (ref 70–99)
Potassium: 3.1 mmol/L — ABNORMAL LOW (ref 3.5–5.1)
Sodium: 134 mmol/L — ABNORMAL LOW (ref 135–145)
Total Bilirubin: 0.7 mg/dL (ref 0.3–1.2)
Total Protein: 8.7 g/dL — ABNORMAL HIGH (ref 6.5–8.1)

## 2020-07-11 LAB — CBC
HCT: 46 % (ref 36.0–46.0)
Hemoglobin: 14.8 g/dL (ref 12.0–15.0)
MCH: 27.5 pg (ref 26.0–34.0)
MCHC: 32.2 g/dL (ref 30.0–36.0)
MCV: 85.5 fL (ref 80.0–100.0)
Platelets: 332 10*3/uL (ref 150–400)
RBC: 5.38 MIL/uL — ABNORMAL HIGH (ref 3.87–5.11)
RDW: 15.4 % (ref 11.5–15.5)
WBC: 12.5 10*3/uL — ABNORMAL HIGH (ref 4.0–10.5)
nRBC: 0 % (ref 0.0–0.2)

## 2020-07-11 LAB — LIPASE, BLOOD: Lipase: 21 U/L (ref 11–51)

## 2020-07-11 LAB — CBG MONITORING, ED: Glucose-Capillary: 257 mg/dL — ABNORMAL HIGH (ref 70–99)

## 2020-07-11 NOTE — ED Triage Notes (Signed)
Pt reports nausea and vomiting since 3am.  Denies pain.  States blood sugar 252 at home.  Takes Januvia.  No insulin.

## 2020-07-12 LAB — BETA-HYDROXYBUTYRIC ACID: Beta-Hydroxybutyric Acid: 0.56 mmol/L — ABNORMAL HIGH (ref 0.05–0.27)

## 2020-07-12 LAB — BASIC METABOLIC PANEL
Anion gap: 14 (ref 5–15)
BUN: 15 mg/dL (ref 8–23)
CO2: 24 mmol/L (ref 22–32)
Calcium: 8.9 mg/dL (ref 8.9–10.3)
Chloride: 99 mmol/L (ref 98–111)
Creatinine, Ser: 0.8 mg/dL (ref 0.44–1.00)
GFR, Estimated: >60 mL/min (ref >60)
Glucose, Bld: 216 mg/dL — ABNORMAL HIGH (ref 70–99)
Potassium: 3.2 mmol/L — ABNORMAL LOW (ref 3.5–5.1)
Sodium: 137 mmol/L (ref 135–145)

## 2020-07-12 LAB — URINALYSIS, ROUTINE W REFLEX MICROSCOPIC
Bacteria, UA: NONE SEEN
Bilirubin Urine: NEGATIVE
Glucose, UA: >=500 mg/dL — AB
Hgb urine dipstick: NEGATIVE
Ketones, ur: 5 mg/dL — AB
Leukocytes,Ua: NEGATIVE
Nitrite: NEGATIVE
Protein, ur: 30 mg/dL — AB
Specific Gravity, Urine: 1.01 (ref 1.005–1.030)
pH: 6 (ref 5.0–8.0)

## 2020-07-12 LAB — CBG MONITORING, ED: Glucose-Capillary: 215 mg/dL — ABNORMAL HIGH (ref 70–99)

## 2020-07-12 MED ORDER — PROCHLORPERAZINE EDISYLATE 10 MG/2ML IJ SOLN
10.0000 mg | Freq: Once | INTRAMUSCULAR | Status: AC
  Administered 2020-07-12: 10 mg via INTRAVENOUS
  Filled 2020-07-12: qty 2

## 2020-07-12 MED ORDER — SODIUM CHLORIDE 0.9 % IV BOLUS (SEPSIS)
1000.0000 mL | Freq: Once | INTRAVENOUS | Status: AC
  Administered 2020-07-12: 1000 mL via INTRAVENOUS

## 2020-07-12 MED ORDER — SODIUM CHLORIDE 0.9 % IV SOLN
1000.0000 mL | INTRAVENOUS | Status: DC
  Administered 2020-07-12: 1000 mL via INTRAVENOUS

## 2020-07-12 MED ORDER — LABETALOL HCL 5 MG/ML IV SOLN
20.0000 mg | Freq: Once | INTRAVENOUS | Status: AC
  Administered 2020-07-12: 20 mg via INTRAVENOUS
  Filled 2020-07-12: qty 4

## 2020-07-12 MED ORDER — POTASSIUM CHLORIDE 10 MEQ/100ML IV SOLN
10.0000 meq | Freq: Once | INTRAVENOUS | Status: AC
  Administered 2020-07-12: 10 meq via INTRAVENOUS
  Filled 2020-07-12: qty 100

## 2020-07-12 MED ORDER — ONDANSETRON HCL 4 MG/2ML IJ SOLN
4.0000 mg | Freq: Once | INTRAMUSCULAR | Status: AC
  Administered 2020-07-12: 4 mg via INTRAVENOUS
  Filled 2020-07-12: qty 2

## 2020-07-12 MED ORDER — AMLODIPINE BESYLATE 5 MG PO TABS
10.0000 mg | ORAL_TABLET | Freq: Once | ORAL | Status: AC
  Administered 2020-07-12: 10 mg via ORAL
  Filled 2020-07-12: qty 2

## 2020-07-12 MED ORDER — PROMETHAZINE HCL 25 MG/ML IJ SOLN
12.5000 mg | Freq: Once | INTRAMUSCULAR | Status: AC
  Administered 2020-07-12: 12.5 mg via INTRAVENOUS
  Filled 2020-07-12: qty 1

## 2020-07-12 MED ORDER — POTASSIUM CHLORIDE CRYS ER 20 MEQ PO TBCR
40.0000 meq | EXTENDED_RELEASE_TABLET | Freq: Once | ORAL | Status: AC
  Administered 2020-07-12: 40 meq via ORAL
  Filled 2020-07-12: qty 2

## 2020-07-12 NOTE — Progress Notes (Signed)
Inpatient Diabetes Program Recommendations  AACE/ADA: New Consensus Statement on Inpatient Glycemic Control (2015)  Target Ranges:  Prepandial:   less than 140 mg/dL      Peak postprandial:   less than 180 mg/dL (1-2 hours)      Critically ill patients:  140 - 180 mg/dL   Lab Results  Component Value Date   GLUCAP 215 (H) 07/12/2020   HGBA1C 7.2 (H) 08/08/2016    Review of Glycemic Control Results for Sara [name], Sara [name] (MRN [id number]) as of 07/12/2020 10:29  Ref. Range 07/11/2020 14:31 07/12/2020 06:12  Glucose-Capillary Latest Ref Range: 70 - 99 mg/dL 257 (H) 215 (H)   Diabetes history: DM2 Outpatient Diabetes medications: Januvia qd Current orders for Inpatient glycemic control: No orders  Inpatient Diabetes Program Recommendations:   Spoke with PA Alfredia Client.  Shared concern that patient needs to hold Januvia when she is not able to eat and drink.  Thank you, Nani Gasser. Tyra Michelle, RN, MSN, CDE  Diabetes Coordinator Inpatient Glycemic Control Team Team Pager [phone] (8am-5pm) 07/12/2020 10:47 AM

## 2020-07-12 NOTE — ED Notes (Signed)
Reviewed discharge instructions with patient. Follow-up care and medications reviewed. Patient verbalized understanding. Patient A&Ox4, VSS upon discharge. 

## 2020-07-12 NOTE — ED Provider Notes (Signed)
Care of the patient was assumed from B. Haywood Pao at 34 see this provider's note for complete history of present illness, review of systems, and physical exam.  Briefly, the patient is a 65 y.o. female who presented to the ED with nausea vomiting.  No abdominal pain.  History significant for diabetes, gastroparesis.  Patient presenting feeling dehydrated, thinks that she started to have this episode due to not eating appropriately, previous provider thinks that this is most likely due to gastroparesis.  Plan at time of handoff:  Pass PO challenge and then discharge.    Physical Exam  BP (!) 175/98   Pulse 99   Temp 98.5 F (36.9 C) (Oral)   Resp 16   Ht 5\' 10"  (1.778 m)   Wt 122.5 kg   SpO2 97%   BMI 38.74 kg/m   Physical Exam Constitutional:      General: She is not in acute distress.    Appearance: Normal appearance. She is not ill-appearing, toxic-appearing or diaphoretic.  Cardiovascular:     Rate and Rhythm: Normal rate and regular rhythm.     Pulses: Normal pulses.  Pulmonary:     Effort: Pulmonary effort is normal.     Breath sounds: Normal breath sounds.  Musculoskeletal:        General: Normal range of motion.  Skin:    General: Skin is warm and dry.     Capillary Refill: Capillary refill takes less than 2 seconds.  Neurological:     General: No focal deficit present.     Mental Status: She is alert and oriented to person, place, and time.  Psychiatric:        Mood and Affect: Mood normal.        Behavior: Behavior normal.        Thought Content: Thought content normal.     ED Course/Procedures     Procedures  MDM  65 year old well-appearing female who presents to the emergency department today for intractable nausea and vomiting.  See previous providers note for MDM.  Patient's is still vomiting after second IV fluid bolus, will give Phenergan at this time.  Upon reevaluation patient states that she did vomit some more slightly, does not admit to any  pain or other symptoms currently.  Will give some Compazine and reevaluate.Shared decision making about pt coming in for intractable nausea and vomiting.  Patient states that she does not want to do this.  Beta hydroxybutyric acid 0.56, repeat BMP with normal anion gap. Do not suspect that patient is in DKA, do suspect that patient is severely dehydrated.  When speaking to patient she states that she thinks this is exactly like her normal gastroparesis flares.  She states that she thinks this was triggered after she ate a lot of junk food on Saturday.  Has been getting appropriate fluids.  Upon reevaluation patient states that she feels much better, blood pressure remains slightly elevated at 176, patient states that she has not been able to take her blood pressure medication while she has been here, unfortunately she has been here for 21 hours.  Patient passed p.o. challenge with her amlodipine, states that she does not feel nauseous anymore.  Patient states that she is ready to be discharged.  Did speak to patient about elevated blood pressure management, patient is asymptomatic and will follow-up with her PCP about this.  Potassium has been repleted in the emergency department.  She  Vitals with BMI 07/12/2020 07/12/2020 07/12/2020  Height - - -  Weight - - -  BMI - - -  Systolic [phone]  Diastolic 99 92 93  Pulse 503 97 99    Doubt need for further emergent work up at this time. I explained the diagnosis and have given explicit precautions to return to the ER including for any other new or worsening symptoms. The patient understands and accepts the medical plan as it's been dictated and I have answered their questions. Discharge instructions concerning home care and prescriptions have been given. The patient is STABLE and is discharged to home in good condition.         Alfredia Client, PA-C 07/12/20 1114    Tegeler, Gwenyth Allegra, MD 07/12/20 [phone]

## 2020-07-12 NOTE — Discharge Instructions (Addendum)
You are seen today for nausea vomiting, as we discussed this most likely due to your gastroparesis.  Your blood pressure was elevated today, most likely because you have not been taking your blood pressure medications, I want you to take these when you get home as directed.  I want you to follow-up with your primary care doctor about your diabetes regimen, as we discussed the Januvia could be causing some dehydration.  If you are able to eat when you get home please take this.  I also want you to follow-up with your PCP about your gastroparesis.  If you have any new or worsening concerning symptoms please come back to the emergency department.  Make sure to stay hydrated, and continue to check your blood pressure, you  need to also speak to your primary care about this as well.  If you have any chest pain or shortness of breath please come back to the emergency depar  Get help right away if you: Have severe pain in your abdomen that does not improve with treatment. Have nausea that is severe or does not go away. Vomit every time you drink fluids.

## 2020-07-12 NOTE — ED Provider Notes (Signed)
[facility] EMERGENCY DEPARTMENT Provider Note   CSN: [id number] Arrival date & time: 07/11/20  1350     History Chief Complaint  Patient presents with  . Vomiting  . Hyperglycemia    Sara [name] is a 65 y.o. female.  The history is provided by the patient and medical records. No language interpreter was used.  Hyperglycemia    65 year old female significant history of diabetes, hypertension, hypercholesterol, gastroparesis, presenting complaining of nausea and vomiting.  Patient reports since yesterday she has had persistent nausea, vomited multiple episodes of nonbloody nonbilious contents and having some loose stools.  She feels weak and dehydrated from the fluid loss.  She does not complain of any fever chills chest pain shortness of breath runny nose sneezing coughing sore throat loss of taste or smell abdominal pain back pain dysuria or hematuria.  She mentions she has had similar episodes like this in the past usually 8 to poor eating habit that caused her to be dehydrated, usually improved with IV fluid.  She has been compliant with her medication.  She has been fully vaccinated for COVID-19.  She denies alcohol or tobacco use.  She felt this episode is due to not eating appropriately.  She denies having any active pain.  Vomitus is nonbloody nonbilious.  Past Medical History:  Diagnosis Date  . High cholesterol   . Hypertension   . Hypothyroidism   . Nausea and vomiting since 08/07/2016  . Type II diabetes mellitus [facility])     Patient Active Problem List   Diagnosis Date Noted  . Hypovolemic shock ([place]) 01/18/2018  . Nausea and vomiting 01/18/2018  . Diarrhea 01/18/2018  . Hyponatremia 01/18/2018  . Dyslipidemia associated with type 2 diabetes mellitus ([place]) 08/19/2016  . Acute kidney failure ([place]) 08/18/2016  . Hypokalemia 08/08/2016  . Hypothyroidism 07/04/2011  . HTN (hypertension) 07/04/2011    Past Surgical History:  Procedure  Laterality Date  . COLONOSCOPY  08/07/2016  . DILATION AND CURETTAGE OF UTERUS    . ESOPHAGOGASTRODUODENOSCOPY  07/06/2011   Procedure: ESOPHAGOGASTRODUODENOSCOPY (EGD);  Surgeon: Beryle Beams, MD;  Location: [facility] ENDOSCOPY;  Service: Endoscopy;  Laterality: N/A;  . OOPHORECTOMY Right 1990s   "& cyst w/it"  . TOTAL THYROIDECTOMY  2006     OB History   No obstetric history on file.     Family History  Problem Relation Age of Onset  . Diabetes Mellitus II Mother     Social History   Tobacco Use  . Smoking status: Former Smoker    Packs/day: 0.50    Years: 12.00    Pack years: 6.00    Types: Cigarettes    Quit date: 1991    Years since quitting: 31.0  . Smokeless tobacco: Never Used  Substance Use Topics  . Alcohol use: Yes    Alcohol/week: 1.0 standard drink    Types: 1 Cans of beer per week  . Drug use: Yes    Types: Marijuana    Home Medications Prior to Admission medications   Medication Sig Start Date End Date Taking? Authorizing Provider  acetaminophen (TYLENOL) 500 MG tablet Take 1,000 mg by mouth every 6 (six) hours as needed for headache (pain).    [provider]  amLODipine (NORVASC) 10 MG tablet Take 1 tablet (10 mg total) by mouth daily. Hold, discuss BP management with PCP Patient taking differently: Take 10 mg by mouth daily.  01/20/18   Cristal Ford, DO  aspirin EC 81 MG tablet  Take 81 mg by mouth daily.    [provider]  atenolol (TENORMIN) 100 MG tablet Take 1 tablet (100 mg total) by mouth daily. Hold, discuss BP management with PCP 01/20/18   Cristal Ford, DO  Calcium-Magnesium-Vitamin D (CITRACAL SLOW RELEASE PO) Take 1 tablet by mouth daily.    [provider]  Cholecalciferol (VITAMIN D3) 2000 UNITS TABS Take 2,000 Units by mouth daily.     [provider]  furosemide (LASIX) 20 MG tablet Take 1 tablet (20 mg total) by mouth daily as needed. Patient not taking: Reported on 10/20/2018 08/20/16   Theodis Blaze, MD  latanoprost (XALATAN) 0.005 % ophthalmic solution Place 1 drop into both eyes at bedtime. 01/16/18   [provider]  levothyroxine (SYNTHROID) 150 MCG tablet Take 150 mcg by mouth daily. 11/04/19   [provider]  lisinopril (PRINIVIL,ZESTRIL) 20 MG tablet Take 1 tablet (20 mg total) by mouth daily. Hold, discuss BP management with PCP Patient not taking: Reported on 11/06/2019 01/20/18   Cristal Ford, DO  lisinopril-hydrochlorothiazide (ZESTORETIC) 10-12.5 MG tablet Take 1 tablet by mouth daily. 10/18/19   [provider]  metoCLOPramide (REGLAN) 10 MG tablet Take 1 tablet (10 mg total) by mouth every 6 (six) hours as needed for nausea (nausea/headache). 11/06/19   Deno Etienne, DO  pantoprazole (PROTONIX) 40 MG tablet Take 40 mg by mouth daily. 10/27/19   [provider]  promethazine (PHENERGAN) 25 MG suppository Place 1 suppository (25 mg total) rectally every 8 (eight) hours as needed for nausea or vomiting. Patient not taking: Reported on 11/06/2019 10/20/18   Barrie Folk, PA-C  promethazine (PHENERGAN) 25 MG tablet Take 1 tablet (25 mg total) by mouth every 6 (six) hours as needed for nausea or vomiting. Patient not taking: Reported on 11/06/2019 10/20/18   Barrie Folk, PA-C  simvastatin (ZOCOR) 20 MG tablet Take 20 mg by mouth at bedtime.     [provider]  sitaGLIPtin (JANUVIA) 100 MG tablet Take 100 mg by mouth daily.    [provider]    Allergies    Dyazide [hydrochlorothiazide w-triamterene]  Review of Systems   Review of Systems  All other systems reviewed and are negative.   Physical Exam Updated Vital Signs BP (!) 156/77 (BP Location: Right Arm)   Pulse 92   Temp 98.5 F (36.9 C) (Oral)   Resp 18   SpO2 100%   Physical Exam Vitals and nursing note reviewed.  Constitutional:      General: She is not in acute distress.    Appearance: She is well-developed and well-nourished. She is obese.   HENT:     Head: Atraumatic.     Mouth/Throat:     Mouth: Mucous membranes are dry.  Eyes:     Conjunctiva/sclera: Conjunctivae normal.  Cardiovascular:     Rate and Rhythm: Normal rate and regular rhythm.     Pulses: Normal pulses.     Heart sounds: Normal heart sounds.  Pulmonary:     Effort: Pulmonary effort is normal.     Breath sounds: Normal breath sounds. No wheezing, rhonchi or rales.  Abdominal:     Palpations: Abdomen is soft.     Tenderness: There is no abdominal tenderness.  Musculoskeletal:     Cervical back: Neck supple.  Skin:    Findings: No rash.  Neurological:     Mental Status: She is alert and oriented to person, place, and time.  Psychiatric:  Mood and Affect: Mood and affect and mood normal.     ED Results / Procedures / Treatments   Labs (all labs ordered are listed, but only abnormal results are displayed) Labs Reviewed  COMPREHENSIVE METABOLIC PANEL - Abnormal; Notable for the following components:      Result Value   Sodium 134 (*)    Potassium 3.1 (*)    Chloride 95 (*)    Glucose, Bld 262 (*)    Total Protein 8.7 (*)    Anion gap 16 (*)    All other components within normal limits  CBC - Abnormal; Notable for the following components:   WBC 12.5 (*)    RBC 5.38 (*)    All other components within normal limits  CBG MONITORING, ED - Abnormal; Notable for the following components:   Glucose-Capillary 257 (*)    All other components within normal limits  CBG MONITORING, ED - Abnormal; Notable for the following components:   Glucose-Capillary 215 (*)    All other components within normal limits  LIPASE, BLOOD  URINALYSIS, ROUTINE W REFLEX MICROSCOPIC    EKG EKG Interpretation  Date/Time:  Sunday July 11 2020 15:03:01 EST Ventricular Rate:  91 PR Interval:  162 QRS Duration: 94 QT Interval:  380 QTC Calculation: 467 R Axis:   58 Text Interpretation: Normal sinus rhythm Minimal voltage criteria for LVH, may be normal  variant ( R in aVL ) ST & T wave abnormality, consider inferior ischemia ST & T wave abnormality, consider anterior ischemia Abnormal ECG Confirmed by Addison Lank [phone]) on 07/12/2020 3:35:25 AM   Radiology No results found.  Procedures Procedures (including critical care time)  Medications Ordered in ED Medications  sodium chloride 0.9 % bolus 1,000 mL (0 mLs Intravenous Stopped 07/12/20 0557)    Followed by  sodium chloride 0.9 % bolus 1,000 mL (0 mLs Intravenous Stopped 07/12/20 0644)    Followed by  0.9 %  sodium chloride infusion (1,000 mLs Intravenous New Bag/Given 07/12/20 0614)  ondansetron (ZOFRAN) injection 4 mg (4 mg Intravenous Given 07/12/20 0502)  potassium chloride 10 mEq in 100 mL IVPB (0 mEq Intravenous Stopped 07/12/20 0614)  potassium chloride SA (KLOR-CON) CR tablet 40 mEq (40 mEq Oral Given 07/12/20 0501)  labetalol (NORMODYNE) injection 20 mg (20 mg Intravenous Given 07/12/20 [id number])    ED Course  I have reviewed the triage vital signs and the nursing notes.  Pertinent labs & imaging results that were available during my care of the patient were reviewed by me and considered in my medical decision making (see chart for details).    MDM Rules/Calculators/A&P                          BP (!) 175/98   Pulse 99   Temp 98.5 F (36.9 C) (Oral)   Resp 16   Ht 5\' 10"  (1.778 m)   Wt 122.5 kg   SpO2 97%   BMI 38.74 kg/m   Final Clinical Impression(s) / ED Diagnoses Final diagnoses:  Intractable nausea and vomiting    Rx / DC Orders ED Discharge Orders    None     4:35 AM Patient with history of diabetes and history of gastroparesis presenting complaint of nausea and vomiting and elevated blood sugar.  She has been seen in the ED multiple times in the past for similar complaint usually improved with IV fluid.  No history of marijuana abuse to suggest cannabinol hyperemesis  syndrome.  She has been fully vaccinated for COVID-19 and does not had any other cold  symptoms aside from nausea vomiting diarrhea.  Labs significant for elevated CBG of 257, and anion gap of 16 and hypokalemia with a potassium of 3.1.  Mild elevated white count of 12.5.  Normal lipase.  Patient has a soft nontender abdomen on exam and overall well-appearing.  She does appears dry, IV fluid ordered along with antinausea medication.  Please note patient has been waiting in the waiting room for 14 hours due to shortage of staff as well as inclement weather.   6:51 AM Pt sign out to oncoming provider who will reassess after pt receive 2L of IVF.  If pt tolerates PO, she can be discharge home with outpt f/u.      Domenic Moras, PA-C 07/12/20 [id number]    Maudie Flakes, MD 07/13/20 [phone]

## 2020-07-19 ENCOUNTER — Ambulatory Visit
Admission: RE | Admit: 2020-07-19 | Discharge: 2020-07-19 | Disposition: A | Discharge status: Home / Self Care | Payer: BC Managed Care – PPO | Source: Ambulatory Visit | Attending: Internal Medicine | Admitting: Internal Medicine

## 2020-07-19 ENCOUNTER — Other Ambulatory Visit: Payer: Self-pay | Admitting: Internal Medicine

## 2020-07-19 DIAGNOSIS — R0789 Other chest pain: Secondary | ICD-10-CM

## 2021-05-08 ENCOUNTER — Emergency Department (HOSPITAL_COMMUNITY)
Admission: EM | Admit: 2021-05-08 | Discharge: 2021-05-09 | Disposition: A | Discharge status: Home / Self Care | Payer: BC Managed Care – PPO | Attending: Emergency Medicine | Admitting: Emergency Medicine

## 2021-05-08 DIAGNOSIS — Z79899 Other long term (current) drug therapy: Secondary | ICD-10-CM | POA: Diagnosis not present

## 2021-05-08 DIAGNOSIS — Z7982 Long term (current) use of aspirin: Secondary | ICD-10-CM | POA: Insufficient documentation

## 2021-05-08 DIAGNOSIS — E039 Hypothyroidism, unspecified: Secondary | ICD-10-CM | POA: Diagnosis not present

## 2021-05-08 DIAGNOSIS — E119 Type 2 diabetes mellitus without complications: Secondary | ICD-10-CM | POA: Diagnosis not present

## 2021-05-08 DIAGNOSIS — R112 Nausea with vomiting, unspecified: Secondary | ICD-10-CM | POA: Insufficient documentation

## 2021-05-08 DIAGNOSIS — Z87891 Personal history of nicotine dependence: Secondary | ICD-10-CM | POA: Insufficient documentation

## 2021-05-08 DIAGNOSIS — I10 Essential (primary) hypertension: Secondary | ICD-10-CM | POA: Diagnosis not present

## 2021-05-08 LAB — CBC WITH DIFFERENTIAL/PLATELET
Abs Immature Granulocytes: 0.07 10*3/uL (ref 0.00–0.07)
Basophils Absolute: 0 10*3/uL (ref 0.0–0.1)
Basophils Relative: 0 %
Eosinophils Absolute: 0 10*3/uL (ref 0.0–0.5)
Eosinophils Relative: 0 %
HCT: 47.6 % — ABNORMAL HIGH (ref 36.0–46.0)
Hemoglobin: 15.1 g/dL — ABNORMAL HIGH (ref 12.0–15.0)
Immature Granulocytes: 1 %
Lymphocytes Relative: 10 %
Lymphs Abs: 1.2 10*3/uL (ref 0.7–4.0)
MCH: 27.6 pg (ref 26.0–34.0)
MCHC: 31.7 g/dL (ref 30.0–36.0)
MCV: 86.9 fL (ref 80.0–100.0)
Monocytes Absolute: 0.3 10*3/uL (ref 0.1–1.0)
Monocytes Relative: 2 %
Neutro Abs: 10.8 10*3/uL — ABNORMAL HIGH (ref 1.7–7.7)
Neutrophils Relative %: 87 %
Platelets: 360 10*3/uL (ref 150–400)
RBC: 5.48 MIL/uL — ABNORMAL HIGH (ref 3.87–5.11)
RDW: 15 % (ref 11.5–15.5)
WBC: 12.4 10*3/uL — ABNORMAL HIGH (ref 4.0–10.5)
nRBC: 0 % (ref 0.0–0.2)

## 2021-05-08 LAB — BASIC METABOLIC PANEL
Anion gap: 15 (ref 5–15)
BUN: 10 mg/dL (ref 8–23)
CO2: 24 mmol/L (ref 22–32)
Calcium: 10.1 mg/dL (ref 8.9–10.3)
Chloride: 96 mmol/L — ABNORMAL LOW (ref 98–111)
Creatinine, Ser: 0.81 mg/dL (ref 0.44–1.00)
GFR, Estimated: >60 mL/min (ref >60)
Glucose, Bld: 217 mg/dL — ABNORMAL HIGH (ref 70–99)
Potassium: 3.8 mmol/L (ref 3.5–5.1)
Sodium: 135 mmol/L (ref 135–145)

## 2021-05-08 LAB — URINALYSIS, ROUTINE W REFLEX MICROSCOPIC
Bilirubin Urine: NEGATIVE
Glucose, UA: >=500 mg/dL — AB
Hgb urine dipstick: NEGATIVE
Ketones, ur: 20 mg/dL — AB
Leukocytes,Ua: NEGATIVE
Nitrite: NEGATIVE
Protein, ur: >=300 mg/dL — AB
Specific Gravity, Urine: 1.02 (ref 1.005–1.030)
pH: 7 (ref 5.0–8.0)

## 2021-05-08 LAB — LIPASE, BLOOD: Lipase: 24 U/L (ref 11–51)

## 2021-05-08 MED ORDER — ONDANSETRON 4 MG PO TBDP
4.0000 mg | ORAL_TABLET | Freq: Once | ORAL | Status: AC | PRN
  Administered 2021-05-08: 4 mg via ORAL
  Filled 2021-05-08: qty 1

## 2021-05-08 NOTE — ED Provider Notes (Signed)
Emergency Medicine Provider Triage Evaluation Note  Sara [name] , a 65 y.o. female  was evaluated in triage.  Pt complains of nausea, vomiting and likely dehydration.  Reports that this happens at least once a year with her diabetes and that she needs a few bags of fluid before she can go home.  Nausea and vomiting hit her head about 2 AM.  Reports her at home blood sugars have been between 120 and 180.  Not dizzy or nauseous in this moment.  Review of Systems  Positive: Weakness Negative: Dizzy, nausea, chest pain, difficulty breathing  Physical Exam  BP (!) 112/97   Pulse 69   Temp 98.4 F (36.9 C)   Resp 14   SpO2 100%  Gen:   Awake, no distress   Resp:  Normal effort  MSK:   Moves extremities without difficulty  Other:    Medical Decision Making  Medically screening exam initiated at 2:42 PM.  Appropriate orders placed.  Sara [name] was informed that the remainder of the evaluation will be completed by another provider, this initial triage assessment does not replace that evaluation, and the importance of remaining in the ED until their evaluation is complete.     Rhae Hammock, PA-C 05/08/21 1443    Godfrey Pick, MD 05/09/21 [phone]

## 2021-05-08 NOTE — ED Triage Notes (Signed)
Patient developed nausea, vomiting and diarrhea this am 0200. Denis fever, denies abd. pain

## 2021-05-09 LAB — CBC WITH DIFFERENTIAL/PLATELET
Abs Immature Granulocytes: 0.06 10*3/uL (ref 0.00–0.07)
Basophils Absolute: 0 10*3/uL (ref 0.0–0.1)
Basophils Relative: 0 %
Eosinophils Absolute: 0 10*3/uL (ref 0.0–0.5)
Eosinophils Relative: 0 %
HCT: 46.5 % — ABNORMAL HIGH (ref 36.0–46.0)
Hemoglobin: 15 g/dL (ref 12.0–15.0)
Immature Granulocytes: 0 %
Lymphocytes Relative: 9 %
Lymphs Abs: 1.3 10*3/uL (ref 0.7–4.0)
MCH: 27.5 pg (ref 26.0–34.0)
MCHC: 32.3 g/dL (ref 30.0–36.0)
MCV: 85.2 fL (ref 80.0–100.0)
Monocytes Absolute: 0.9 10*3/uL (ref 0.1–1.0)
Monocytes Relative: 7 %
Neutro Abs: 11.9 10*3/uL — ABNORMAL HIGH (ref 1.7–7.7)
Neutrophils Relative %: 84 %
Platelets: 371 10*3/uL (ref 150–400)
RBC: 5.46 MIL/uL — ABNORMAL HIGH (ref 3.87–5.11)
RDW: 15.1 % (ref 11.5–15.5)
WBC: 14.2 10*3/uL — ABNORMAL HIGH (ref 4.0–10.5)
nRBC: 0 % (ref 0.0–0.2)

## 2021-05-09 LAB — BASIC METABOLIC PANEL
Anion gap: 15 (ref 5–15)
BUN: 21 mg/dL (ref 8–23)
CO2: 26 mmol/L (ref 22–32)
Calcium: 10.3 mg/dL (ref 8.9–10.3)
Chloride: 92 mmol/L — ABNORMAL LOW (ref 98–111)
Creatinine, Ser: 1.18 mg/dL — ABNORMAL HIGH (ref 0.44–1.00)
GFR, Estimated: 51 mL/min — ABNORMAL LOW (ref >60)
Glucose, Bld: 239 mg/dL — ABNORMAL HIGH (ref 70–99)
Potassium: 4.1 mmol/L (ref 3.5–5.1)
Sodium: 133 mmol/L — ABNORMAL LOW (ref 135–145)

## 2021-05-09 MED ORDER — ONDANSETRON 4 MG PO TBDP: 4.0000 mg | ORAL_TABLET | Freq: Three times a day (TID) | ORAL | 0 refills | Status: DC | PRN

## 2021-05-09 MED ORDER — SODIUM CHLORIDE 0.9 % IV BOLUS
1000.0000 mL | Freq: Once | INTRAVENOUS | Status: AC
  Administered 2021-05-09: 1000 mL via INTRAVENOUS

## 2021-05-09 MED ORDER — ONDANSETRON 4 MG PO TBDP
4.0000 mg | ORAL_TABLET | Freq: Once | ORAL | Status: AC
  Administered 2021-05-09: 4 mg via ORAL
  Filled 2021-05-09: qty 1

## 2021-05-09 NOTE — ED Provider Notes (Signed)
Sara [name]   CSN: [id number] Arrival date & time: 05/08/21  1342     History No chief complaint on file.   Sara [name] is a 65 y.o. female.  65 year old female with prior medical history as detailed below presents for evaluation.  Patient reports that yesterday morning she developed nausea and some vomiting.  She reports continued symptoms of nausea for more than 24 hours.  Her last emesis was around 4 AM here in the ED waiting room.  She reports that in the past she has had episodes similar to today's and yesterday symptoms.  Symptoms improved significantly with IV fluids.  She denies prior admission secondary to severe dehydration or continued uncontrolled vomiting.  She was given a dose of p.o. Zofran yesterday with good effect.  Her wait time in the waiting room prior to my evaluation in the exam room is approximately 18 hours.  She reports 1 episode of emesis during that time.  She appears to be comfortable at the time of my initial evaluation.  She is requesting IV fluids.  The history is provided by the patient.  Illness Location:  Nausea, vomiting Severity:  Mild Onset quality:  Gradual Duration:  2 days Timing:  Intermittent Progression:  Waxing and waning Chronicity:  New     Past Medical History:  Diagnosis Date   High cholesterol    Hypertension    Hypothyroidism    Nausea and vomiting since 08/07/2016   Type II diabetes mellitus [facility])     Patient Active Problem List   Diagnosis Date Noted   Hypovolemic shock ([place]) 01/18/2018   Nausea and vomiting 01/18/2018   Diarrhea 01/18/2018   Hyponatremia 01/18/2018   Dyslipidemia associated with type 2 diabetes mellitus ([place]) 08/19/2016   Acute kidney failure ([place]) 08/18/2016   Hypokalemia 08/08/2016   Hypothyroidism 07/04/2011   HTN (hypertension) 07/04/2011    Past Surgical History:  Procedure Laterality Date   COLONOSCOPY  08/07/2016   DILATION  AND CURETTAGE OF UTERUS     ESOPHAGOGASTRODUODENOSCOPY  07/06/2011   Procedure: ESOPHAGOGASTRODUODENOSCOPY (EGD);  Surgeon: Beryle Beams, MD;  Location: [facility] ENDOSCOPY;  Service: Endoscopy;  Laterality: N/A;   OOPHORECTOMY Right 1990s   "& cyst w/it"   TOTAL THYROIDECTOMY  2006     OB History   No obstetric history on file.     Family History  Problem Relation Age of Onset   Diabetes Mellitus II Mother     Social History   Tobacco Use   Smoking status: Former    Packs/day: 0.50    Years: 12.00    Pack years: 6.00    Types: Cigarettes    Quit date: 1991    Years since quitting: 31.8   Smokeless tobacco: Never  Substance Use Topics   Alcohol use: Yes    Alcohol/week: 1.0 standard drink    Types: 1 Cans of beer per week   Drug use: Yes    Types: Marijuana    Home Medications Prior to Admission medications   Medication Sig Start Date End Date Taking? Authorizing Provider  acetaminophen (TYLENOL) 500 MG tablet Take 1,000 mg by mouth every 6 (six) hours as needed for headache or mild pain (pain).    [provider]  amLODipine (NORVASC) 10 MG tablet Take 1 tablet (10 mg total) by mouth daily. Hold, discuss BP management with PCP Patient taking differently: Take 10 mg by mouth daily. 01/20/18   Cristal Ford, DO  aspirin EC 81 MG tablet Take 81 mg by mouth daily.    [provider]  atenolol (TENORMIN) 100 MG tablet Take 1 tablet (100 mg total) by mouth daily. Hold, discuss BP management with PCP Patient taking differently: Take 100 mg by mouth daily. 01/20/18   Mikhail, Velta Addison, DO  Calcium-Magnesium-Vitamin D (CITRACAL SLOW RELEASE PO) Take 1 tablet by mouth daily.    [provider]  Cholecalciferol (VITAMIN D3) 2000 UNITS TABS Take 2,000 Units by mouth daily.    [provider]  furosemide (LASIX) 20 MG tablet Take 1 tablet (20 mg total) by mouth daily as needed. Patient not taking: Reported on 07/12/2020 08/20/16   Theodis Blaze, MD   KLOR-CON M10 10 MEQ tablet Take 10 mEq by mouth daily. 06/24/20   [provider]  latanoprost (XALATAN) 0.005 % ophthalmic solution Place 1 drop into both eyes at bedtime. 01/16/18   [provider]  levothyroxine (SYNTHROID) 150 MCG tablet Take 150 mcg by mouth daily. 11/04/19   [provider]  lisinopril (PRINIVIL,ZESTRIL) 20 MG tablet Take 1 tablet (20 mg total) by mouth daily. Hold, discuss BP management with PCP Patient not taking: Reported on 07/12/2020 01/20/18   Cristal Ford, DO  lisinopril-hydrochlorothiazide (ZESTORETIC) 10-12.5 MG tablet Take 1 tablet by mouth daily. 10/18/19   [provider]  metoCLOPramide (REGLAN) 10 MG tablet Take 1 tablet (10 mg total) by mouth every 6 (six) hours as needed for nausea (nausea/headache). 11/06/19   Deno Etienne, DO  Multiple Vitamin (MULTIVITAMIN) tablet Take 1 tablet by mouth daily.    [provider]  pantoprazole (PROTONIX) 40 MG tablet Take 40 mg by mouth daily. 10/27/19   [provider]  promethazine (PHENERGAN) 25 MG suppository Place 1 suppository (25 mg total) rectally every 8 (eight) hours as needed for nausea or vomiting. Patient not taking: Reported on 07/12/2020 10/20/18   Barrie Folk, PA-C  promethazine (PHENERGAN) 25 MG tablet Take 1 tablet (25 mg total) by mouth every 6 (six) hours as needed for nausea or vomiting. 10/20/18   Barrie Folk, PA-C  simvastatin (ZOCOR) 20 MG tablet Take 20 mg by mouth at bedtime.     [provider]  sitaGLIPtin (JANUVIA) 100 MG tablet Take 100 mg by mouth daily.    [provider]  vitamin C (ASCORBIC ACID) 500 MG tablet Take 500 mg by mouth daily.    [provider]    Allergies    Dyazide [hydrochlorothiazide w-triamterene]  Review of Systems   Review of Systems  All other systems reviewed and are negative.  Physical Exam Updated Vital Signs BP (!) 190/94   Pulse 96   Temp 98.2 F (36.8 C)  (Oral)   Resp 17   SpO2 100%   Physical Exam Vitals and nursing [name] reviewed.  Constitutional:      General: She is not in acute distress.    Appearance: Normal appearance. She is well-developed.  HENT:     Head: Normocephalic and atraumatic.  Eyes:     Conjunctiva/sclera: Conjunctivae normal.     Pupils: Pupils are equal, round, and reactive to light.  Cardiovascular:     Rate and Rhythm: Normal rate and regular rhythm.     Heart sounds: Normal heart sounds.  Pulmonary:     Effort: Pulmonary effort is normal. No respiratory distress.     Breath sounds: Normal breath sounds.  Abdominal:     General: There is no distension.  Palpations: Abdomen is soft.     Tenderness: There is no abdominal tenderness.  Musculoskeletal:        General: No deformity. Normal range of motion.     Cervical back: Normal range of motion and neck supple.  Skin:    General: Skin is warm and dry.  Neurological:     General: No focal deficit present.     Mental Status: She is alert and oriented to person, place, and time.    ED Results / Procedures / Treatments   Labs (all labs ordered are listed, but only abnormal results are displayed) Labs Reviewed  BASIC METABOLIC PANEL - Abnormal; Notable for the following components:      Result Value   Chloride 96 (*)    Glucose, Bld 217 (*)    All other components within normal limits  CBC WITH DIFFERENTIAL/PLATELET - Abnormal; Notable for the following components:   WBC 12.4 (*)    RBC 5.48 (*)    Hemoglobin 15.1 (*)    HCT 47.6 (*)    Neutro Abs 10.8 (*)    All other components within normal limits  URINALYSIS, ROUTINE W REFLEX MICROSCOPIC - Abnormal; Notable for the following components:   Glucose, UA >=500 (*)    Ketones, ur 20 (*)    Protein, ur >=300 (*)    Bacteria, UA RARE (*)    All other components within normal limits  LIPASE, BLOOD  BASIC METABOLIC PANEL  CBC WITH DIFFERENTIAL/PLATELET  CBG MONITORING, ED     EKG None  Radiology No results found.  Procedures Procedures   Medications Ordered in ED Medications  sodium chloride 0.9 % bolus 1,000 mL (has no administration in time range)  ondansetron (ZOFRAN-ODT) disintegrating tablet 4 mg (4 mg Oral Given 05/08/21 1450)  ondansetron (ZOFRAN-ODT) disintegrating tablet 4 mg (4 mg Oral Given 05/09/21 0277)    ED Course  I have reviewed the triage vital signs and the nursing notes.  Pertinent labs & imaging results that were available during my care of the patient were reviewed by me and considered in my medical decision making (see chart for details).    MDM Rules/Calculators/A&P                           MDM  MSE complete  Sara [name] was evaluated in Emergency Department on 05/09/2021 for the symptoms described in the history of present illness. She was evaluated in the context of the global COVID-19 pandemic, which necessitated consideration that the patient might be at risk for infection with the SARS-CoV-2 virus that causes COVID-19. Institutional protocols and algorithms that pertain to the evaluation of patients at risk for COVID-19 are in a state of rapid change based on information released by regulatory bodies including the CDC and federal and state organizations. These policies and algorithms were followed during the patient's care in the ED.  Patient is presenting with complaint of nausea and vomiting for 36 hours.  Patient reports significant improvement with p.o. Zofran.  Patient is adamant that she needs 1 to 2 L of IV fluids to help treat her dehydration.  Labs obtained are without significant acute pathology.  Patient is glucose is noted to be approximately 239.  Creatinine today is 1.18.  Anion gap is 15.  Baseline creatinine appears to be around 0.8.  Patient reports feeling significantly improved after 1 L of IV fluids.  She request a second liter of IVF.  She declines admission.  She desires  discharge after completion of 2 L of IV fluids.  She is taking good p.o. She is without complaint of pain or fever.  Portance of close follow-up was stressed.  Strict return precautions given and understood.    Final Clinical Impression(s) / ED Diagnoses Final diagnoses:  Nausea and vomiting, unspecified vomiting type    Rx / DC Orders ED Discharge Orders          Ordered    ondansetron (ZOFRAN ODT) 4 MG disintegrating tablet  Every 8 hours PRN        05/09/21 1244             Valarie Merino, MD 05/09/21 1245

## 2021-05-09 NOTE — ED Notes (Signed)
Po fluids given

## 2021-05-09 NOTE — Discharge Instructions (Signed)
Return for any problem.   Drink plenty of fluids.   Use Zofran as need for nausea.

## 2021-12-15 ENCOUNTER — Encounter (HOSPITAL_COMMUNITY): Payer: Self-pay | Admitting: Emergency Medicine

## 2021-12-15 ENCOUNTER — Emergency Department (HOSPITAL_COMMUNITY)
Admission: EM | Admit: 2021-12-15 | Discharge: 2021-12-15 | Disposition: A | Discharge status: Home / Self Care | Payer: BC Managed Care – PPO | Attending: Emergency Medicine | Admitting: Emergency Medicine

## 2021-12-15 DIAGNOSIS — R197 Diarrhea, unspecified: Secondary | ICD-10-CM | POA: Diagnosis not present

## 2021-12-15 DIAGNOSIS — R112 Nausea with vomiting, unspecified: Secondary | ICD-10-CM | POA: Insufficient documentation

## 2021-12-15 DIAGNOSIS — R5383 Other fatigue: Secondary | ICD-10-CM

## 2021-12-15 DIAGNOSIS — R531 Weakness: Secondary | ICD-10-CM | POA: Diagnosis not present

## 2021-12-15 DIAGNOSIS — R1084 Generalized abdominal pain: Secondary | ICD-10-CM | POA: Insufficient documentation

## 2021-12-15 DIAGNOSIS — Z7982 Long term (current) use of aspirin: Secondary | ICD-10-CM | POA: Diagnosis not present

## 2021-12-15 DIAGNOSIS — I10 Essential (primary) hypertension: Secondary | ICD-10-CM | POA: Diagnosis not present

## 2021-12-15 DIAGNOSIS — Z79899 Other long term (current) drug therapy: Secondary | ICD-10-CM | POA: Diagnosis not present

## 2021-12-15 LAB — COMPREHENSIVE METABOLIC PANEL
ALT: 16 U/L (ref 0–44)
AST: 18 U/L (ref 15–41)
Albumin: 4.7 g/dL (ref 3.5–5.0)
Alkaline Phosphatase: 103 U/L (ref 38–126)
Anion gap: 14 (ref 5–15)
BUN: 9 mg/dL (ref 8–23)
CO2: 27 mmol/L (ref 22–32)
Calcium: 9.9 mg/dL (ref 8.9–10.3)
Chloride: 97 mmol/L — ABNORMAL LOW (ref 98–111)
Creatinine, Ser: 0.68 mg/dL (ref 0.44–1.00)
GFR, Estimated: >60 mL/min (ref >60)
Glucose, Bld: 254 mg/dL — ABNORMAL HIGH (ref 70–99)
Potassium: 3.7 mmol/L (ref 3.5–5.1)
Sodium: 138 mmol/L (ref 135–145)
Total Bilirubin: 0.5 mg/dL (ref 0.3–1.2)
Total Protein: 8.7 g/dL — ABNORMAL HIGH (ref 6.5–8.1)

## 2021-12-15 LAB — URINALYSIS, ROUTINE W REFLEX MICROSCOPIC
Bilirubin Urine: NEGATIVE
Glucose, UA: >=500 mg/dL — AB
Hgb urine dipstick: NEGATIVE
Ketones, ur: 5 mg/dL — AB
Leukocytes,Ua: NEGATIVE
Nitrite: NEGATIVE
Protein, ur: 100 mg/dL — AB
Specific Gravity, Urine: 1.015 (ref 1.005–1.030)
pH: 8 (ref 5.0–8.0)

## 2021-12-15 LAB — CBC
HCT: 45.5 % (ref 36.0–46.0)
Hemoglobin: 14.9 g/dL (ref 12.0–15.0)
MCH: 28.5 pg (ref 26.0–34.0)
MCHC: 32.7 g/dL (ref 30.0–36.0)
MCV: 87 fL (ref 80.0–100.0)
Platelets: 352 10*3/uL (ref 150–400)
RBC: 5.23 MIL/uL — ABNORMAL HIGH (ref 3.87–5.11)
RDW: 15.4 % (ref 11.5–15.5)
WBC: 9.8 10*3/uL (ref 4.0–10.5)
nRBC: 0 % (ref 0.0–0.2)

## 2021-12-15 LAB — LIPASE, BLOOD: Lipase: 24 U/L (ref 11–51)

## 2021-12-15 MED ORDER — SODIUM CHLORIDE 0.9 % IV BOLUS
1000.0000 mL | Freq: Once | INTRAVENOUS | Status: AC
  Administered 2021-12-15: 1000 mL via INTRAVENOUS

## 2021-12-15 MED ORDER — HYDRALAZINE HCL 20 MG/ML IJ SOLN
10.0000 mg | Freq: Once | INTRAMUSCULAR | Status: AC
  Administered 2021-12-15: 10 mg via INTRAVENOUS
  Filled 2021-12-15: qty 1

## 2021-12-15 MED ORDER — ONDANSETRON HCL 4 MG/2ML IJ SOLN
4.0000 mg | Freq: Once | INTRAMUSCULAR | Status: AC
  Administered 2021-12-15: 4 mg via INTRAVENOUS
  Filled 2021-12-15: qty 2

## 2021-12-15 NOTE — Discharge Instructions (Addendum)
You have been seen and discharged from the emergency department.  You were given IVF and medicine. Follow-up with your primary provider for further evaluation and further care. Take home medications as prescribed. If you have any worsening symptoms or further concerns for your health please return to an emergency department for further evaluation.

## 2021-12-15 NOTE — ED Triage Notes (Signed)
Patient here with complaint of fatigue and right sided abdominal pain that started yesterday. Patient states she believes she is dehydrated because she has not been decreasing her fluid intake due to it causing her to urinate more often. Patient is alert, oriented, and in no apparent distress at this time.

## 2021-12-15 NOTE — ED Provider Notes (Signed)
[facility] EMERGENCY DEPARTMENT Provider Note   CSN: [id number] Arrival date & time: 12/15/21  1129     History  Chief Complaint  Patient presents with   Weakness    Sara [name] is a 66 y.o. female.  HPI  66 year old female with past medical history of HTN, HLD, DM, intermittent chronic episodes of nausea/vomiting presents emergency department with concern for dehydration.  Patient states that she is having one of her "episodes of nausea/vomiting".  And these usually result in dehydration.  Because of the nausea and vomiting she has been unable to tolerate her oral medications, including her hypertensive medications for the past 2 days.  She has generalized abdominal pain/cramping but denies any chest pain, back pain, diarrhea, fever or genitourinary symptoms.  Home Medications Prior to Admission medications   Medication Sig Start Date End Date Taking? Authorizing Provider  acetaminophen (TYLENOL) 500 MG tablet Take 1,000 mg by mouth every 6 (six) hours as needed for headache or mild pain (pain).    [provider]  amLODipine (NORVASC) 10 MG tablet Take 1 tablet (10 mg total) by mouth daily. Hold, discuss BP management with PCP Patient taking differently: Take 10 mg by mouth daily. 01/20/18   Cristal Ford, DO  aspirin EC 81 MG tablet Take 81 mg by mouth daily.    [provider]  atenolol (TENORMIN) 100 MG tablet Take 1 tablet (100 mg total) by mouth daily. Hold, discuss BP management with PCP Patient taking differently: Take 100 mg by mouth daily. 01/20/18   Mikhail, Velta Addison, DO  Calcium-Magnesium-Vitamin D (CITRACAL SLOW RELEASE PO) Take 1 tablet by mouth daily.    [provider]  Cholecalciferol (VITAMIN D3) 2000 UNITS TABS Take 2,000 Units by mouth daily.    [provider]  furosemide (LASIX) 20 MG tablet Take 1 tablet (20 mg total) by mouth daily as needed. Patient not taking: Reported on 07/12/2020 08/20/16    Theodis Blaze, MD  KLOR-CON M10 10 MEQ tablet Take 10 mEq by mouth daily. 06/24/20   [provider]  latanoprost (XALATAN) 0.005 % ophthalmic solution Place 1 drop into both eyes at bedtime. 01/16/18   [provider]  levothyroxine (SYNTHROID) 150 MCG tablet Take 150 mcg by mouth daily. 11/04/19   [provider]  lisinopril (PRINIVIL,ZESTRIL) 20 MG tablet Take 1 tablet (20 mg total) by mouth daily. Hold, discuss BP management with PCP Patient not taking: Reported on 07/12/2020 01/20/18   Cristal Ford, DO  lisinopril-hydrochlorothiazide (ZESTORETIC) 10-12.5 MG tablet Take 1 tablet by mouth daily. 10/18/19   [provider]  metoCLOPramide (REGLAN) 10 MG tablet Take 1 tablet (10 mg total) by mouth every 6 (six) hours as needed for nausea (nausea/headache). 11/06/19   Deno Etienne, DO  Multiple Vitamin (MULTIVITAMIN) tablet Take 1 tablet by mouth daily.    [provider]  ondansetron (ZOFRAN ODT) 4 MG disintegrating tablet Take 1 tablet (4 mg total) by mouth every 8 (eight) hours as needed for nausea or vomiting. 05/09/21   Valarie Merino, MD  pantoprazole (PROTONIX) 40 MG tablet Take 40 mg by mouth daily. 10/27/19   [provider]  promethazine (PHENERGAN) 25 MG suppository Place 1 suppository (25 mg total) rectally every 8 (eight) hours as needed for nausea or vomiting. Patient not taking: Reported on 07/12/2020 10/20/18   Barrie Folk, PA-C  promethazine (PHENERGAN) 25 MG tablet Take 1 tablet (25 mg total) by mouth every 6 (six) hours as needed  for nausea or vomiting. 10/20/18   Sherol Dade E, PA-C  simvastatin (ZOCOR) 20 MG tablet Take 20 mg by mouth at bedtime.     [provider]  sitaGLIPtin (JANUVIA) 100 MG tablet Take 100 mg by mouth daily.    [provider]  vitamin C (ASCORBIC ACID) 500 MG tablet Take 500 mg by mouth daily.    [provider]      Allergies    Dyazide [hydrochlorothiazide  w-triamterene]    Review of Systems   Review of Systems  Constitutional:  Positive for fatigue. Negative for fever.  Respiratory:  Negative for shortness of breath.   Cardiovascular:  Negative for chest pain.  Gastrointestinal:  Positive for nausea and vomiting. Negative for abdominal pain and diarrhea.  Genitourinary:  Negative for dysuria and frequency.  Skin:  Negative for rash.  Neurological:  Negative for syncope, weakness and headaches.    Physical Exam Updated Vital Signs BP (!) 198/96   Pulse 76   Temp 98.7 F (37.1 C) (Oral)   Resp 18   SpO2 96%  Physical Exam Vitals and nursing note reviewed.  Constitutional:      General: She is not in acute distress.    Appearance: Normal appearance.  HENT:     Head: Normocephalic.     Mouth/Throat:     Mouth: Mucous membranes are moist.  Cardiovascular:     Rate and Rhythm: Normal rate.  Pulmonary:     Effort: Pulmonary effort is normal. No respiratory distress.  Abdominal:     General: There is no distension.     Palpations: Abdomen is soft.     Tenderness: There is no abdominal tenderness. There is no guarding or rebound.  Skin:    General: Skin is warm.  Neurological:     Mental Status: She is alert and oriented to person, place, and time. Mental status is at baseline.  Psychiatric:        Mood and Affect: Mood normal.     ED Results / Procedures / Treatments   Labs (all labs ordered are listed, but only abnormal results are displayed) Labs Reviewed  COMPREHENSIVE METABOLIC PANEL - Abnormal; Notable for the following components:      Result Value   Chloride 97 (*)    Glucose, Bld 254 (*)    Total Protein 8.7 (*)    All other components within normal limits  CBC - Abnormal; Notable for the following components:   RBC 5.23 (*)    All other components within normal limits  URINALYSIS, ROUTINE W REFLEX MICROSCOPIC - Abnormal; Notable for the following components:   Color, Urine STRAW (*)    Glucose, UA >=500  (*)    Ketones, ur 5 (*)    Protein, ur 100 (*)    Bacteria, UA RARE (*)    All other components within normal limits  LIPASE, BLOOD    EKG None  Radiology No results found.  Procedures Procedures    Medications Ordered in ED Medications  sodium chloride 0.9 % bolus 1,000 mL (1,000 mLs Intravenous New Bag/Given 12/15/21 1742)  ondansetron (ZOFRAN) injection 4 mg (4 mg Intravenous Given 12/15/21 1737)  hydrALAZINE (APRESOLINE) injection 10 mg (10 mg Intravenous Given 12/15/21 1741)    ED Course/ Medical Decision Making/ A&P                           Medical Decision Making Amount and/or Complexity of Data  Reviewed Labs: ordered.  Risk Prescription drug management.   66 year old female presents emergency department with episodes of nausea/vomiting, fatigue.  She feels dehydrated.  Vital signs are stable on arrival.  Patient is nauseous with some fluid in the emesis bag.  Otherwise her physical exam is very reassuring, benign abdomen.  Work-up is reassuring without any acute abnormalities, belly labs are normal.  Urinalysis shows mild dehydration without acute infection.  After IV fluids and IV treatment patient feels back to baseline, she is been able to tolerate p.o., offers no new concerns or complaints.  Patient at this time appears safe and stable for discharge and close outpatient follow up. Discharge plan and strict return to ED precautions discussed, patient verbalizes understanding and agreement.        Final Clinical Impression(s) / ED Diagnoses Final diagnoses:  None    Rx / DC Orders ED Discharge Orders     None         Lorelle Gibbs, DO 12/15/21 2205

## 2022-03-09 ENCOUNTER — Ambulatory Visit
Admission: RE | Admit: 2022-03-09 | Discharge: 2022-03-09 | Disposition: A | Discharge status: Home / Self Care | Payer: Medicare Other | Source: Ambulatory Visit | Attending: Internal Medicine | Admitting: Internal Medicine

## 2022-03-09 ENCOUNTER — Other Ambulatory Visit: Payer: Self-pay | Admitting: Internal Medicine

## 2022-03-09 DIAGNOSIS — Z0000 Encounter for general adult medical examination without abnormal findings: Secondary | ICD-10-CM | POA: Diagnosis not present

## 2022-03-09 DIAGNOSIS — E039 Hypothyroidism, unspecified: Secondary | ICD-10-CM | POA: Diagnosis not present

## 2022-03-09 DIAGNOSIS — I10 Essential (primary) hypertension: Secondary | ICD-10-CM | POA: Diagnosis not present

## 2022-03-09 DIAGNOSIS — E7800 Pure hypercholesterolemia, unspecified: Secondary | ICD-10-CM | POA: Diagnosis not present

## 2022-03-09 DIAGNOSIS — E1169 Type 2 diabetes mellitus with other specified complication: Secondary | ICD-10-CM | POA: Diagnosis not present

## 2022-03-09 DIAGNOSIS — Z5181 Encounter for therapeutic drug level monitoring: Secondary | ICD-10-CM | POA: Diagnosis not present

## 2022-03-09 DIAGNOSIS — M25562 Pain in left knee: Secondary | ICD-10-CM

## 2022-03-09 DIAGNOSIS — Z23 Encounter for immunization: Secondary | ICD-10-CM | POA: Diagnosis not present

## 2022-03-14 DIAGNOSIS — M25562 Pain in left knee: Secondary | ICD-10-CM | POA: Diagnosis not present

## 2022-05-02 DIAGNOSIS — H10413 Chronic giant papillary conjunctivitis, bilateral: Secondary | ICD-10-CM | POA: Diagnosis not present

## 2022-05-02 DIAGNOSIS — E119 Type 2 diabetes mellitus without complications: Secondary | ICD-10-CM | POA: Diagnosis not present

## 2022-05-02 DIAGNOSIS — H25813 Combined forms of age-related cataract, bilateral: Secondary | ICD-10-CM | POA: Diagnosis not present

## 2022-05-02 DIAGNOSIS — H40013 Open angle with borderline findings, low risk, bilateral: Secondary | ICD-10-CM | POA: Diagnosis not present

## 2022-05-08 DIAGNOSIS — E890 Postprocedural hypothyroidism: Secondary | ICD-10-CM | POA: Diagnosis not present

## 2022-05-08 DIAGNOSIS — Z85850 Personal history of malignant neoplasm of thyroid: Secondary | ICD-10-CM | POA: Diagnosis not present

## 2022-05-29 DIAGNOSIS — M25562 Pain in left knee: Secondary | ICD-10-CM | POA: Diagnosis not present

## 2022-05-30 DIAGNOSIS — M25562 Pain in left knee: Secondary | ICD-10-CM | POA: Diagnosis not present

## 2022-06-14 DIAGNOSIS — M1712 Unilateral primary osteoarthritis, left knee: Secondary | ICD-10-CM | POA: Diagnosis not present

## 2022-06-14 NOTE — H&P (Signed)
KNEE ARTHROPLASTY ADMISSION H&P  Patient ID: JOURNEY CASTONGUAY MRN: [id number] DOB/AGE: August 27, 1955 66 y.o.  Chief Complaint: left knee pain.  Planned Procedure Date: 07/03/21  Medical Clearance by Dr. Delfina Redwood    HPI: Sara [name] is a 66 y.o. female who presents for evaluation of OA LEFT KNEE. The patient has a history of pain and functional disability in the left knee due to arthritis and has failed non-surgical conservative treatments for greater than 12 weeks to include NSAID's and/or analgesics and activity modification.  Onset of symptoms was gradual, starting 5 years ago with gradually worsening course since that time. The patient noted no past surgery on the left knee.  Patient currently rates pain at 7 out of 10 with activity. Patient has worsening of pain with activity and weight bearing and pain that interferes with activities of daily living.  Patient has evidence of joint space narrowing by imaging studies.  There is no active infection.  Past Medical History:  Diagnosis Date   High cholesterol    Hypertension    Hypothyroidism    Nausea and vomiting since 08/07/2016   Type II diabetes mellitus [facility])    Past Surgical History:  Procedure Laterality Date   COLONOSCOPY  08/07/2016   DILATION AND CURETTAGE OF UTERUS     ESOPHAGOGASTRODUODENOSCOPY  07/06/2011   Procedure: ESOPHAGOGASTRODUODENOSCOPY (EGD);  Surgeon: Beryle Beams, MD;  Location: [facility] ENDOSCOPY;  Service: Endoscopy;  Laterality: N/A;   OOPHORECTOMY Right 1990s   "& cyst w/it"   TOTAL THYROIDECTOMY  2006   Allergies  Allergen Reactions   Dyazide [Hydrochlorothiazide W-Triamterene] Other (See Comments)    Unknown reaction - might be nausea. Can take Lisinopril/HCTz now   Prior to Admission medications   Medication Sig Start Date End Date Taking? Authorizing Provider  acetaminophen (TYLENOL) 500 MG tablet Take 1,000 mg by mouth every 6 (six) hours as needed for headache or mild pain (pain).    [provider]  amLODipine (NORVASC) 10 MG tablet Take 1 tablet (10 mg total) by mouth daily. Hold, discuss BP management with PCP Patient taking differently: Take 10 mg by mouth daily. 01/20/18   Cristal Ford, DO  aspirin EC 81 MG tablet Take 81 mg by mouth daily.    [provider]  atenolol (TENORMIN) 100 MG tablet Take 1 tablet (100 mg total) by mouth daily. Hold, discuss BP management with PCP Patient taking differently: Take 100 mg by mouth daily. 01/20/18   Mikhail, Velta Addison, DO  Calcium-Magnesium-Vitamin D (CITRACAL SLOW RELEASE PO) Take 1 tablet by mouth daily.    [provider]  Cholecalciferol (VITAMIN D3) 2000 UNITS TABS Take 2,000 Units by mouth daily.    [provider]  furosemide (LASIX) 20 MG tablet Take 1 tablet (20 mg total) by mouth daily as needed. Patient not taking: Reported on 07/12/2020 08/20/16   Theodis Blaze, MD  KLOR-CON M10 10 MEQ tablet Take 10 mEq by mouth daily. 06/24/20   [provider]  latanoprost (XALATAN) 0.005 % ophthalmic solution Place 1 drop into both eyes at bedtime. 01/16/18   [provider]  levothyroxine (SYNTHROID) 150 MCG tablet Take 150 mcg by mouth daily. 11/04/19   [provider]  lisinopril (PRINIVIL,ZESTRIL) 20 MG tablet Take 1 tablet (20 mg total) by mouth daily. Hold, discuss BP management with PCP Patient not taking: Reported on 07/12/2020 01/20/18   Cristal Ford, DO  lisinopril-hydrochlorothiazide (ZESTORETIC) 10-12.5 MG tablet Take 1 tablet by mouth daily. 10/18/19  [provider]  metoCLOPramide (REGLAN) 10 MG tablet Take 1 tablet (10 mg total) by mouth every 6 (six) hours as needed for nausea (nausea/headache). 11/06/19   Deno Etienne, DO  Multiple Vitamin (MULTIVITAMIN) tablet Take 1 tablet by mouth daily.    [provider]  ondansetron (ZOFRAN ODT) 4 MG disintegrating tablet Take 1 tablet (4 mg total) by mouth every 8 (eight) hours as needed for nausea or  vomiting. 05/09/21   Valarie Merino, MD  pantoprazole (PROTONIX) 40 MG tablet Take 40 mg by mouth daily. 10/27/19   [provider]  promethazine (PHENERGAN) 25 MG suppository Place 1 suppository (25 mg total) rectally every 8 (eight) hours as needed for nausea or vomiting. Patient not taking: Reported on 07/12/2020 10/20/18   Barrie Folk, PA-C  promethazine (PHENERGAN) 25 MG tablet Take 1 tablet (25 mg total) by mouth every 6 (six) hours as needed for nausea or vomiting. 10/20/18   Barrie Folk, PA-C  simvastatin (ZOCOR) 20 MG tablet Take 20 mg by mouth at bedtime.     [provider]  sitaGLIPtin (JANUVIA) 100 MG tablet Take 100 mg by mouth daily.    [provider]  vitamin C (ASCORBIC ACID) 500 MG tablet Take 500 mg by mouth daily.    [provider]   Social History   Socioeconomic History   Marital status: Married    Spouse name: Not on file   Number of children: Not on file   Years of education: Not on file   Highest education level: Not on file  Occupational History   Not on file  Tobacco Use   Smoking status: Former    Packs/day: 0.50    Years: 12.00    Total pack years: 6.00    Types: Cigarettes    Quit date: 12    Years since quitting: 32.9   Smokeless tobacco: Never  Substance and Sexual Activity   Alcohol use: Yes    Alcohol/week: 1.0 standard drink of alcohol    Types: 1 Cans of beer per week   Drug use: Yes    Types: Marijuana   Sexual activity: Not Currently    Birth control/protection: Post-menopausal  Other Topics Concern   Not on file  Social History Narrative   Not on file   Social Determinants of Health   Financial Resource Strain: Not on file  Food Insecurity: Not on file  Transportation Needs: Not on file  Physical Activity: Not on file  Stress: Not on file  Social Connections: Not on file   Family History  Problem Relation Age of Onset   Diabetes Mellitus II Mother     ROS: Currently  denies lightheadedness, dizziness, Fever, chills, CP, SOB.   No personal history of DVT, PE, MI, or CVA. No loose teeth or dentures All other systems have been reviewed and were otherwise currently negative with the exception of those mentioned in the HPI and as above.  Objective: Vitals: Ht: '5\' 8"'$  Wt: 256 lbs Temp: 97.9 BP: 138/86 Pulse: 56 O2 97% on room air.   Physical Exam: General: Alert, NAD.  Antalgic Gait  HEENT: EOMI, Good Neck Extension  Pulm: No increased work of breathing.  Clear B/L A/P w/o crackle or wheeze.  CV: RRR, No m/g/r appreciated  GI: soft, NT, ND Neuro: Neuro without gross focal deficit.  Sensation intact distally Skin: No lesions in the area of chief complaint MSK/Surgical Site: left knee w/o redness or effusion.  Medial and  lateral JLT. ROM 5-90.  5/5 strength in extension and flexion.  +EHL/FHL.  NVI.  Stable varus and valgus stress.    Imaging Review Plain radiographs demonstrate severe degenerative joint disease of the left knee.   The overall alignment issignificant valgus. The bone quality appears to be adequate for age and reported activity level.  Preoperative templating of the joint replacement has been completed, documented, and submitted to the Operating Room personnel in order to optimize intra-operative equipment management.  Assessment: OA LEFT KNEE    Plan: Plan for Procedure(s): TOTAL KNEE ARTHROPLASTY  The patient history, physical exam, clinical judgement of the provider and imaging are consistent with end stage degenerative joint disease and total joint arthroplasty is deemed medically necessary. The treatment options including medical management, injection therapy, and arthroplasty were discussed at length. The risks and benefits of Procedure(s): TOTAL KNEE ARTHROPLASTY were presented and reviewed.  The risks of nonoperative treatment, versus surgical intervention including but not limited to continued pain, aseptic loosening,  stiffness, dislocation/subluxation, infection, bleeding, nerve injury, blood clots, cardiopulmonary complications, morbidity, mortality, among others were discussed. The patient verbalizes understanding and wishes to proceed with the plan.   Dental prophylaxis discussed and recommended for 2 years postoperatively.  The patient does meet the criteria for TXA which will be used perioperatively.   Eliquis  will be used postoperatively for DVT prophylaxis in addition to SCDs, and early ambulation. The patient is planning to be discharged home with OPPT in care of spouse   Jola Baptist 06/14/2022 4:51 PM

## 2022-06-20 NOTE — Patient Instructions (Addendum)
DUE TO COVID-19 ONLY TWO VISITORS  (aged 66 and older)  ARE ALLOWED TO COME WITH YOU AND STAY IN THE WAITING ROOM ONLY DURING PRE OP AND PROCEDURE.   **NO VISITORS ARE ALLOWED IN THE SHORT STAY AREA OR RECOVERY ROOM!!**  IF YOU WILL BE ADMITTED INTO THE HOSPITAL YOU ARE ALLOWED ONLY FOUR SUPPORT PEOPLE DURING VISITATION HOURS ONLY (7 AM -8PM)   The support person(s) must pass our screening, gel in and out, and wear a mask at all times, including in the patient's room. Patients must also wear a mask when staff or their support person are in the room. Visitors GUEST BADGE MUST BE WORN VISIBLY  One adult visitor may remain with you overnight and MUST be in the room by 8 P.M.     Your procedure is scheduled on: 07/04/23   Report to [facility] Main Entrance    Report to admitting at  10:20 AM   Call this number if you have problems the morning of surgery [phone]   Do not eat food :After Midnight.   After Midnight you may have the following liquids until __9:50____ AM/  DAY OF SURGERY  Water Black Coffee (sugar ok, NO MILK/CREAM OR CREAMERS)  Tea (sugar ok, NO MILK/CREAM OR CREAMERS) regular and decaf                             Plain Jell-O (NO RED)                                           Fruit ices (not with fruit pulp, NO RED)                                     Popsicles (NO RED)                                                                  Juice: apple, WHITE grape, WHITE cranberry Sports drinks like Gatorade (NO RED)                  The day of surgery:  Drink ONE G2 at 9:30 AM the morning of surgery. Drink in one sitting. Do not sip.  This drink was given to you during your hospital  pre-op appointment visit. Nothing else to drink after completing the  G2.at 9:50 AM          If you have questions, please contact your surgeon's office.     Oral Hygiene is also important to reduce your risk of infection.                                    Remember - BRUSH  YOUR TEETH THE MORNING OF SURGERY WITH YOUR REGULAR TOOTHPASTE  DENTURES WILL BE REMOVED PRIOR TO SURGERY PLEASE DO NOT APPLY "Poly grip" OR ADHESIVES!!!   Do NOT smoke after Midnight   Before surgery.Stop taking ___________on __________as instructed by _____________.  Stop taking ____________as directed by your Surgeon/Cardiologist.  Contact your Surgeon/Cardiologist for instructions on Anticoagulant Therapy prior to surgery.   Take these medicines the morning of surgery with A SIP OF WATER: Simvastatin- Zocor                                                                                                                            Atenolol-Tenormin                                                                                                                            Amlodipine                                                                                                                            Levothyroxine-Synthroid                                                                                                                            Pantoprazole-Protonix              Hold Januvia for 3 day prior to day of surgery. Last dose 1/4  DO NOT TAKE ANY ORAL DIABETIC MEDICATIONS DAY OF YOUR SURGERY  Bring CPAP mask and tubing day of surgery.  You may not have any metal on your body including hair pins, jewelry, and body piercing             Do not wear make-up, lotions, powders, perfumes/cologne, or deodorant  Do not wear nail polish including gel and S&S, artificial/acrylic nails, or any other type of covering on natural nails including finger and toenails. If you have artificial nails, gel coating, etc. that needs to be removed by a nail salon please have this removed prior to surgery or surgery may need to be canceled/ delayed if the surgeon/ anesthesia feels like they are unable to be safely monitored.   Do not shave  48 hours prior to surgery.      Do not bring valuables to the hospital. [place].   Contacts, glasses, or bridgework may not be worn into surgery.   Bring small overnight bag day of surgery.   DO NOT [place].     Patients discharged on the day of surgery will not be allowed to drive home.  Someone NEEDS to stay with you for the first 24 hours after anesthesia.   Special Instructions: Bring a copy of your healthcare power of attorney and living will documents the day of surgery if you haven't scanned them before.              Please read over the following fact sheets you were given: IF YOU HAVE QUESTIONS ABOUT YOUR PRE-OP INSTRUCTIONS PLEASE CALL [phone]    [facility] - Preparing for Surgery Before surgery, you can play an important role.  Because skin is not sterile, your skin needs to be as free of germs as possible.  You can reduce the number of germs on your skin by washing with CHG (chlorahexidine gluconate) soap before surgery.  CHG is an antiseptic cleaner which kills germs and bonds with the skin to continue killing germs even after washing. Please DO NOT use if you have an allergy to CHG or antibacterial soaps.  If your skin becomes reddened/irritated stop using the CHG and inform your nurse when you arrive at Short Stay. Do not shave (including legs and underarms) for at least 48 hours prior to the first CHG shower.   Please follow these instructions carefully:  1.  Shower with CHG Soap the night before surgery and the  morning of Surgery.  2.  If you choose to wash your hair, wash your hair first as usual with your  normal  shampoo.  3.  After you shampoo, rinse your hair and body thoroughly to remove the  shampoo.                            4.  Use CHG as you would any other liquid soap.  You can apply chg directly  to the skin and wash                       Gently with a scrungie or clean washcloth.  5.  Apply  the CHG Soap to your body ONLY FROM THE NECK DOWN.   Do not use on face/ open                           Wound or open sores. Avoid  contact with eyes, ears mouth and genitals (private parts).                       Wash face,  Genitals (private parts) with your normal soap.             6.  Wash thoroughly, paying special attention to the area where your surgery  will be performed.  7.  Thoroughly rinse your body with warm water from the neck down.  8.  DO NOT shower/wash with your normal soap after using and rinsing off  the CHG Soap.                9.  Pat yourself dry with a clean towel.            10.  Wear clean pajamas.            11.  Place clean sheets on your bed the night of your first shower and do not  sleep with pets. Day of Surgery : Do not apply any lotions/deodorants the morning of surgery.  Please wear clean clothes to the hospital/surgery center.  FAILURE TO FOLLOW THESE INSTRUCTIONS MAY RESULT IN THE CANCELLATION OF YOUR SURGERY   ________________________________________________________________________  Incentive Spirometer  An incentive spirometer is a tool that can help keep your lungs clear and active. This tool measures how well you are filling your lungs with each breath. Taking long deep breaths may help reverse or decrease the chance of developing breathing (pulmonary) problems (especially infection) following: A long period of time when you are unable to move or be active. BEFORE THE PROCEDURE  If the spirometer includes an indicator to show your best effort, your nurse or respiratory therapist will set it to a desired goal. If possible, sit up straight or lean slightly forward. Try not to slouch. Hold the incentive spirometer in an upright position. INSTRUCTIONS FOR USE  Sit on the edge of your bed if possible, or sit up as far as you can in bed or on a chair. Hold the incentive spirometer in an upright position. Breathe out normally. Place the mouthpiece in your  mouth and seal your lips tightly around it. Breathe in slowly and as deeply as possible, raising the piston or the ball toward the top of the column. Hold your breath for 3-5 seconds or for as long as possible. Allow the piston or ball to fall to the bottom of the column. Remove the mouthpiece from your mouth and breathe out normally. Rest for a few seconds and repeat Steps 1 through 7 at least 10 times every 1-2 hours when you are awake. Take your time and take a few normal breaths between deep breaths. The spirometer may include an indicator to show your best effort. Use the indicator as a goal to work toward during each repetition. After each set of 10 deep breaths, practice coughing to be sure your lungs are clear. If you have an incision (the cut made at the time of surgery), support your incision when coughing by placing a pillow or rolled up towels firmly against it. Once you are able to get out of bed, walk around indoors and cough well. You may stop using the incentive spirometer when instructed by your caregiver.  RISKS AND COMPLICATIONS Take your time so you do not get dizzy or light-headed. If you are in pain, you may need to take or ask for pain medication before doing incentive spirometry. It is harder  to take a deep breath if you are having pain. AFTER USE Rest and breathe slowly and easily. It can be helpful to keep track of a log of your progress. Your caregiver can provide you with a simple table to help with this. If you are using the spirometer at home, follow these instructions: [place] IF:  You are having difficultly using the spirometer. You have trouble using the spirometer as often as instructed. Your pain medication is not giving enough relief while using the spirometer. You develop fever of 100.5 F (38.1 C) or higher. SEEK IMMEDIATE MEDICAL CARE IF:  You cough up bloody sputum that had not been present before. You develop fever of 102 F (38.9 C) or  greater. You develop worsening pain at or near the incision site. MAKE SURE YOU:  Understand these instructions. Will watch your condition. Will get help right away if you are not doing well or get worse. Document Released: 10/23/2006 Document Revised: 09/04/2011 Document Reviewed: 12/24/2006 [facility] Patient Information 2014 [place], [place].   ________________________________________________________________________

## 2022-06-22 ENCOUNTER — Encounter (HOSPITAL_COMMUNITY)
Admission: RE | Admit: 2022-06-22 | Discharge: 2022-06-22 | Disposition: A | Discharge status: Home / Self Care | Payer: Medicare Other | Source: Ambulatory Visit | Attending: Orthopedic Surgery | Admitting: Orthopedic Surgery

## 2022-06-22 ENCOUNTER — Other Ambulatory Visit: Payer: Self-pay

## 2022-06-22 ENCOUNTER — Encounter (HOSPITAL_COMMUNITY): Payer: Self-pay

## 2022-06-22 VITALS — BP 161/80 | HR 62 | Temp 98.1°F | Resp 18 | Ht 68.0 in | Wt 260.0 lb

## 2022-06-22 DIAGNOSIS — E785 Hyperlipidemia, unspecified: Secondary | ICD-10-CM | POA: Insufficient documentation

## 2022-06-22 DIAGNOSIS — Z01818 Encounter for other preprocedural examination: Secondary | ICD-10-CM | POA: Diagnosis not present

## 2022-06-22 DIAGNOSIS — E1169 Type 2 diabetes mellitus with other specified complication: Secondary | ICD-10-CM | POA: Insufficient documentation

## 2022-06-22 LAB — BASIC METABOLIC PANEL
Anion gap: 8 (ref 5–15)
BUN: 17 mg/dL (ref 8–23)
CO2: 25 mmol/L (ref 22–32)
Calcium: 9.5 mg/dL (ref 8.9–10.3)
Chloride: 107 mmol/L (ref 98–111)
Creatinine, Ser: 0.75 mg/dL (ref 0.44–1.00)
GFR, Estimated: >60 mL/min (ref >60)
Glucose, Bld: 135 mg/dL — ABNORMAL HIGH (ref 70–99)
Potassium: 4.4 mmol/L (ref 3.5–5.1)
Sodium: 140 mmol/L (ref 135–145)

## 2022-06-22 LAB — CBC
HCT: 40.3 % (ref 36.0–46.0)
Hemoglobin: 12.7 g/dL (ref 12.0–15.0)
MCH: 28 pg (ref 26.0–34.0)
MCHC: 31.5 g/dL (ref 30.0–36.0)
MCV: 89 fL (ref 80.0–100.0)
Platelets: 309 10*3/uL (ref 150–400)
RBC: 4.53 MIL/uL (ref 3.87–5.11)
RDW: 15.9 % — ABNORMAL HIGH (ref 11.5–15.5)
WBC: 7.1 10*3/uL (ref 4.0–10.5)
nRBC: 0 % (ref 0.0–0.2)

## 2022-06-22 LAB — GLUCOSE, CAPILLARY: Glucose-Capillary: 135 mg/dL — ABNORMAL HIGH (ref 70–99)

## 2022-06-22 LAB — SURGICAL PCR SCREEN
MRSA, PCR: NEGATIVE
Staphylococcus aureus: NEGATIVE

## 2022-06-22 NOTE — Progress Notes (Signed)
Anesthesia note:  Bowel prep reminder:  NA  PCP - Dr. Alfonso Patten Polite Cardiologist -none Other-   Chest x-ray - no EKG - 06/22/22-chart Stress Test - no ECHO - no Cardiac Cath - no CABG-no Pacemaker/ICD device last checked:NA  Sleep Study - no CPAP -    CBG at PAT visit-135 Fasting Blood Sugar at home-90-113 Checks Blood Sugar _every other day____  Blood Thinner:ASA *! mg Blood Thinner Instructions: Aspirin Instructions:Stop 7 days prior to DOS Last Dose:12/31  Anesthesia review:No    Patient denies shortness of breath, fever, cough and chest pain at PAT appointment. Pt reports no SOB climbing stairs or with normal activities.   Patient verbalized understanding of instructions that were given to them at the PAT appointment. Patient was also instructed that they will need to review over the PAT instructions again at home before surgery.yes

## 2022-06-23 LAB — HEMOGLOBIN A1C
Hgb A1c MFr Bld: 6.2 % — ABNORMAL HIGH (ref 4.8–5.6)
Mean Plasma Glucose: 131 mg/dL

## 2022-07-03 ENCOUNTER — Ambulatory Visit (HOSPITAL_COMMUNITY)
Admission: RE | Admit: 2022-07-03 | Discharge: 2022-07-03 | Disposition: A | Discharge status: Home / Self Care | Payer: Medicare Other | Attending: Orthopedic Surgery | Admitting: Orthopedic Surgery

## 2022-07-03 ENCOUNTER — Other Ambulatory Visit: Payer: Self-pay

## 2022-07-03 ENCOUNTER — Encounter (HOSPITAL_COMMUNITY)
Admission: RE | Disposition: A | Discharge status: Home / Self Care | Payer: Self-pay | Source: Home / Self Care | Attending: Orthopedic Surgery

## 2022-07-03 ENCOUNTER — Ambulatory Visit (HOSPITAL_COMMUNITY): Payer: Medicare Other

## 2022-07-03 ENCOUNTER — Encounter (HOSPITAL_COMMUNITY): Payer: Self-pay | Admitting: Orthopedic Surgery

## 2022-07-03 ENCOUNTER — Ambulatory Visit (HOSPITAL_BASED_OUTPATIENT_CLINIC_OR_DEPARTMENT_OTHER): Payer: Medicare Other | Admitting: Anesthesiology

## 2022-07-03 ENCOUNTER — Ambulatory Visit (HOSPITAL_COMMUNITY): Payer: Medicare Other | Admitting: Anesthesiology

## 2022-07-03 DIAGNOSIS — M1712 Unilateral primary osteoarthritis, left knee: Secondary | ICD-10-CM

## 2022-07-03 DIAGNOSIS — K219 Gastro-esophageal reflux disease without esophagitis: Secondary | ICD-10-CM | POA: Diagnosis not present

## 2022-07-03 DIAGNOSIS — Z87891 Personal history of nicotine dependence: Secondary | ICD-10-CM | POA: Diagnosis not present

## 2022-07-03 DIAGNOSIS — M21062 Valgus deformity, not elsewhere classified, left knee: Secondary | ICD-10-CM

## 2022-07-03 DIAGNOSIS — M25662 Stiffness of left knee, not elsewhere classified: Secondary | ICD-10-CM | POA: Diagnosis not present

## 2022-07-03 DIAGNOSIS — I10 Essential (primary) hypertension: Secondary | ICD-10-CM | POA: Insufficient documentation

## 2022-07-03 DIAGNOSIS — G8918 Other acute postprocedural pain: Secondary | ICD-10-CM | POA: Diagnosis not present

## 2022-07-03 DIAGNOSIS — E1169 Type 2 diabetes mellitus with other specified complication: Secondary | ICD-10-CM

## 2022-07-03 DIAGNOSIS — Z833 Family history of diabetes mellitus: Secondary | ICD-10-CM | POA: Diagnosis not present

## 2022-07-03 DIAGNOSIS — E039 Hypothyroidism, unspecified: Secondary | ICD-10-CM | POA: Diagnosis not present

## 2022-07-03 DIAGNOSIS — E119 Type 2 diabetes mellitus without complications: Secondary | ICD-10-CM | POA: Diagnosis not present

## 2022-07-03 DIAGNOSIS — Z471 Aftercare following joint replacement surgery: Secondary | ICD-10-CM | POA: Diagnosis not present

## 2022-07-03 DIAGNOSIS — Z96652 Presence of left artificial knee joint: Secondary | ICD-10-CM | POA: Diagnosis not present

## 2022-07-03 HISTORY — PX: TOTAL KNEE ARTHROPLASTY: SHX125

## 2022-07-03 LAB — GLUCOSE, CAPILLARY
Glucose-Capillary: 125 mg/dL — ABNORMAL HIGH (ref 70–99)
Glucose-Capillary: 144 mg/dL — ABNORMAL HIGH (ref 70–99)

## 2022-07-03 SURGERY — ARTHROPLASTY, KNEE, TOTAL
Anesthesia: Spinal | Site: Knee | Laterality: Left

## 2022-07-03 MED ORDER — BUPIVACAINE LIPOSOME 1.3 % IJ SUSP: 20.0000 mL | Freq: Once | INTRAMUSCULAR | Status: DC

## 2022-07-03 MED ORDER — ACETAMINOPHEN 500 MG PO TABS: 1000.0000 mg | ORAL_TABLET | Freq: Once | ORAL | Status: DC

## 2022-07-03 MED ORDER — LACTATED RINGERS IV BOLUS
500.0000 mL | Freq: Once | INTRAVENOUS | Status: AC
  Administered 2022-07-03: 500 mL via INTRAVENOUS

## 2022-07-03 MED ORDER — EPHEDRINE SULFATE-NACL 50-0.9 MG/10ML-% IV SOSY
PREFILLED_SYRINGE | INTRAVENOUS | Status: DC | PRN
  Administered 2022-07-03 (×2): 7.5 mg via INTRAVENOUS
  Administered 2022-07-03: 5 mg via INTRAVENOUS

## 2022-07-03 MED ORDER — CHLORHEXIDINE GLUCONATE 0.12 % MT SOLN
15.0000 mL | Freq: Once | OROMUCOSAL | Status: AC
  Administered 2022-07-03: 15 mL via OROMUCOSAL

## 2022-07-03 MED ORDER — PROMETHAZINE HCL 25 MG/ML IJ SOLN: 6.2500 mg | INTRAMUSCULAR | Status: DC | PRN

## 2022-07-03 MED ORDER — PROPOFOL 1000 MG/100ML IV EMUL
INTRAVENOUS | Status: AC
  Filled 2022-07-03: qty 100

## 2022-07-03 MED ORDER — DEXAMETHASONE SODIUM PHOSPHATE 10 MG/ML IJ SOLN
INTRAMUSCULAR | Status: AC
  Filled 2022-07-03: qty 1

## 2022-07-03 MED ORDER — PROPOFOL 500 MG/50ML IV EMUL
INTRAVENOUS | Status: AC
  Filled 2022-07-03: qty 50

## 2022-07-03 MED ORDER — METHOCARBAMOL 500 MG IVPB - SIMPLE MED
500.0000 mg | Freq: Four times a day (QID) | INTRAVENOUS | Status: DC | PRN
  Administered 2022-07-03: 500 mg via INTRAVENOUS

## 2022-07-03 MED ORDER — DEXAMETHASONE SODIUM PHOSPHATE 10 MG/ML IJ SOLN
INTRAMUSCULAR | Status: DC | PRN
  Administered 2022-07-03: 5 mg

## 2022-07-03 MED ORDER — FENTANYL CITRATE PF 50 MCG/ML IJ SOSY
50.0000 ug | PREFILLED_SYRINGE | Freq: Once | INTRAMUSCULAR | Status: AC
  Administered 2022-07-03: 100 ug via INTRAVENOUS
  Filled 2022-07-03: qty 2

## 2022-07-03 MED ORDER — BUPIVACAINE LIPOSOME 1.3 % IJ SUSP
INTRAMUSCULAR | Status: DC | PRN
  Administered 2022-07-03: 20 mL

## 2022-07-03 MED ORDER — BUPIVACAINE LIPOSOME 1.3 % IJ SUSP
INTRAMUSCULAR | Status: AC
  Filled 2022-07-03: qty 20

## 2022-07-03 MED ORDER — FENTANYL CITRATE (PF) 250 MCG/5ML IJ SOLN
INTRAMUSCULAR | Status: DC | PRN
  Administered 2022-07-03 (×4): 25 ug via INTRAVENOUS

## 2022-07-03 MED ORDER — LIDOCAINE HCL (PF) 2 % IJ SOLN
INTRAMUSCULAR | Status: AC
  Filled 2022-07-03: qty 5

## 2022-07-03 MED ORDER — ROPIVACAINE HCL 7.5 MG/ML IJ SOLN
INTRAMUSCULAR | Status: DC | PRN
  Administered 2022-07-03: 20 mL via PERINEURAL

## 2022-07-03 MED ORDER — METHOCARBAMOL 500 MG PO TABS: 500.0000 mg | ORAL_TABLET | Freq: Four times a day (QID) | ORAL | Status: DC | PRN

## 2022-07-03 MED ORDER — ONDANSETRON HCL 4 MG/2ML IJ SOLN
INTRAMUSCULAR | Status: DC | PRN
  Administered 2022-07-03: 4 mg via INTRAVENOUS

## 2022-07-03 MED ORDER — ACETAMINOPHEN 500 MG PO TABS
1000.0000 mg | ORAL_TABLET | Freq: Once | ORAL | Status: AC
  Administered 2022-07-03: 1000 mg via ORAL
  Filled 2022-07-03: qty 2

## 2022-07-03 MED ORDER — FENTANYL CITRATE (PF) 100 MCG/2ML IJ SOLN
INTRAMUSCULAR | Status: AC
  Filled 2022-07-03: qty 2

## 2022-07-03 MED ORDER — PROPOFOL 10 MG/ML IV BOLUS
INTRAVENOUS | Status: AC
  Filled 2022-07-03: qty 20

## 2022-07-03 MED ORDER — PROPOFOL 500 MG/50ML IV EMUL
INTRAVENOUS | Status: DC | PRN
  Administered 2022-07-03: 100 ug/kg/min via INTRAVENOUS

## 2022-07-03 MED ORDER — HYDRALAZINE HCL 20 MG/ML IJ SOLN
INTRAMUSCULAR | Status: AC
  Filled 2022-07-03: qty 1

## 2022-07-03 MED ORDER — OXYCODONE HCL 5 MG PO TABS: 5.0000 mg | ORAL_TABLET | ORAL | 0 refills | Status: AC | PRN

## 2022-07-03 MED ORDER — CELECOXIB 200 MG PO CAPS
400.0000 mg | ORAL_CAPSULE | Freq: Once | ORAL | Status: AC
  Administered 2022-07-03: 400 mg via ORAL
  Filled 2022-07-03: qty 2

## 2022-07-03 MED ORDER — ONDANSETRON HCL 4 MG/2ML IJ SOLN
INTRAMUSCULAR | Status: AC
  Filled 2022-07-03: qty 2

## 2022-07-03 MED ORDER — KETOROLAC TROMETHAMINE 15 MG/ML IJ SOLN: 7.5000 mg | Freq: Four times a day (QID) | INTRAMUSCULAR | Status: DC

## 2022-07-03 MED ORDER — METHOCARBAMOL 500 MG IVPB - SIMPLE MED
INTRAVENOUS | Status: AC
  Filled 2022-07-03: qty 55

## 2022-07-03 MED ORDER — LACTATED RINGERS IV BOLUS: 250.0000 mL | Freq: Once | INTRAVENOUS | Status: DC

## 2022-07-03 MED ORDER — DEXAMETHASONE SODIUM PHOSPHATE 10 MG/ML IJ SOLN
INTRAMUSCULAR | Status: DC | PRN
  Administered 2022-07-03: 8 mg via INTRAVENOUS

## 2022-07-03 MED ORDER — CELECOXIB 100 MG PO CAPS: 100.0000 mg | ORAL_CAPSULE | Freq: Two times a day (BID) | ORAL | 0 refills | Status: AC

## 2022-07-03 MED ORDER — POVIDONE-IODINE 7.5 % EX SOLN: Freq: Once | CUTANEOUS | Status: DC

## 2022-07-03 MED ORDER — LACTATED RINGERS IV BOLUS
250.0000 mL | Freq: Once | INTRAVENOUS | Status: AC
  Administered 2022-07-03: 250 mL via INTRAVENOUS

## 2022-07-03 MED ORDER — MIDAZOLAM HCL 2 MG/2ML IJ SOLN
1.0000 mg | Freq: Once | INTRAMUSCULAR | Status: AC
  Administered 2022-07-03: 2 mg via INTRAVENOUS
  Filled 2022-07-03: qty 2

## 2022-07-03 MED ORDER — METHOCARBAMOL 500 MG PO TABS: 500.0000 mg | ORAL_TABLET | Freq: Three times a day (TID) | ORAL | 0 refills | Status: AC | PRN

## 2022-07-03 MED ORDER — POVIDONE-IODINE 10 % EX SWAB
2.0000 | Freq: Once | CUTANEOUS | Status: AC
  Administered 2022-07-03: 2 via TOPICAL

## 2022-07-03 MED ORDER — SODIUM CHLORIDE (PF) 0.9 % IJ SOLN
INTRAMUSCULAR | Status: AC
  Filled 2022-07-03: qty 10

## 2022-07-03 MED ORDER — ISOPROPYL ALCOHOL 70 % SOLN
Status: DC | PRN
  Administered 2022-07-03: 1 via TOPICAL

## 2022-07-03 MED ORDER — HYDRALAZINE HCL 20 MG/ML IJ SOLN
INTRAMUSCULAR | Status: DC | PRN
  Administered 2022-07-03: 5 mg via INTRAVENOUS

## 2022-07-03 MED ORDER — CEFAZOLIN SODIUM-DEXTROSE 2-4 GM/100ML-% IV SOLN
2.0000 g | INTRAVENOUS | Status: AC
  Administered 2022-07-03: 2 g via INTRAVENOUS
  Filled 2022-07-03: qty 100

## 2022-07-03 MED ORDER — METOPROLOL TARTRATE 5 MG/5ML IV SOLN
INTRAVENOUS | Status: AC
  Filled 2022-07-03: qty 5

## 2022-07-03 MED ORDER — APIXABAN 2.5 MG PO TABS: 2.5000 mg | ORAL_TABLET | Freq: Two times a day (BID) | ORAL | 0 refills | Status: DC

## 2022-07-03 MED ORDER — GLYCOPYRROLATE 0.2 MG/ML IJ SOLN
INTRAMUSCULAR | Status: AC
  Filled 2022-07-03: qty 1

## 2022-07-03 MED ORDER — METOPROLOL TARTRATE 5 MG/5ML IV SOLN
INTRAVENOUS | Status: DC | PRN
  Administered 2022-07-03: .5 mg via INTRAVENOUS
  Administered 2022-07-03: 1 mg via INTRAVENOUS
  Administered 2022-07-03: .5 mg via INTRAVENOUS

## 2022-07-03 MED ORDER — FENTANYL CITRATE PF 50 MCG/ML IJ SOSY: 25.0000 ug | PREFILLED_SYRINGE | INTRAMUSCULAR | Status: DC | PRN

## 2022-07-03 MED ORDER — 0.9 % SODIUM CHLORIDE (POUR BTL) OPTIME
TOPICAL | Status: DC | PRN
  Administered 2022-07-03: 1000 mL

## 2022-07-03 MED ORDER — TRANEXAMIC ACID-NACL 1000-0.7 MG/100ML-% IV SOLN
1000.0000 mg | INTRAVENOUS | Status: AC
  Administered 2022-07-03: 1000 mg via INTRAVENOUS
  Filled 2022-07-03: qty 100

## 2022-07-03 MED ORDER — SODIUM CHLORIDE 0.9% FLUSH
INTRAVENOUS | Status: DC | PRN
  Administered 2022-07-03: 30 mL

## 2022-07-03 MED ORDER — CEFAZOLIN SODIUM-DEXTROSE 2-4 GM/100ML-% IV SOLN: 2.0000 g | Freq: Four times a day (QID) | INTRAVENOUS | Status: DC

## 2022-07-03 MED ORDER — SODIUM CHLORIDE 0.9 % IR SOLN
Status: DC | PRN
  Administered 2022-07-03: 3000 mL

## 2022-07-03 MED ORDER — ONDANSETRON HCL 4 MG PO TABS: 4.0000 mg | ORAL_TABLET | Freq: Three times a day (TID) | ORAL | 0 refills | Status: AC | PRN

## 2022-07-03 MED ORDER — POVIDONE-IODINE 10 % EX SWAB: 2.0000 | Freq: Once | CUTANEOUS | Status: DC

## 2022-07-03 MED ORDER — PHENYLEPHRINE 80 MCG/ML (10ML) SYRINGE FOR IV PUSH (FOR BLOOD PRESSURE SUPPORT)
PREFILLED_SYRINGE | INTRAVENOUS | Status: DC | PRN
  Administered 2022-07-03 (×4): 80 ug via INTRAVENOUS

## 2022-07-03 MED ORDER — PROPOFOL 10 MG/ML IV BOLUS
INTRAVENOUS | Status: DC | PRN
  Administered 2022-07-03: 10 mg via INTRAVENOUS
  Administered 2022-07-03: 20 mg via INTRAVENOUS
  Administered 2022-07-03: 30 mg via INTRAVENOUS
  Administered 2022-07-03 (×2): 20 mg via INTRAVENOUS

## 2022-07-03 MED ORDER — LACTATED RINGERS IV SOLN: INTRAVENOUS | Status: DC

## 2022-07-03 MED ORDER — ORAL CARE MOUTH RINSE: 15.0000 mL | Freq: Once | OROMUCOSAL | Status: AC

## 2022-07-03 MED ORDER — EPHEDRINE 5 MG/ML INJ
INTRAVENOUS | Status: AC
  Filled 2022-07-03: qty 5

## 2022-07-03 MED ORDER — GLYCOPYRROLATE 0.2 MG/ML IJ SOLN
INTRAMUSCULAR | Status: DC | PRN
  Administered 2022-07-03 (×2): .05 mg via INTRAVENOUS

## 2022-07-03 MED ORDER — BUPIVACAINE IN DEXTROSE 0.75-8.25 % IT SOLN
INTRATHECAL | Status: DC | PRN
  Administered 2022-07-03: 1.8 mL via INTRATHECAL

## 2022-07-03 MED ORDER — WATER FOR IRRIGATION, STERILE IR SOLN
Status: DC | PRN
  Administered 2022-07-03: 2000 mL

## 2022-07-03 MED ORDER — SODIUM CHLORIDE (PF) 0.9 % IJ SOLN
INTRAMUSCULAR | Status: AC
  Filled 2022-07-03: qty 50

## 2022-07-03 SURGICAL SUPPLY — 67 items
ADH SKN CLS APL DERMABOND .7 (GAUZE/BANDAGES/DRESSINGS) ×1
APL PRP STRL LF DISP 70% ISPRP (MISCELLANEOUS) ×2
BAG COUNTER SPONGE SURGICOUNT (BAG) IMPLANT
BAG SPNG CNTER NS LX DISP (BAG) ×1
BLADE SAG 18X100X1.27 (BLADE) ×1 IMPLANT
BLADE SAW SAG 35X64 .89 (BLADE) ×1 IMPLANT
BLADE SAW SGTL 11.0X1.19X90.0M (BLADE) IMPLANT
BNDG CMPR 5X3 CHSV STRCH STRL (GAUZE/BANDAGES/DRESSINGS) ×1
BNDG CMPR MED 10X6 ELC LF (GAUZE/BANDAGES/DRESSINGS) ×1
BNDG COHESIVE 3X5 TAN ST LF (GAUZE/BANDAGES/DRESSINGS) ×1 IMPLANT
BNDG ELASTIC 6X10 VLCR STRL LF (GAUZE/BANDAGES/DRESSINGS) ×1 IMPLANT
BOWL SMART MIX CTS (DISPOSABLE) ×1 IMPLANT
BSPLAT TIB 5D F CMNT KN LT (Knees) ×1 IMPLANT
CEMENT BONE REFOBACIN R1X40 US (Cement) IMPLANT
CHLORAPREP W/TINT 26 (MISCELLANEOUS) ×2 IMPLANT
COMP FEM PERSONA SZ9 LT (Joint) ×1 IMPLANT
COMPONENT FEM PERSONA SZ9 LT (Joint) IMPLANT
COVER SURGICAL LIGHT HANDLE (MISCELLANEOUS) ×1 IMPLANT
CUFF TOURN SGL QUICK 34 (TOURNIQUET CUFF) ×1
CUFF TRNQT CYL 34X4.125X (TOURNIQUET CUFF) ×1 IMPLANT
DERMABOND ADVANCED .7 DNX12 (GAUZE/BANDAGES/DRESSINGS) ×1 IMPLANT
DRAPE INCISE IOBAN 85X60 (DRAPES) ×1 IMPLANT
DRAPE SHEET LG 3/4 BI-LAMINATE (DRAPES) ×1 IMPLANT
DRAPE U-SHAPE 47X51 STRL (DRAPES) ×1 IMPLANT
DRESSING AQUACEL AG SP 3.5X10 (GAUZE/BANDAGES/DRESSINGS) ×1 IMPLANT
DRSG AQUACEL AG SP 3.5X10 (GAUZE/BANDAGES/DRESSINGS) ×1
ELECT REM PT RETURN 15FT ADLT (MISCELLANEOUS) ×1 IMPLANT
GAUZE SPONGE 4X4 12PLY STRL (GAUZE/BANDAGES/DRESSINGS) ×1 IMPLANT
GLOVE BIO SURGEON STRL SZ 6.5 (GLOVE) ×2 IMPLANT
GLOVE BIOGEL PI IND STRL 6.5 (GLOVE) ×1 IMPLANT
GLOVE BIOGEL PI IND STRL 8 (GLOVE) ×1 IMPLANT
GLOVE SURG ORTHO 8.0 STRL STRW (GLOVE) ×2 IMPLANT
GOWN STRL REUS W/ TWL XL LVL3 (GOWN DISPOSABLE) ×2 IMPLANT
GOWN STRL REUS W/TWL XL LVL3 (GOWN DISPOSABLE) ×2
HANDPIECE INTERPULSE COAX TIP (DISPOSABLE) ×1
HDLS TROCR DRIL PIN KNEE 75 (PIN) ×1
HOLDER FOLEY CATH W/STRAP (MISCELLANEOUS) ×1 IMPLANT
HOOD PEEL AWAY T7 (MISCELLANEOUS) ×3 IMPLANT
MANIFOLD NEPTUNE II (INSTRUMENTS) ×1 IMPLANT
MARKER SKIN DUAL TIP RULER LAB (MISCELLANEOUS) ×1 IMPLANT
NS IRRIG 1000ML POUR BTL (IV SOLUTION) ×1 IMPLANT
PACK TOTAL KNEE CUSTOM (KITS) ×1 IMPLANT
PIN DRILL HDLS TROCAR 75 4PK (PIN) IMPLANT
PROTECTOR NERVE ULNAR (MISCELLANEOUS) ×1 IMPLANT
PSN ASF CPS VE L 6-9EF 10 (Joint) ×1 IMPLANT
SCREW HEADED 33MM KNEE (MISCELLANEOUS) IMPLANT
SET HNDPC FAN SPRY TIP SCT (DISPOSABLE) ×1 IMPLANT
SOLUTION IRRIG SURGIPHOR (IV SOLUTION) IMPLANT
SPIKE FLUID TRANSFER (MISCELLANEOUS) ×1 IMPLANT
STEM POLY PAT PLY 35M KNEE (Knees) IMPLANT
STEM TIB ST PERS 14+30 (Stem) IMPLANT
STEM TIBIA 5 DEG SZ F L KNEE (Knees) IMPLANT
STRIP CLOSURE SKIN 1/2X4 (GAUZE/BANDAGES/DRESSINGS) ×1 IMPLANT
SURFACE ARTC PRSNA CPS6-9EF 10 (Joint) IMPLANT
SUT MNCRL AB 3-0 PS2 18 (SUTURE) ×1 IMPLANT
SUT STRATAFIX 0 PDS 27 VIOLET (SUTURE) ×1
SUT STRATAFIX PDO 1 14 VIOLET (SUTURE) ×1
SUT STRATFX PDO 1 14 VIOLET (SUTURE) ×1
SUT VIC AB 2-0 CT2 27 (SUTURE) ×2 IMPLANT
SUTURE STRATFX 0 PDS 27 VIOLET (SUTURE) ×1 IMPLANT
SUTURE STRATFX PDO 1 14 VIOLET (SUTURE) ×1 IMPLANT
SYR 50ML LL SCALE MARK (SYRINGE) ×1 IMPLANT
TIBIA STEM 5 DEG SZ F L KNEE (Knees) ×1 IMPLANT
TRAY FOLEY MTR SLVR 14FR STAT (SET/KITS/TRAYS/PACK) IMPLANT
TUBE SUCTION HIGH CAP CLEAR NV (SUCTIONS) ×1 IMPLANT
UNDERPAD 30X36 HEAVY ABSORB (UNDERPADS AND DIAPERS) ×1 IMPLANT
WRAP KNEE MAXI GEL POST OP (GAUZE/BANDAGES/DRESSINGS) IMPLANT

## 2022-07-03 NOTE — Progress Notes (Signed)
Orthopedic Tech Progress Note Patient Details:  Sara [name] September 30, 1955 [id number]  Patient ID: Sara [name], female   DOB: 12/30/1955, 67 y.o.   MRN: [id number]  [name] 07/03/2022, 12:39 PM Left bone foam applied in pacu

## 2022-07-03 NOTE — Transfer of Care (Signed)
Immediate Anesthesia Transfer of Care Note  Patient: Sara [name]  Procedure(s) Performed: Procedure(s): TOTAL KNEE ARTHROPLASTY (Left)  Patient Location: PACU  Anesthesia Type:MAC, Regional, and Spinal  Level of Consciousness: Patient easily awoken, sedated, comfortable, cooperative, following commands, responds to stimulation.   Airway & Oxygen Therapy: Patient spontaneously breathing, ventilating well, oxygen via simple oxygen mask.  Post-op Assessment: Report given to PACU RN, vital signs reviewed and stable, moving all extremities.   Post vital signs: Reviewed and stable.  Complications: No apparent anesthesia complications  Last Vitals:  Vitals Value Taken Time  BP 162/86 07/03/22 1240  Temp    Pulse 64 07/03/22 1241  Resp 14 07/03/22 1241  SpO2 91 % 07/03/22 1241  Vitals shown include unvalidated device data.  Last Pain:  Vitals:   07/03/22 0920  TempSrc:   PainSc: 0-No pain         Complications: No notable events documented.

## 2022-07-03 NOTE — Anesthesia Procedure Notes (Signed)
Procedure Name: MAC Date/Time: 07/03/2022 9:51 AM  Performed by: Deliah Boston, CRNAPre-anesthesia Checklist: Patient identified, Emergency Drugs available, Suction available and Patient being monitored Patient Re-evaluated:Patient Re-evaluated prior to induction Oxygen Delivery Method: Simple face mask Placement Confirmation: positive ETCO2 and breath sounds checked- equal and bilateral Dental Injury: Teeth and Oropharynx as per pre-operative assessment

## 2022-07-03 NOTE — Care Plan (Signed)
Ortho Bundle Case Management Note  Patient Details  Name: Sara [name] MRN: [id number] Date of Birth: [birth year]  patient seen in the office by PA for H&P. will discharge to home with family to assist. rolling walker ordered for home use. OPP[facility] up with [facility]. discharge instructions discussed and questions answered. CM spoke with her today on phone. no follow up needs. Patient and MD in agreement. Choice offered              DME Arranged:  Walker rolling DME Agency:  [employer]  HH Arranged:    [place] Agency:     Additional Comments: Please contact me with any questions of if this plan should need to change.  Ladell Heads,  [place] Specialist  [phone] 07/03/2022, 10:40 AM

## 2022-07-03 NOTE — Anesthesia Procedure Notes (Signed)
Anesthesia Regional Block: Adductor canal block   Pre-Anesthetic Checklist: , timeout performed,  Correct Patient, Correct Site, Correct Laterality,  Correct Procedure, Correct Position, site marked,  Risks and benefits discussed,  Surgical consent,  Pre-op evaluation,  At surgeon's request and post-op pain management  Laterality: Left  Prep: chloraprep       Needles:  Injection technique: Single-shot  Needle Type: Stimulator Needle - 80     Needle Length: 10cm  Needle Gauge: 21     Additional Needles:   Narrative:  Start time: 07/03/2022 9:07 AM End time: 07/03/2022 9:17 AM Injection made incrementally with aspirations every 5 mL.  Performed by: Personally  Anesthesiologist: Duane Boston, MD

## 2022-07-03 NOTE — Evaluation (Signed)
Physical Therapy Evaluation Patient Details Name: ICESS [name] MRN: [id number] DOB: 1955/09/12 Today's Date: 07/03/2022  History of Present Illness  Pt is a 67yo female presenting s/p L-TKA on 07/03/22. PMH: HTN, hypothyroidism, DM.  Clinical Impression  Sara [name] is a 67 y.o. female POD 0 s/p L-TKA. Patient reports independence with mobility at baseline. Patient is now limited by functional impairments (see PT problem list below) and requires min guard for transfers and gait with RW. Patient was able to ambulate 80 feet with RW and min guard and cues for safe walker management. Patient educated on safe sequencing for stair mobility and verbalized safe guarding position for people assisting with mobility. Patient instructed in exercises to facilitate ROM and circulation. Patient will benefit from continued skilled PT interventions to address impairments and progress towards PLOF. Patient has met mobility goals at adequate level for discharge home; will continue to follow if pt continues acute stay to progress towards Mod I goals.       Recommendations for follow up therapy are one component of a multi-disciplinary discharge planning process, led by the attending physician.  Recommendations may be updated based on patient status, additional functional criteria and insurance authorization.  Follow Up Recommendations Follow physician's recommendations for discharge plan and follow up therapies      Assistance Recommended at Discharge Frequent or constant Supervision/Assistance  Patient can return home with the following  A little help with walking and/or transfers;A little help with bathing/dressing/bathroom;Assistance with cooking/housework;Assist for transportation;Help with stairs or ramp for entrance    Equipment Recommendations None recommended by PT  Recommendations for Other Services       Functional Status Assessment Patient has had a recent decline in their functional status  and demonstrates the ability to make significant improvements in function in a reasonable and predictable amount of time.     Precautions / Restrictions Precautions Precautions: Knee Precaution Booklet Issued: No Precaution Comments: No pillow under the knee Restrictions Weight Bearing Restrictions: No Other Position/Activity Restrictions: WBAT      Mobility  Bed Mobility Overal bed mobility: Modified Independent             General bed mobility comments: increased time    Transfers Overall transfer level: Needs assistance Equipment used: Rolling walker (2 wheels) Transfers: Sit to/from Stand Sit to Stand: Min guard, From elevated surface           General transfer comment: For safety only, VCs for hand placement and powering up from bed with BUE and RLE    Ambulation/Gait Ambulation/Gait assistance: Min guard Gait Distance (Feet): 80 Feet Assistive device: Rolling walker (2 wheels) Gait Pattern/deviations: Step-to pattern, Step-through pattern Gait velocity: decreased     General Gait Details: Pt ambulated with RW and min guard, no physical assist required or overt LOB noted, VCs for upright posture and proximity to device.  Stairs            Wheelchair Mobility    Modified Rankin (Stroke Patients Only)       Balance Overall balance assessment: Needs assistance Sitting-balance support: Feet supported, No upper extremity supported Sitting balance-Leahy Scale: Good     Standing balance support: Reliant on assistive device for balance, During functional activity, Bilateral upper extremity supported Standing balance-Leahy Scale: Poor                               Pertinent Vitals/Pain Pain Assessment Pain Assessment: No/denies  pain    Home Living Family/patient expects to be discharged to:: Private residence Living Arrangements: Spouse/significant other Available Help at Discharge: Family;Available 24 hours/day Type of Home:  House Home Access: Stairs to enter Entrance Stairs-Rails: None Entrance Stairs-Number of Steps: 3   Home Layout: Two level;Able to live on main level with bedroom/bathroom Home Equipment: Shower seat - built Medical sales representative (2 wheels);Cane - single point      Prior Function Prior Level of Function : Independent/Modified Independent;Working/employed (Retired Animal nutritionist)             Mobility Comments: IND ADLs Comments: IND     Journalist, newspaper        Extremity/Trunk Assessment   Upper Extremity Assessment Upper Extremity Assessment: Overall WFL for tasks assessed    Lower Extremity Assessment Lower Extremity Assessment: RLE deficits/detail;LLE deficits/detail RLE Deficits / Details: MMT ank DF/PF 5/5 RLE Sensation: WNL LLE Deficits / Details: MMT ank DF/PF 5/5, no extensor lag noted LLE Sensation: WNL    Cervical / Trunk Assessment Cervical / Trunk Assessment: Normal  Communication   Communication: No difficulties  Cognition Arousal/Alertness: Awake/alert Behavior During Therapy: WFL for tasks assessed/performed Overall Cognitive Status: Within Functional Limits for tasks assessed                                          General Comments General comments (skin integrity, edema, etc.): Daughter present    Exercises Total Joint Exercises Ankle Circles/Pumps: AROM, Both, 10 reps Quad Sets: AROM, Left, Other reps (comment) (2) Short Arc Quad: AROM, Left, Other reps (comment) (2) Heel Slides: AROM, Left, Other reps (comment) (3) Hip ABduction/ADduction: AROM, Left, 5 reps Straight Leg Raises: AROM, Left, 5 reps Goniometric ROM: -5-90deg by gross visual approximation   Assessment/Plan    PT Assessment Patient needs continued PT services  PT Problem List Decreased strength;Decreased range of motion;Decreased activity tolerance;Decreased balance;Decreased mobility;Pain       PT Treatment Interventions DME instruction;Gait  training;Stair training;Functional mobility training;Therapeutic activities;Therapeutic exercise;Balance training;Patient/family education;Neuromuscular re-education    PT Goals (Current goals can be found in the Care Plan section)  Acute Rehab PT Goals Patient Stated Goal: Walk through [facility] PT Goal Formulation: With patient Time For Goal Achievement: 07/10/22 Potential to Achieve Goals: Good    Frequency 7X/week     Co-evaluation               AM-PAC PT "6 Clicks" Mobility  Outcome Measure Help needed turning from your back to your side while in a flat bed without using bedrails?: None Help needed moving from lying on your back to sitting on the side of a flat bed without using bedrails?: A Little Help needed moving to and from a bed to a chair (including a wheelchair)?: A Little Help needed standing up from a chair using your arms (e.g., wheelchair or bedside chair)?: A Little Help needed to walk in hospital room?: A Little Help needed climbing 3-5 steps with a railing? : A Little 6 Click Score: 19    End of Session Equipment Utilized During Treatment: Gait belt Activity Tolerance: Patient tolerated treatment well;No increased pain Patient left: in chair;with call bell/phone within reach Nurse Communication: Mobility status PT Visit Diagnosis: Pain;Difficulty in walking, not elsewhere classified (R26.2) Pain - Right/Left: Left Pain - part of body: Knee    Time: 0814-4818 PT Time Calculation (min) (ACUTE ONLY): 31 min  Charges:   PT Evaluation $PT Eval Low Complexity: 1 Low PT Treatments $Gait Training: 8-22 mins        Coolidge Breeze, PT, DPT [facility] Rehabilitation Department Office: [phone] Weekend pager: [phone]  Coolidge Breeze 07/03/2022, 4:01 PM

## 2022-07-03 NOTE — Anesthesia Preprocedure Evaluation (Addendum)
Anesthesia Evaluation  Patient identified by MRN, date of birth, ID band Patient awake    Reviewed: Allergy & Precautions, NPO status , Patient's Chart, lab work & pertinent test results, reviewed documented beta blocker date and time   History of Anesthesia Complications Negative for: history of anesthetic complications  Airway Mallampati: II  TM Distance: >3 FB Neck ROM: Full    Dental  (+) Teeth Intact, Dental Advisory Given   Pulmonary former smoker   Pulmonary exam normal breath sounds clear to auscultation       Cardiovascular hypertension, Pt. on medications and Pt. on home beta blockers Normal cardiovascular exam Rhythm:Regular Rate:Normal     Neuro/Psych negative neurological ROS     GI/Hepatic Neg liver ROS,GERD  Medicated,,  Endo/Other  diabetes, Type 2, Oral Hypoglycemic AgentsHypothyroidism    Renal/GU negative Renal ROS     Musculoskeletal  (+) Arthritis , Osteoarthritis,    Abdominal   Peds  Hematology negative hematology ROS (+) Plt 309k   Anesthesia Other Findings Day of surgery medications reviewed with the patient.  Reproductive/Obstetrics                             Anesthesia Physical Anesthesia Plan  ASA: 2  Anesthesia Plan: Spinal   Post-op Pain Management: Regional block* and Tylenol PO (pre-op)*   Induction: Intravenous  PONV Risk Score and Plan: 2 and TIVA and Treatment may vary due to age or medical condition  Airway Management Planned: Natural Airway and Simple Face Mask  Additional Equipment:   Intra-op Plan:   Post-operative Plan:   Informed Consent: I have reviewed the patients History and Physical, chart, labs and discussed the procedure including the risks, benefits and alternatives for the proposed anesthesia with the patient or authorized representative who has indicated his/her understanding and acceptance.     Dental advisory  given  Plan Discussed with: CRNA, Anesthesiologist and Surgeon  Anesthesia Plan Comments:        Anesthesia Quick Evaluation

## 2022-07-03 NOTE — Interval H&P Note (Signed)

## 2022-07-03 NOTE — Anesthesia Procedure Notes (Signed)
Spinal  Patient location during procedure: OR Start time: 07/03/2022 9:51 AM End time: 07/03/2022 10:03 AM Reason for block: surgical anesthesia Staffing Performed: anesthesiologist  Anesthesiologist: Duane Boston, MD Performed by: Duane Boston, MD Authorized by: Duane Boston, MD   Preanesthetic Checklist Completed: patient identified, IV checked, risks and benefits discussed, surgical consent, monitors and equipment checked, pre-op evaluation and timeout performed Spinal Block Patient position: sitting Prep: DuraPrep Patient monitoring: cardiac monitor, continuous pulse ox and blood pressure Approach: midline Location: L2-3 Injection technique: single-shot Needle Needle type: Pencan  Needle gauge: 24 G Needle length: 9 cm Assessment Events: CSF return Additional Notes Functioning IV was confirmed and monitors were applied. Sterile prep and drape, including hand hygiene and sterile gloves were used. The patient was positioned and the spine was prepped. The skin was anesthetized with lidocaine.  Free flow of clear CSF was obtained prior to injecting local anesthetic into the CSF.  The spinal needle aspirated freely following injection.  The needle was carefully withdrawn.  The patient tolerated the procedure well. Difficult placement, multiple attempts.

## 2022-07-03 NOTE — Op Note (Signed)
DATE OF SURGERY:  07/03/2022 TIME: 12:16 PM  PATIENT NAME:  Sara [name]   AGE: 67 y.o.    PRE-OPERATIVE DIAGNOSIS: End-stage left knee osteoarthritis, severe valgus deformity, preoperative stiffness range of motion 5 to 90 degrees  POST-OPERATIVE DIAGNOSIS:  Same  PROCEDURE: Left total Knee Arthroplasty  SURGEON:  Aloysius Heinle A Katrece Roediger, MD   ASSISTANT: Izola Price, RNFA, present and scrubbed throughout the case, critical for assistance with exposure, retraction, instrumentation, and closure.   OPERATIVE IMPLANTS:  Cemented Zimmer persona 9 PS femur narrow, F tibia, 30 mm tibial stem extension, 35 mm patella, 10 mm CPS poly Implant Name Type Inv. Item Serial No. Manufacturer Lot No. LRB No. Used Action  CEMENT BONE REFOBACIN [id number] [place] - [id number] Cement CEMENT BONE REFOBACIN [id number] [place]  ZIMMER RECON(ORTH,TRAU,BIO,SG) [id number] Left 2 Implanted  STEM TIB ST PERS 14+30 - [id number] Stem STEM TIB ST PERS 14+30  ZIMMER RECON(ORTH,TRAU,BIO,SG) [id number] Left 1 Implanted  STEM POLY PAT PLY 60M KNEE - [id number] Knees STEM POLY PAT PLY 60M KNEE  ZIMMER RECON(ORTH,TRAU,BIO,SG) [id number] Left 1 Implanted  TIBIA STEM 5 DEG SZ F L KNEE - [id number] Knees TIBIA STEM 5 DEG SZ F L KNEE  ZIMMER RECON(ORTH,TRAU,BIO,SG) [id number] Left 1 Implanted  COMP FEM PERSONA SZ9 LT - [id number] Joint COMP FEM PERSONA SZ9 LT  ZIMMER RECON(ORTH,TRAU,BIO,SG) [id number] Left 1 Implanted  PSN ASF CPS VE L 6-9EF 10 - [id number] Joint PSN ASF CPS VE L 6-9EF 10  ZIMMER RECON(ORTH,TRAU,BIO,SG) [id number] Left 1 Implanted      PREOPERATIVE INDICATIONS:  NATALEA SUTLIFF is a 67 y.o. year old female with end stage bone on bone degenerative arthritis of the knee who failed conservative treatment, including injections, antiinflammatories, activity modification, and assistive devices, and had significant impairment of their activities of daily living, and elected for Total Knee Arthroplasty.   The risks, benefits, and  alternatives were discussed at length including but not limited to the risks of infection, bleeding, nerve injury, stiffness, blood clots, the need for revision surgery, cardiopulmonary complications, among others, and they were willing to proceed.  OPERATIVE FINDINGS AND UNIQUE ASPECTS OF THE CASE: Given severe preoperative valgus deformity, perform extensive lateral release, despite this there was relative tightness laterally to medially to help balance the knee elected for CPS bearing and a tibial stem extension.  ESTIMATED BLOOD LOSS: 50cc  OPERATIVE DESCRIPTION:   Once adequate anesthesia was induced, preoperative antibiotics, 2 gm of ancef,1 gm of Tranexamic Acid, and 8 mg of Decadron administered, the patient was positioned supine with a left thigh tourniquet placed.  The left lower extremity was prepped and draped in sterile fashion.  A time-  out was performed identifying the patient, planned procedure, and the appropriate extremity.     The leg was  exsanguinated, tourniquet elevated to 250 mmHg.  A midline incision was  made followed by median parapatellar arthrotomy. Anterior horn of the medial meniscus was released and resected. A medial release was performed, the infrapatellar fat pad was resected with care taken to protect the patellar tendon. The suprapatellar fat was removed to exposed the distal anterior femur. The anterior horn of the lateral meniscus and ACL were released.    Following initial  exposure, I first started with the femur  The femoral  canal was opened with a drill, canal was suctioned to try to prevent fat emboli.  An  intramedullary rod was passed set at 8 degrees valgus, 11 mm. Patient has 3m of baseline valgus, and added additional  2 degrees of valgus given significant preop deformity . The distal femur was resected.  Following this resection, the tibia was  subluxated anteriorly.  Using the extramedullary guide, 2 mm of bone was resected off   the proximal lateral  tibial defect.  We confirmed the gap would be  stable medially and laterally with a size 26m spacer block as well as confirmed that the tibial cut was perpendicular in the coronal plane, checking with an alignment rod. The laterally side was still markedly tight which planned to release once we had trial implants in.   Once this was done, the posterior femoral referencing femoral sizer was placed under to the posterior condyles with 3 degrees of external rotational which was parallel to the transepicondylar axis and perpendicular to [employer] The femur was sized to be a size 9 in the anterior-  posterior dimension. The  anterior, posterior, and  chamfer cuts were made without difficulty nor   notching making certain that I was along the anterior cortex to help  with flexion gap stability. Next a laminar spreader was placed with the knee in flexion and the medial lateral menisci were resected.  5 cc of the Exparel mixture was injected in the medial side of the back of the knee and 3 cc in the lateral side.  1/2 inch curved osteotome was used to resect posterior osteophyte that was then removed with a pituitary rongeur.       At this point, the tibia was sized to be a size F.  The size F tray was  then pinned in position. Trial reduction was now carried with a 9 femur, F tibia, a 10 mm MC insert.  The knee had full extension and was stable to varus valgus stress in extension.    Using and 11 blade the IT band was carefully released latearlly to help balance the extension gap. Despite complete release lateral side was still notable tight. Elected to convert to a CPS bearing.  The femur was removed and PS femur was placed. The CPS box was cut and with a 126mtrial felt had better varus/valgus stability.  Attention was next directed to the patella.  Precut  measurement was noted to be 23 mm.  I resected down to 14 mm and used a  3532matellar button to restore patellar height as well as cover the cut  surface.     The patella lug holes were drilled and a 22m78mtella poly trial was placed.    The knee was brought to full extension with good flexion stability with the patella tracking through the trochlea without application of pressure.     Next the femoral component was again assessed and determined to be seated and appropriately lateralized.  The femoral lug holes were drilled.  The femoral component was then removed. Tibial component was again assessed and felt to be seated and appropriately rotated with the medial third of the tubercle. The tibia was then drilled, and keel punched.     Final components were  opened and antibiotic cement was mixed.      Final implants were then  cemented onto cleaned and dried cut surfaces of bone with the knee brought to extension with a 10 mm CPS poly.  The knee was irrigated with sterile Betadine diluted in saline as well as pulse lavage normal saline.  The synovial lining was  then injected a dilute Exparel.      Once the cement had fully cured, excess  cement was removed throughout the knee.  I confirmed that I was satisfied with the range of motion and stability, and the final 12 mm CPS poly insert was chosen.  It was placed into the knee.         The tourniquet had been let down at 90 minutes.  No significant hemostasis was required.  The medial parapatellar arthrotomy was then reapproximated using #1 Stratafix sutures with the knee  in flexion.  The remaining wound was closed with 0 stratafix, 2-0 Vicryl, and running 3-0 Monocryl. The knee was cleaned, dried, dressed sterilely using Dermabond and   Aquacel dressing.  The patient was then brought to recovery room in stable condition, tolerating the procedure  well. There were no complications.   Post op recs: WB: WBAT Abx: ancef Imaging: PACU xrays DVT prophylaxis: Eliquis 2.'5mg'$  BID given hx of cancer Follow up: 2 weeks after surgery for a wound check with Dr. Zachery Dakins at [facility].  Address: [address], [place], [place] [address]  Office Phone: [phone]  Charlies Constable, MD Orthopaedic Surgery

## 2022-07-03 NOTE — Discharge Instructions (Signed)

## 2022-07-03 NOTE — Anesthesia Postprocedure Evaluation (Signed)
Anesthesia Post Note  Patient: Sara [name]  Procedure(s) Performed: TOTAL KNEE ARTHROPLASTY (Left: Knee)     Patient location during evaluation: PACU Anesthesia Type: Spinal Level of consciousness: awake and alert Pain management: pain level controlled Vital Signs Assessment: post-procedure vital signs reviewed and stable Respiratory status: spontaneous breathing and respiratory function stable Cardiovascular status: blood pressure returned to baseline and stable Postop Assessment: spinal receding Anesthetic complications: no   No notable events documented.  Last Vitals:  Vitals:   07/03/22 1410 07/03/22 1430  BP: (!) 151/85 (!) 155/88  Pulse: (!) 58 71  Resp: 14 13  Temp: (!) 36.2 C (!) 36.4 C  SpO2: 97% 100%    Last Pain:  Vitals:   07/03/22 1430  TempSrc:   PainSc: 0-No pain                 Blakelee Allington DANIEL

## 2022-07-05 ENCOUNTER — Encounter (HOSPITAL_COMMUNITY): Payer: Self-pay | Admitting: Orthopedic Surgery

## 2022-07-05 ENCOUNTER — Emergency Department (HOSPITAL_COMMUNITY): Payer: Medicare Other

## 2022-07-05 ENCOUNTER — Emergency Department (HOSPITAL_COMMUNITY)
Admission: EM | Admit: 2022-07-05 | Discharge: 2022-07-05 | Disposition: A | Discharge status: Home / Self Care | Payer: Medicare Other | Attending: Emergency Medicine | Admitting: Emergency Medicine

## 2022-07-05 ENCOUNTER — Other Ambulatory Visit: Payer: Self-pay

## 2022-07-05 DIAGNOSIS — Z7901 Long term (current) use of anticoagulants: Secondary | ICD-10-CM | POA: Insufficient documentation

## 2022-07-05 DIAGNOSIS — Z7984 Long term (current) use of oral hypoglycemic drugs: Secondary | ICD-10-CM | POA: Insufficient documentation

## 2022-07-05 DIAGNOSIS — I10 Essential (primary) hypertension: Secondary | ICD-10-CM | POA: Diagnosis not present

## 2022-07-05 DIAGNOSIS — R112 Nausea with vomiting, unspecified: Secondary | ICD-10-CM | POA: Diagnosis not present

## 2022-07-05 DIAGNOSIS — R1110 Vomiting, unspecified: Secondary | ICD-10-CM | POA: Diagnosis not present

## 2022-07-05 DIAGNOSIS — R6883 Chills (without fever): Secondary | ICD-10-CM | POA: Diagnosis not present

## 2022-07-05 DIAGNOSIS — Z79899 Other long term (current) drug therapy: Secondary | ICD-10-CM | POA: Insufficient documentation

## 2022-07-05 DIAGNOSIS — Z20822 Contact with and (suspected) exposure to covid-19: Secondary | ICD-10-CM | POA: Diagnosis not present

## 2022-07-05 DIAGNOSIS — Z96652 Presence of left artificial knee joint: Secondary | ICD-10-CM | POA: Insufficient documentation

## 2022-07-05 DIAGNOSIS — R7309 Other abnormal glucose: Secondary | ICD-10-CM | POA: Diagnosis not present

## 2022-07-05 DIAGNOSIS — E1165 Type 2 diabetes mellitus with hyperglycemia: Secondary | ICD-10-CM | POA: Diagnosis not present

## 2022-07-05 LAB — COMPREHENSIVE METABOLIC PANEL
ALT: 18 U/L (ref 0–44)
AST: 18 U/L (ref 15–41)
Albumin: 4.1 g/dL (ref 3.5–5.0)
Alkaline Phosphatase: 82 U/L (ref 38–126)
Anion gap: 13 (ref 5–15)
BUN: 20 mg/dL (ref 8–23)
CO2: 24 mmol/L (ref 22–32)
Calcium: 9.2 mg/dL (ref 8.9–10.3)
Chloride: 97 mmol/L — ABNORMAL LOW (ref 98–111)
Creatinine, Ser: 0.84 mg/dL (ref 0.44–1.00)
GFR, Estimated: >60 mL/min (ref >60)
Glucose, Bld: 233 mg/dL — ABNORMAL HIGH (ref 70–99)
Potassium: 3.7 mmol/L (ref 3.5–5.1)
Sodium: 134 mmol/L — ABNORMAL LOW (ref 135–145)
Total Bilirubin: 0.6 mg/dL (ref 0.3–1.2)
Total Protein: 7.7 g/dL (ref 6.5–8.1)

## 2022-07-05 LAB — URINALYSIS, ROUTINE W REFLEX MICROSCOPIC
Bilirubin Urine: NEGATIVE
Glucose, UA: >=500 mg/dL — AB
Hgb urine dipstick: NEGATIVE
Ketones, ur: 5 mg/dL — AB
Leukocytes,Ua: NEGATIVE
Nitrite: NEGATIVE
Protein, ur: NEGATIVE mg/dL
Specific Gravity, Urine: 1.009 (ref 1.005–1.030)
pH: 6 (ref 5.0–8.0)

## 2022-07-05 LAB — CBC
HCT: 34.2 % — ABNORMAL LOW (ref 36.0–46.0)
Hemoglobin: 11.4 g/dL — ABNORMAL LOW (ref 12.0–15.0)
MCH: 28.6 pg (ref 26.0–34.0)
MCHC: 33.3 g/dL (ref 30.0–36.0)
MCV: 85.7 fL (ref 80.0–100.0)
Platelets: 292 10*3/uL (ref 150–400)
RBC: 3.99 MIL/uL (ref 3.87–5.11)
RDW: 15.2 % (ref 11.5–15.5)
WBC: 11.3 10*3/uL — ABNORMAL HIGH (ref 4.0–10.5)
nRBC: 0 % (ref 0.0–0.2)

## 2022-07-05 LAB — RESP PANEL BY RT-PCR (RSV, FLU A&B, COVID)  RVPGX2
Influenza A by PCR: NEGATIVE
Influenza B by PCR: NEGATIVE
Resp Syncytial Virus by PCR: NEGATIVE
SARS Coronavirus 2 by RT PCR: NEGATIVE

## 2022-07-05 LAB — LIPASE, BLOOD: Lipase: 25 U/L (ref 11–51)

## 2022-07-05 LAB — CBG MONITORING, ED: Glucose-Capillary: 163 mg/dL — ABNORMAL HIGH (ref 70–99)

## 2022-07-05 MED ORDER — ONDANSETRON 4 MG PO TBDP
4.0000 mg | ORAL_TABLET | Freq: Once | ORAL | Status: AC
  Administered 2022-07-05: 4 mg via ORAL
  Filled 2022-07-05: qty 1

## 2022-07-05 MED ORDER — INSULIN ASPART 100 UNIT/ML IJ SOLN
6.0000 [IU] | Freq: Once | INTRAMUSCULAR | Status: AC
  Administered 2022-07-05: 6 [IU] via SUBCUTANEOUS

## 2022-07-05 MED ORDER — SODIUM CHLORIDE 0.9 % IV BOLUS
1000.0000 mL | Freq: Once | INTRAVENOUS | Status: AC
  Administered 2022-07-05: 1000 mL via INTRAVENOUS

## 2022-07-05 NOTE — Discharge Instructions (Addendum)
Please followup with PCP in the next few days regarding recent ER visit. If symptoms worsen please return to ER.  Contact a doctor if: Your symptoms get worse. You have new symptoms. You have a fever. You cannot drink fluids without vomiting. You feel like you may vomit for more than 2 days. You feel light-headed or dizzy. You have a headache. You have muscle cramps. You have a rash. You have pain while peeing. Get help right away if: You have pain in your chest, neck, arm, or jaw. You feel very weak or you faint. You vomit again and again. You have vomit that is bright red or looks like black coffee grounds. You have bloody or black poop (stools) or poop that looks like tar. You have a very bad headache, a stiff neck, or both. You have very bad pain, cramping, or bloating in your belly (abdomen). You have trouble breathing. You are breathing very quickly. Your heart is beating very quickly. Your skin feels cold and clammy. You feel confused. You have signs of losing too much water in your body, such as: Dark pee, very little pee, or no pee. Cracked lips. Dry mouth. Sunken eyes. Sleepiness. Weakness. These symptoms may be an emergency. Get help right away. Call 911. Do not wait to see if the symptoms will go away. Do not drive yourself to the hospital

## 2022-07-05 NOTE — ED Triage Notes (Signed)
Pt. Stated, I woke up around 200 this morning with chills, sweating N/V. Usually when this happens I get a bag of fluids and carry on. I had a knee replacement on Monday

## 2022-07-05 NOTE — ED Provider Notes (Signed)
  Physical Exam  BP (!) 109/47   Pulse 75   Temp 98.2 F (36.8 C) (Oral)   Resp 15   Ht '5\' 8"'$  (1.727 m)   Wt 117.9 kg   SpO2 100%   BMI 39.53 kg/m   Physical Exam Vitals and nursing note reviewed.  Constitutional:      General: She is not in acute distress.    Appearance: Normal appearance. She is not ill-appearing or toxic-appearing.  Eyes:     General: No scleral icterus. Pulmonary:     Effort: Pulmonary effort is normal. No respiratory distress.  Skin:    General: Skin is dry.     Findings: No rash.  Neurological:     Mental Status: She is alert.     Procedures  Procedures  ED Course / MDM    Medical Decision Making Amount and/or Complexity of Data Reviewed Labs: ordered. Radiology: ordered.  Risk Prescription drug management.    Accepted handoff at shift change from [facility], PA-C. Please see prior provider note for more detail.   Briefly: Patient is 67 y.o. F presenting for evaluation of nausea, vomiting, and chills.  Plan: Reassess after fluids.   PO challenge initiated. The patient reports that she is feeling much better now. She has not had any emesis since being given the crackers and water. CBG recheck at 163 which is downtrending from previous at 233. The patient does not need any refills of her Januvia. We discussed strict return precautions and red flag symptoms. The patient verbalized her understanding and agrees to the plan. The patient is stable and being discharged home in good condition.       Sherrell Puller, PA-C 07/05/22 1655    Lacretia Leigh, MD 07/07/22 [phone]

## 2022-07-05 NOTE — ED Notes (Signed)
N/A for rooming x 2

## 2022-07-05 NOTE — ED Provider Notes (Signed)
[facility] DEPART[facility] Provider Note   CSN: [id number] Arrival date & time: 07/05/22  1308     History  Chief Complaint  Patient presents with   Chills   Excessive Sweating   Nausea   Emesis    Sara [name] is a 67 y.o. female s/p L knee replacement 07/03/22, h/o DM, HTN, presented today with 12 hours of chills and emesis.  Patient Sara [name] has been unable to keep food/fluids down orally.  Patient reported that Sara [name] has not been out of her [place] and went that happens these symptoms occur. Patient denied any symptoms pertaining to her knee replacement.  Patient denies chest pain, shortness of breath, fever, dysuria, abdominal pain, headache, [place], left knee pain, left knee edema, left knee erythema, left knee warmth.  Home Medications Prior to Admission medications   Medication Sig Start Date End Date Taking? Authorizing Provider  acetaminophen (TYLENOL) 500 MG tablet Take 1,000-1,500 mg by mouth every 6 (six) hours as needed for headache or mild pain (pain).    [provider]  amLODipine (NORVASC) 10 MG tablet Take 1 tablet (10 mg total) by mouth daily. Hold, discuss BP management with PCP Patient taking differently: Take 10 mg by mouth daily. 01/20/18   Mikhail, Velta Addison, DO  apixaban (ELIQUIS) 2.5 MG TABS tablet Take 1 tablet (2.5 mg total) by mouth 2 (two) times daily. 07/03/22   Willaim Sheng, MD  Ascorbic Acid (VITAMIN C) 1000 MG tablet Take 1,000 mg by mouth 2 (two) times daily.    [provider]  atenolol (TENORMIN) 100 MG tablet Take 1 tablet (100 mg total) by mouth daily. Hold, discuss BP management with PCP Patient taking differently: Take 100 mg by mouth daily. 01/20/18   Mikhail, Velta Addison, DO  Calcium-Magnesium-Vitamin D (CITRACAL SLOW RELEASE PO) Take 1 tablet by mouth daily.    [provider]  celecoxib (CELEBREX) 100 MG capsule Take 1 capsule (100 mg total) by mouth 2 (two) times daily for 14 days. 07/03/22  07/17/22  Willaim Sheng, MD  Cholecalciferol (VITAMIN D3) 25 MCG (1000 UT) CAPS Take 1,000 Units by mouth daily.    [provider]  ferrous sulfate 324 MG TBEC Take 324 mg by mouth daily with breakfast.    [provider]  Glucos-Chond-Sterol-Fish Oil (GLUCOSAMINE CHONDROITIN PLUS) CAPS Take 2 tablets by mouth daily.    [provider]  KLOR-CON M10 10 MEQ tablet Take 10 mEq by mouth 2 (two) times daily. 06/24/20   [provider]  latanoprost (XALATAN) 0.005 % ophthalmic solution Place 1 drop into both eyes at bedtime. 01/16/18   [provider]  levothyroxine (SYNTHROID) 150 MCG tablet Take 150 mcg by mouth daily. 11/04/19   [provider]  methocarbamol (ROBAXIN) 500 MG tablet Take 1 tablet (500 mg total) by mouth every 8 (eight) hours as needed for up to 10 days for muscle spasms. 07/03/22 07/13/22  Willaim Sheng, MD  ondansetron (ZOFRAN) 4 MG tablet Take 1 tablet (4 mg total) by mouth every 8 (eight) hours as needed for up to 14 days for nausea or vomiting. 07/03/22 07/17/22  Willaim Sheng, MD  oxyCODONE (ROXICODONE) 5 MG immediate release tablet Take 1 tablet (5 mg total) by mouth every 4 (four) hours as needed for up to 7 days for severe pain or moderate pain. 07/03/22 07/10/22  Willaim Sheng, MD  pantoprazole (PROTONIX) 40 MG tablet Take 40 mg by mouth daily. 10/27/19   [provider]  simvastatin (ZOCOR) 20 MG tablet Take 20 mg by mouth at bedtime.     [provider]  sitaGLIPtin (JANUVIA) 100 MG tablet Take 100 mg by mouth daily.    [provider]  valsartan (DIOVAN) 40 MG tablet Take 40 mg by mouth daily. 05/08/22   [provider]      Allergies    Dyazide [hydrochlorothiazide w-triamterene] and Metformin and related    Review of Systems   Review of Systems  Gastrointestinal:  Positive for vomiting.  See HPI  Physical Exam Updated Vital Signs BP (!) 109/47   Pulse 75    Temp 98.2 F (36.8 C) (Oral)   Resp 15   Ht '5\' 8"'$  (1.727 m)   Wt 117.9 kg   SpO2 100%   BMI 39.53 kg/m  Physical Exam Vitals and nursing note reviewed.  Constitutional:      General: Sara [name] is not in acute distress.    Appearance: Sara [name] is well-developed.     Comments: + emesis  HENT:     Head: Normocephalic and atraumatic.  Eyes:     Extraocular Movements: Extraocular movements intact.     Conjunctiva/sclera: Conjunctivae normal.     Pupils: Pupils are equal, round, and reactive to light.  Cardiovascular:     Rate and Rhythm: Normal rate and regular rhythm.     Heart sounds: No murmur heard. Pulmonary:     Effort: Pulmonary effort is normal. No respiratory distress.     Breath sounds: Normal breath sounds.  Abdominal:     General: Abdomen is flat. Bowel sounds are normal.     Palpations: Abdomen is soft. There is no mass.     Tenderness: There is no abdominal tenderness. There is no guarding.  Musculoskeletal:     Cervical back: Normal range of motion and neck supple.       Legs:     Comments: L knee: swelling secondary to surgery; no erythema, warmth, drainage from the dressing, or pain.  Skin:    General: Skin is warm and dry.     Capillary Refill: Capillary refill takes less than 2 seconds.  Neurological:     General: No focal deficit present.     Mental Status: Sara [name] is alert and oriented to person, place, and time.  Psychiatric:        Mood and Affect: Mood normal.     ED Results / Procedures / Treatments   Labs (all labs ordered are listed, but only abnormal results are displayed) Labs Reviewed  COMPREHENSIVE METABOLIC PANEL - Abnormal; Notable for the following components:      Result Value   Sodium 134 (*)    Chloride 97 (*)    Glucose, Bld 233 (*)    All other components within normal limits  CBC - Abnormal; Notable for the following components:   WBC 11.3 (*)    Hemoglobin 11.4 (*)    HCT 34.2 (*)    All other components within normal limits   URINALYSIS, ROUTINE W REFLEX MICROSCOPIC - Abnormal; Notable for the following components:   Color, Urine STRAW (*)    Glucose, UA >=500 (*)    Ketones, ur 5 (*)    Bacteria, UA RARE (*)    All other components within normal limits  RESP PANEL BY RT-PCR (RSV, FLU A&B, COVID)  RVPGX2  LIPASE, BLOOD    EKG None  Radiology DG Chest 2 View  Result Date: 07/05/2022 CLINICAL DATA:  Chills with  emesis. EXAM: CHEST - 2 VIEW COMPARISON:  CXRE 07/19/20 FINDINGS: No pleural effusion. No pneumothorax. Normal cardiac and mediastinal contours. No focal airspace opacity. No displaced rib fractures. Visualized upper abdomen is unremarkable. Degenerative changes of the right AC joint. IMPRESSION: No focal airspace opacity. Electronically Signed   By: Marin Roberts M.D.   On: 07/05/2022 14:23    Procedures Procedures    Medications Ordered in ED Medications  ondansetron (ZOFRAN-ODT) disintegrating tablet 4 mg (4 mg Oral Given 07/05/22 0724)  sodium chloride 0.9 % bolus 1,000 mL (1,000 mLs Intravenous New Bag/Given 07/05/22 1403)  insulin aspart (novoLOG) injection 6 Units (6 Units Subcutaneous Given 07/05/22 1353)    ED Course/ Medical Decision Making/ A&P                           Medical Decision Making Amount and/or Complexity of Data Reviewed Labs: ordered.  Risk Prescription drug management.   Doriann J Frame 67 y.o. presented today for chills and N/V. Working DDx that I considered at this time includes, but not limited to, septic arthritis from surgery, DKA, PE, viral illness.  Review of prior external notes: 06/14/22 H&P  Unique Tests and My Interpretation: Resp panel: CMP: unremarkable CBC: slightly elevated WBC 11.3; the rest is unremarkable UA: high glucose; the rest is unremarkable Lipase: unremarkable CXR:  Discussion with Independent Historian: none  Discussion of Management of Tests: none  Risk: Low:  - based on diagnostic testing/clinical impression and  treatment plan  Risk Stratification Score: none  Staffed with Lacretia Leigh, MD  R/o DDx: Septic Arthritis: knee was not erythematous, warm, or painful PE: patient denied any chest pain or shortness of breath. PE would also not explain chills  Plan: Patient presented with chills and emesis that began this morning. Patient stated this is similar to episodes in the past when Sara [name] is out of her [place]. After a reassuring knee exam, I do not suspect septic joint as the source of the symptoms. We will obtain labs to assess for infection and glucose levels. Glucose levels were 233 which according to the patient is high for her. Patient was given fluids, 6 U regular insulin, zofran to alleviate symptoms.  On recheck patient endorsed resolution of symptoms. Once patient is able to tolerate PO Sara [name] may be discharged with outpatient followup. Patient just picked up zofran yesterday so I will not prescribe any zofran today. Patient has remained stable during ED visit. Patient was signed out to oncoming team at 1530.   Final Clinical Impression(s) / ED Diagnoses Final diagnoses:  Elevated glucose    Rx / DC Orders ED Discharge Orders     None         Elvina Sidle 07/05/22 1526    Lacretia Leigh, MD 07/07/22 1439

## 2022-07-05 NOTE — ED Provider Notes (Signed)
I provided a substantive portion of the care of this patient.  I personally performed the entirety of the medical decision making for this encounter.     67 year old female presents with nausea and vomiting and chills.  States that she has been off her Januvia and that she has had similar symptoms when this occurs.  Usually feels better with IV fluids.  Patient's postsurgical site looks fine without infection.  Her abdominal exam is benign.  Will hydrate and likely discharge home   Lacretia Leigh, MD 07/05/22 1339

## 2022-07-12 DIAGNOSIS — R262 Difficulty in walking, not elsewhere classified: Secondary | ICD-10-CM | POA: Diagnosis not present

## 2022-07-12 DIAGNOSIS — M1712 Unilateral primary osteoarthritis, left knee: Secondary | ICD-10-CM | POA: Diagnosis not present

## 2022-07-12 DIAGNOSIS — M6281 Muscle weakness (generalized): Secondary | ICD-10-CM | POA: Diagnosis not present

## 2022-07-12 DIAGNOSIS — M25662 Stiffness of left knee, not elsewhere classified: Secondary | ICD-10-CM | POA: Diagnosis not present

## 2022-07-13 DIAGNOSIS — M1712 Unilateral primary osteoarthritis, left knee: Secondary | ICD-10-CM | POA: Diagnosis not present

## 2022-07-18 DIAGNOSIS — M25662 Stiffness of left knee, not elsewhere classified: Secondary | ICD-10-CM | POA: Diagnosis not present

## 2022-07-18 DIAGNOSIS — R262 Difficulty in walking, not elsewhere classified: Secondary | ICD-10-CM | POA: Diagnosis not present

## 2022-07-18 DIAGNOSIS — M6281 Muscle weakness (generalized): Secondary | ICD-10-CM | POA: Diagnosis not present

## 2022-07-18 DIAGNOSIS — M1712 Unilateral primary osteoarthritis, left knee: Secondary | ICD-10-CM | POA: Diagnosis not present

## 2022-07-20 DIAGNOSIS — M25662 Stiffness of left knee, not elsewhere classified: Secondary | ICD-10-CM | POA: Diagnosis not present

## 2022-07-20 DIAGNOSIS — R262 Difficulty in walking, not elsewhere classified: Secondary | ICD-10-CM | POA: Diagnosis not present

## 2022-07-20 DIAGNOSIS — M6281 Muscle weakness (generalized): Secondary | ICD-10-CM | POA: Diagnosis not present

## 2022-07-20 DIAGNOSIS — M1712 Unilateral primary osteoarthritis, left knee: Secondary | ICD-10-CM | POA: Diagnosis not present

## 2022-07-25 DIAGNOSIS — R262 Difficulty in walking, not elsewhere classified: Secondary | ICD-10-CM | POA: Diagnosis not present

## 2022-07-25 DIAGNOSIS — M25662 Stiffness of left knee, not elsewhere classified: Secondary | ICD-10-CM | POA: Diagnosis not present

## 2022-07-25 DIAGNOSIS — M6281 Muscle weakness (generalized): Secondary | ICD-10-CM | POA: Diagnosis not present

## 2022-07-25 DIAGNOSIS — M1712 Unilateral primary osteoarthritis, left knee: Secondary | ICD-10-CM | POA: Diagnosis not present

## 2022-07-27 DIAGNOSIS — M6281 Muscle weakness (generalized): Secondary | ICD-10-CM | POA: Diagnosis not present

## 2022-07-27 DIAGNOSIS — M1712 Unilateral primary osteoarthritis, left knee: Secondary | ICD-10-CM | POA: Diagnosis not present

## 2022-07-27 DIAGNOSIS — M25662 Stiffness of left knee, not elsewhere classified: Secondary | ICD-10-CM | POA: Diagnosis not present

## 2022-07-27 DIAGNOSIS — R262 Difficulty in walking, not elsewhere classified: Secondary | ICD-10-CM | POA: Diagnosis not present

## 2022-08-01 DIAGNOSIS — M6281 Muscle weakness (generalized): Secondary | ICD-10-CM | POA: Diagnosis not present

## 2022-08-01 DIAGNOSIS — R262 Difficulty in walking, not elsewhere classified: Secondary | ICD-10-CM | POA: Diagnosis not present

## 2022-08-01 DIAGNOSIS — M25662 Stiffness of left knee, not elsewhere classified: Secondary | ICD-10-CM | POA: Diagnosis not present

## 2022-08-01 DIAGNOSIS — M1712 Unilateral primary osteoarthritis, left knee: Secondary | ICD-10-CM | POA: Diagnosis not present

## 2022-08-03 DIAGNOSIS — M6281 Muscle weakness (generalized): Secondary | ICD-10-CM | POA: Diagnosis not present

## 2022-08-03 DIAGNOSIS — M1712 Unilateral primary osteoarthritis, left knee: Secondary | ICD-10-CM | POA: Diagnosis not present

## 2022-08-03 DIAGNOSIS — R262 Difficulty in walking, not elsewhere classified: Secondary | ICD-10-CM | POA: Diagnosis not present

## 2022-08-03 DIAGNOSIS — M25662 Stiffness of left knee, not elsewhere classified: Secondary | ICD-10-CM | POA: Diagnosis not present

## 2022-08-08 DIAGNOSIS — M1712 Unilateral primary osteoarthritis, left knee: Secondary | ICD-10-CM | POA: Diagnosis not present

## 2022-08-08 DIAGNOSIS — R262 Difficulty in walking, not elsewhere classified: Secondary | ICD-10-CM | POA: Diagnosis not present

## 2022-08-08 DIAGNOSIS — M25662 Stiffness of left knee, not elsewhere classified: Secondary | ICD-10-CM | POA: Diagnosis not present

## 2022-08-08 DIAGNOSIS — M6281 Muscle weakness (generalized): Secondary | ICD-10-CM | POA: Diagnosis not present

## 2022-08-10 DIAGNOSIS — M6281 Muscle weakness (generalized): Secondary | ICD-10-CM | POA: Diagnosis not present

## 2022-08-10 DIAGNOSIS — M25662 Stiffness of left knee, not elsewhere classified: Secondary | ICD-10-CM | POA: Diagnosis not present

## 2022-08-10 DIAGNOSIS — R262 Difficulty in walking, not elsewhere classified: Secondary | ICD-10-CM | POA: Diagnosis not present

## 2022-08-10 DIAGNOSIS — M1712 Unilateral primary osteoarthritis, left knee: Secondary | ICD-10-CM | POA: Diagnosis not present

## 2022-08-15 DIAGNOSIS — R262 Difficulty in walking, not elsewhere classified: Secondary | ICD-10-CM | POA: Diagnosis not present

## 2022-08-15 DIAGNOSIS — M25662 Stiffness of left knee, not elsewhere classified: Secondary | ICD-10-CM | POA: Diagnosis not present

## 2022-08-15 DIAGNOSIS — M6281 Muscle weakness (generalized): Secondary | ICD-10-CM | POA: Diagnosis not present

## 2022-08-15 DIAGNOSIS — M1712 Unilateral primary osteoarthritis, left knee: Secondary | ICD-10-CM | POA: Diagnosis not present

## 2022-08-17 DIAGNOSIS — R262 Difficulty in walking, not elsewhere classified: Secondary | ICD-10-CM | POA: Diagnosis not present

## 2022-08-17 DIAGNOSIS — M6281 Muscle weakness (generalized): Secondary | ICD-10-CM | POA: Diagnosis not present

## 2022-08-17 DIAGNOSIS — M25662 Stiffness of left knee, not elsewhere classified: Secondary | ICD-10-CM | POA: Diagnosis not present

## 2022-08-17 DIAGNOSIS — M1712 Unilateral primary osteoarthritis, left knee: Secondary | ICD-10-CM | POA: Diagnosis not present

## 2022-08-22 DIAGNOSIS — M25662 Stiffness of left knee, not elsewhere classified: Secondary | ICD-10-CM | POA: Diagnosis not present

## 2022-08-22 DIAGNOSIS — R262 Difficulty in walking, not elsewhere classified: Secondary | ICD-10-CM | POA: Diagnosis not present

## 2022-08-22 DIAGNOSIS — M6281 Muscle weakness (generalized): Secondary | ICD-10-CM | POA: Diagnosis not present

## 2022-08-22 DIAGNOSIS — M1712 Unilateral primary osteoarthritis, left knee: Secondary | ICD-10-CM | POA: Diagnosis not present

## 2022-08-24 DIAGNOSIS — M1712 Unilateral primary osteoarthritis, left knee: Secondary | ICD-10-CM | POA: Diagnosis not present

## 2022-08-24 DIAGNOSIS — M25662 Stiffness of left knee, not elsewhere classified: Secondary | ICD-10-CM | POA: Diagnosis not present

## 2022-08-24 DIAGNOSIS — M6281 Muscle weakness (generalized): Secondary | ICD-10-CM | POA: Diagnosis not present

## 2022-08-24 DIAGNOSIS — R262 Difficulty in walking, not elsewhere classified: Secondary | ICD-10-CM | POA: Diagnosis not present

## 2022-08-29 DIAGNOSIS — M6281 Muscle weakness (generalized): Secondary | ICD-10-CM | POA: Diagnosis not present

## 2022-08-29 DIAGNOSIS — M25662 Stiffness of left knee, not elsewhere classified: Secondary | ICD-10-CM | POA: Diagnosis not present

## 2022-08-29 DIAGNOSIS — R262 Difficulty in walking, not elsewhere classified: Secondary | ICD-10-CM | POA: Diagnosis not present

## 2022-08-29 DIAGNOSIS — M1712 Unilateral primary osteoarthritis, left knee: Secondary | ICD-10-CM | POA: Diagnosis not present

## 2022-09-01 DIAGNOSIS — R262 Difficulty in walking, not elsewhere classified: Secondary | ICD-10-CM | POA: Diagnosis not present

## 2022-09-01 DIAGNOSIS — M1712 Unilateral primary osteoarthritis, left knee: Secondary | ICD-10-CM | POA: Diagnosis not present

## 2022-09-01 DIAGNOSIS — M25662 Stiffness of left knee, not elsewhere classified: Secondary | ICD-10-CM | POA: Diagnosis not present

## 2022-09-01 DIAGNOSIS — M6281 Muscle weakness (generalized): Secondary | ICD-10-CM | POA: Diagnosis not present

## 2022-09-07 DIAGNOSIS — E1169 Type 2 diabetes mellitus with other specified complication: Secondary | ICD-10-CM | POA: Diagnosis not present

## 2022-09-07 DIAGNOSIS — I10 Essential (primary) hypertension: Secondary | ICD-10-CM | POA: Diagnosis not present

## 2022-09-07 DIAGNOSIS — E1136 Type 2 diabetes mellitus with diabetic cataract: Secondary | ICD-10-CM | POA: Diagnosis not present

## 2022-09-07 DIAGNOSIS — M7989 Other specified soft tissue disorders: Secondary | ICD-10-CM | POA: Diagnosis not present

## 2022-09-07 DIAGNOSIS — E7800 Pure hypercholesterolemia, unspecified: Secondary | ICD-10-CM | POA: Diagnosis not present

## 2022-09-07 DIAGNOSIS — E039 Hypothyroidism, unspecified: Secondary | ICD-10-CM | POA: Diagnosis not present

## 2022-09-08 DIAGNOSIS — M7989 Other specified soft tissue disorders: Secondary | ICD-10-CM | POA: Diagnosis not present

## 2022-09-08 DIAGNOSIS — R600 Localized edema: Secondary | ICD-10-CM | POA: Diagnosis not present

## 2022-09-28 DIAGNOSIS — M1712 Unilateral primary osteoarthritis, left knee: Secondary | ICD-10-CM | POA: Diagnosis not present

## 2022-10-11 DIAGNOSIS — Z1231 Encounter for screening mammogram for malignant neoplasm of breast: Secondary | ICD-10-CM | POA: Diagnosis not present

## 2022-10-19 DIAGNOSIS — M1711 Unilateral primary osteoarthritis, right knee: Secondary | ICD-10-CM | POA: Diagnosis not present

## 2022-11-07 DIAGNOSIS — M1711 Unilateral primary osteoarthritis, right knee: Secondary | ICD-10-CM | POA: Diagnosis not present

## 2022-11-08 NOTE — Progress Notes (Signed)
Sent message, via epic in basket, requesting orders in epic from surgeon.  

## 2022-11-13 NOTE — Progress Notes (Signed)
Second request for pre op orders in [facility], left voicemail with Kelly High.  

## 2022-11-14 ENCOUNTER — Ambulatory Visit (HOSPITAL_COMMUNITY): Payer: Self-pay | Admitting: Emergency Medicine

## 2022-11-14 DIAGNOSIS — G8929 Other chronic pain: Secondary | ICD-10-CM

## 2022-11-14 NOTE — H&P (Signed)
TOTAL KNEE ADMISSION H&P  Patient is being admitted for right total knee arthroplasty.  Subjective:  Chief Complaint:right knee pain.  HPI: Sara [name], 67 y.o. female, has a history of pain and functional disability in the right knee due to arthritis and has failed non-surgical conservative treatments for greater than 12 weeks to includeNSAID's and/or analgesics, activity modification, and home exercises/strengthening .  Onset of symptoms was gradual, starting 3 years ago with gradually worsening course since that time. The patient noted no past surgery on the right knee(s).  Patient currently rates pain in the right knee(s) at 7 out of 10 with activity. Patient has night pain, worsening of pain with activity and weight bearing, pain that interferes with activities of daily living, and pain with passive range of motion.  Patient has evidence of periarticular osteophytes and joint space narrowing by imaging studies.  There is no active infection.  Patient Active Problem List   Diagnosis Date Noted   Hypovolemic shock (HCC) 01/18/2018   Nausea and vomiting 01/18/2018   Diarrhea 01/18/2018   Hyponatremia 01/18/2018   Dyslipidemia associated with type 2 diabetes mellitus (HCC) 08/19/2016   Acute kidney failure (HCC) 08/18/2016   Hypokalemia 08/08/2016   Hypothyroidism 07/04/2011   HTN (hypertension) 07/04/2011   Past Medical History:  Diagnosis Date   High cholesterol    Hypertension    Hypothyroidism    Type II diabetes mellitus (HCC)     Past Surgical History:  Procedure Laterality Date   COLONOSCOPY  08/07/2016   DILATION AND CURETTAGE OF UTERUS  1990   ESOPHAGOGASTRODUODENOSCOPY  07/06/2011   Procedure: ESOPHAGOGASTRODUODENOSCOPY (EGD);  Surgeon: Theda Belfast, MD;  Location: [facility] ENDOSCOPY;  Service: Endoscopy;  Laterality: N/A;   OOPHORECTOMY Right 1990s   "& cyst w/it"   TOTAL KNEE ARTHROPLASTY Left 07/03/2022   Procedure: TOTAL KNEE ARTHROPLASTY;  Surgeon: Joen Laura, MD;  Location: [facility] ORS;  Service: Orthopedics;  Laterality: Left;   TOTAL THYROIDECTOMY  2006    Current Outpatient Medications  Medication Sig Dispense Refill Last Dose   acetaminophen (TYLENOL) 500 MG tablet Take 1,000-1,500 mg by mouth every 6 (six) hours as needed for headache or mild pain (pain).      amLODipine (NORVASC) 10 MG tablet Take 1 tablet (10 mg total) by mouth daily. Hold, discuss BP management with PCP (Patient taking differently: Take 10 mg by mouth daily.)      apixaban (ELIQUIS) 2.5 MG TABS tablet Take 1 tablet (2.5 mg total) by mouth 2 (two) times daily. 60 tablet 0    Ascorbic Acid (VITAMIN C) 1000 MG tablet Take 1,000 mg by mouth 2 (two) times daily.      atenolol (TENORMIN) 100 MG tablet Take 1 tablet (100 mg total) by mouth daily. Hold, discuss BP management with PCP (Patient taking differently: Take 100 mg by mouth daily.)  3    Calcium-Magnesium-Vitamin D (CITRACAL SLOW RELEASE PO) Take 1 tablet by mouth daily.      Cholecalciferol (VITAMIN D3) 25 MCG (1000 UT) CAPS Take 1,000 Units by mouth daily.      ferrous sulfate 324 MG TBEC Take 324 mg by mouth daily with breakfast.      Glucos-Chond-Sterol-Fish Oil (GLUCOSAMINE CHONDROITIN PLUS) CAPS Take 2 tablets by mouth daily.      KLOR-CON M10 10 MEQ tablet Take 10 mEq by mouth 2 (two) times daily.      latanoprost (XALATAN) 0.005 % ophthalmic solution Place 1 drop into both eyes at bedtime.  levothyroxine (SYNTHROID) 150 MCG tablet Take 150 mcg by mouth daily.      pantoprazole (PROTONIX) 40 MG tablet Take 40 mg by mouth daily.      simvastatin (ZOCOR) 20 MG tablet Take 20 mg by mouth at bedtime.       sitaGLIPtin (JANUVIA) 100 MG tablet Take 100 mg by mouth daily.      valsartan (DIOVAN) 40 MG tablet Take 40 mg by mouth daily.      No current facility-administered medications for this visit.   Allergies  Allergen Reactions   Dyazide [Hydrochlorothiazide W-Triamterene] Other (See Comments)    Unknown  reaction - might be nausea. Can take Lisinopril/HCTz now   Metformin And Related Other (See Comments)    Per patient was told not to have it because it cases " dehydration"     Social History   Tobacco Use   Smoking status: Former    Packs/day: 0.50    Years: 12.00    Additional pack years: 0.00    Total pack years: 6.00    Types: Cigarettes    Quit date: 1991    Years since quitting: 33.4   Smokeless tobacco: Never  Substance Use Topics   Alcohol use: Yes    Alcohol/week: 1.0 standard drink of alcohol    Types: 1 Cans of beer per week    Family History  Problem Relation Age of Onset   Diabetes Mellitus II Mother      Review of Systems  Musculoskeletal:  Positive for arthralgias.  All other systems reviewed and are negative.   Objective:  Physical Exam Constitutional:      General: She is not in acute distress.    Appearance: Normal appearance. She is not ill-appearing.  HENT:     Head: Normocephalic and atraumatic.     Right Ear: External ear normal.     Left Ear: External ear normal.     Nose: Nose normal.     Mouth/Throat:     Mouth: Mucous membranes are moist.     Pharynx: Oropharynx is clear.  Eyes:     Extraocular Movements: Extraocular movements intact.     Conjunctiva/sclera: Conjunctivae normal.  Cardiovascular:     Rate and Rhythm: Normal rate and regular rhythm.     Pulses: Normal pulses.     Heart sounds: Normal heart sounds.  Pulmonary:     Effort: Pulmonary effort is normal.     Breath sounds: Normal breath sounds.  Abdominal:     General: Bowel sounds are normal.     Palpations: Abdomen is soft.     Tenderness: There is no abdominal tenderness.  Musculoskeletal:        General: Tenderness present.     Cervical back: Normal range of motion and neck supple.     Comments: Right knee with tenderness to medial and lateral joint line.  Right knee ROM 5-110, left knee ROM 0-120.  Painful ROM due to pain.  Mild valgus deformity.  BLE appear grossly  neurovascularly intact.  Mildly antalgic gait.  No lesion over area of chief complaint.  Skin:    General: Skin is warm and dry.  Neurological:     Mental Status: She is alert and oriented to person, place, and time. Mental status is at baseline.  Psychiatric:        Mood and Affect: Mood normal.        Behavior: Behavior normal.     Vital signs in last 24 hours: @VSRANGES @  Labs:   Estimated body mass index is 39.53 kg/m as calculated from the following:   Height as of 07/05/22: 5\' 8"  (1.727 m).   Weight as of 07/05/22: 117.9 kg.   Imaging Review Plain radiographs demonstrate severe degenerative joint disease of the right knee(s). The overall alignment is mild valgus. The bone quality appears to be good for age and reported activity level.   Assessment/Plan:  End stage arthritis, right knee   The patient history, physical examination, clinical judgment of the provider and imaging studies are consistent with end stage degenerative joint disease of the right knee(s) and total knee arthroplasty is deemed medically necessary. The treatment options including medical management, injection therapy arthroscopy and arthroplasty were discussed at length. The risks and benefits of total knee arthroplasty were presented and reviewed. The risks due to aseptic loosening, infection, stiffness, patella tracking problems, thromboembolic complications and other imponderables were discussed. The patient acknowledged the explanation, agreed to proceed with the plan and consent was signed. Patient is being admitted for inpatient treatment for surgery, pain control, PT, OT, prophylactic antibiotics, VTE prophylaxis, progressive ambulation and ADL's and discharge planning. The patient is planning to be discharged home with outpatient PT.

## 2022-11-14 NOTE — H&P (View-Only) (Signed)
TOTAL KNEE ADMISSION H&P  Patient is being admitted for right total knee arthroplasty.  Subjective:  Chief Complaint:right knee pain.  HPI: Sara [name], 67 y.o. female, has a history of pain and functional disability in the right knee due to arthritis and has failed non-surgical conservative treatments for greater than 12 weeks to includeNSAID's and/or analgesics, activity modification, and home exercises/strengthening .  Onset of symptoms was gradual, starting 3 years ago with gradually worsening course since that time. The patient noted no past surgery on the right knee(s).  Patient currently rates pain in the right knee(s) at 7 out of 10 with activity. Patient has night pain, worsening of pain with activity and weight bearing, pain that interferes with activities of daily living, and pain with passive range of motion.  Patient has evidence of periarticular osteophytes and joint space narrowing by imaging studies.  There is no active infection.  Patient Active Problem List   Diagnosis Date Noted   Hypovolemic shock (HCC) 01/18/2018   Nausea and vomiting 01/18/2018   Diarrhea 01/18/2018   Hyponatremia 01/18/2018   Dyslipidemia associated with type 2 diabetes mellitus (HCC) 08/19/2016   Acute kidney failure (HCC) 08/18/2016   Hypokalemia 08/08/2016   Hypothyroidism 07/04/2011   HTN (hypertension) 07/04/2011   Past Medical History:  Diagnosis Date   High cholesterol    Hypertension    Hypothyroidism    Type II diabetes mellitus (HCC)     Past Surgical History:  Procedure Laterality Date   COLONOSCOPY  08/07/2016   DILATION AND CURETTAGE OF UTERUS  1990   ESOPHAGOGASTRODUODENOSCOPY  07/06/2011   Procedure: ESOPHAGOGASTRODUODENOSCOPY (EGD);  Surgeon: Patrick D Hung, MD;  Location: [facility];  Service: Endoscopy;  Laterality: N/A;   OOPHORECTOMY Right 1990s   "& cyst w/it"   TOTAL KNEE ARTHROPLASTY Left 07/03/2022   Procedure: TOTAL KNEE ARTHROPLASTY;  Surgeon: Marchwiany,  Daniel A, MD;  Location: [facility];  Service: Orthopedics;  Laterality: Left;   TOTAL THYROIDECTOMY  2006    Current Outpatient Medications  Medication Sig Dispense Refill Last Dose   acetaminophen (TYLENOL) 500 MG tablet Take 1,000-1,500 mg by mouth every 6 (six) hours as needed for headache or mild pain (pain).      amLODipine (NORVASC) 10 MG tablet Take 1 tablet (10 mg total) by mouth daily. Hold, discuss BP management with PCP (Patient taking differently: Take 10 mg by mouth daily.)      apixaban (ELIQUIS) 2.5 MG TABS tablet Take 1 tablet (2.5 mg total) by mouth 2 (two) times daily. 60 tablet 0    Ascorbic Acid (VITAMIN C) 1000 MG tablet Take 1,000 mg by mouth 2 (two) times daily.      atenolol (TENORMIN) 100 MG tablet Take 1 tablet (100 mg total) by mouth daily. Hold, discuss BP management with PCP (Patient taking differently: Take 100 mg by mouth daily.)  3    Calcium-Magnesium-Vitamin D (CITRACAL SLOW RELEASE PO) Take 1 tablet by mouth daily.      Cholecalciferol (VITAMIN D3) 25 MCG (1000 UT) CAPS Take 1,000 Units by mouth daily.      ferrous sulfate 324 MG TBEC Take 324 mg by mouth daily with breakfast.      Glucos-Chond-Sterol-Fish Oil (GLUCOSAMINE CHONDROITIN PLUS) CAPS Take 2 tablets by mouth daily.      KLOR-CON M10 10 MEQ tablet Take 10 mEq by mouth 2 (two) times daily.      latanoprost (XALATAN) 0.005 % ophthalmic solution Place 1 drop into both eyes at bedtime.        levothyroxine (SYNTHROID) 150 MCG tablet Take 150 mcg by mouth daily.      pantoprazole (PROTONIX) 40 MG tablet Take 40 mg by mouth daily.      simvastatin (ZOCOR) 20 MG tablet Take 20 mg by mouth at bedtime.       sitaGLIPtin (JANUVIA) 100 MG tablet Take 100 mg by mouth daily.      valsartan (DIOVAN) 40 MG tablet Take 40 mg by mouth daily.      No current facility-administered medications for this visit.   Allergies  Allergen Reactions   Dyazide [Hydrochlorothiazide W-Triamterene] Other (See Comments)    Unknown  reaction - might be nausea. Can take Lisinopril/HCTz now   Metformin And Related Other (See Comments)    Per patient was told not to have it because it cases " dehydration"     Social History   Tobacco Use   Smoking status: Former    Packs/day: 0.50    Years: 12.00    Additional pack years: 0.00    Total pack years: 6.00    Types: Cigarettes    Quit date: 1991    Years since quitting: 33.4   Smokeless tobacco: Never  Substance Use Topics   Alcohol use: Yes    Alcohol/week: 1.0 standard drink of alcohol    Types: 1 Cans of beer per week    Family History  Problem Relation Age of Onset   Diabetes Mellitus II Mother      Review of Systems  Musculoskeletal:  Positive for arthralgias.  All other systems reviewed and are negative.   Objective:  Physical Exam Constitutional:      General: She is not in acute distress.    Appearance: Normal appearance. She is not ill-appearing.  HENT:     Head: Normocephalic and atraumatic.     Right Ear: External ear normal.     Left Ear: External ear normal.     Nose: Nose normal.     Mouth/Throat:     Mouth: Mucous membranes are moist.     Pharynx: Oropharynx is clear.  Eyes:     Extraocular Movements: Extraocular movements intact.     Conjunctiva/sclera: Conjunctivae normal.  Cardiovascular:     Rate and Rhythm: Normal rate and regular rhythm.     Pulses: Normal pulses.     Heart sounds: Normal heart sounds.  Pulmonary:     Effort: Pulmonary effort is normal.     Breath sounds: Normal breath sounds.  Abdominal:     General: Bowel sounds are normal.     Palpations: Abdomen is soft.     Tenderness: There is no abdominal tenderness.  Musculoskeletal:        General: Tenderness present.     Cervical back: Normal range of motion and neck supple.     Comments: Right knee with tenderness to medial and lateral joint line.  Right knee ROM 5-110, left knee ROM 0-120.  Painful ROM due to pain.  Mild valgus deformity.  BLE appear grossly  neurovascularly intact.  Mildly antalgic gait.  No lesion over area of chief complaint.  Skin:    General: Skin is warm and dry.  Neurological:     Mental Status: She is alert and oriented to person, place, and time. Mental status is at baseline.  Psychiatric:        Mood and Affect: Mood normal.        Behavior: Behavior normal.     Vital signs in last 24 hours: @VSRANGES@    Labs:   Estimated body mass index is 39.53 kg/m as calculated from the following:   Height as of 07/05/22: 5' 8" (1.727 m).   Weight as of 07/05/22: 117.9 kg.   Imaging Review Plain radiographs demonstrate severe degenerative joint disease of the right knee(s). The overall alignment is mild valgus. The bone quality appears to be good for age and reported activity level.   Assessment/Plan:  End stage arthritis, right knee   The patient history, physical examination, clinical judgment of the provider and imaging studies are consistent with end stage degenerative joint disease of the right knee(s) and total knee arthroplasty is deemed medically necessary. The treatment options including medical management, injection therapy arthroscopy and arthroplasty were discussed at length. The risks and benefits of total knee arthroplasty were presented and reviewed. The risks due to aseptic loosening, infection, stiffness, patella tracking problems, thromboembolic complications and other imponderables were discussed. The patient acknowledged the explanation, agreed to proceed with the plan and consent was signed. Patient is being admitted for inpatient treatment for surgery, pain control, PT, OT, prophylactic antibiotics, VTE prophylaxis, progressive ambulation and ADL's and discharge planning. The patient is planning to be discharged home with outpatient PT.    

## 2022-11-14 NOTE — Patient Instructions (Signed)
DUE TO COVID-19 ONLY TWO VISITORS  (aged 67 and older)  ARE ALLOWED TO COME WITH YOU AND STAY IN THE WAITING ROOM ONLY DURING PRE OP AND PROCEDURE.   **NO VISITORS ARE ALLOWED IN THE SHORT STAY AREA OR RECOVERY ROOM!!**  IF YOU WILL BE ADMITTED INTO THE HOSPITAL YOU ARE ALLOWED ONLY FOUR SUPPORT PEOPLE DURING VISITATION HOURS ONLY (7 AM -8PM)   The support person(s) must pass our screening, gel in and out, and wear a mask at all times, including in the patient's room. Patients must also wear a mask when staff or their support person are in the room. Visitors GUEST BADGE MUST BE WORN VISIBLY  One adult visitor may remain with you overnight and MUST be in the room by 8 P.M.     Your procedure is scheduled on: 11/27/22   Report to [facility] Main Entrance    Report to admitting at : 5:15 AM   Call this number if you have problems the morning of surgery [phone]   Do not eat food :After Midnight.   After Midnight you may have the following liquids until : 4:30 AM DAY OF SURGERY  Water Black Coffee (sugar ok, NO MILK/CREAM OR CREAMERS)  Tea (sugar ok, NO MILK/CREAM OR CREAMERS) regular and decaf                             Plain Jell-O (NO RED)                                           Fruit ices (not with fruit pulp, NO RED)                                     Popsicles (NO RED)                                                                  Juice: apple, WHITE grape, WHITE cranberry Sports drinks like Gatorade (NO RED)   The day of surgery:  Drink ONE (1) Pre-Surgery Clear G2 at : 4:30 AM the morning of surgery. Drink in one sitting. Do not sip.  This drink was given to you during your hospital  pre-op appointment visit. Nothing else to drink after completing the  Pre-Surgery Clear Ensure or G2.          If you have questions, please contact your surgeon's office.    Oral Hygiene is also important to reduce your risk of infection.                                     Remember - BRUSH YOUR TEETH THE MORNING OF SURGERY WITH YOUR REGULAR TOOTHPASTE  DENTURES WILL BE REMOVED PRIOR TO SURGERY PLEASE DO NOT APPLY "Poly grip" OR ADHESIVES!!!   Do NOT smoke after Midnight   Take these medicines the morning of surgery with A SIP OF WATER: atenolol,amlodipine,levothyroxine,pantoprazole.Tylenol as needed.  How to Manage  Your Diabetes Before and After Surgery  Why is it important to control my blood sugar before and after surgery? Improving blood sugar levels before and after surgery helps healing and can limit problems. A way of improving blood sugar control is eating a healthy diet by:  Eating less sugar and carbohydrates  Increasing activity/exercise  Talking with your doctor about reaching your blood sugar goals High blood sugars (greater than 180 mg/dL) can raise your risk of infections and slow your recovery, so you will need to focus on controlling your diabetes during the weeks before surgery. Make sure that the doctor who takes care of your diabetes knows about your planned surgery including the date and location.  How do I manage my blood sugar before surgery? Check your blood sugar at least 4 times a day, starting 2 days before surgery, to make sure that the level is not too high or low. Check your blood sugar the morning of your surgery when you wake up and every 2 hours until you get to the Short Stay unit. If your blood sugar is less than 70 mg/dL, you will need to treat for low blood sugar: Do not take insulin. Treat a low blood sugar (less than 70 mg/dL) with  cup of clear juice (cranberry or apple), 4 glucose tablets, OR glucose gel. Recheck blood sugar in 15 minutes after treatment (to make sure it is greater than 70 mg/dL). If your blood sugar is not greater than 70 mg/dL on recheck, call [phone] for further instructions. Report your blood sugar to the short stay nurse when you get to Short Stay.  If you are admitted to the hospital  after surgery: Your blood sugar will be checked by the staff and you will probably be given insulin after surgery (instead of oral diabetes medicines) to make sure you have good blood sugar levels. The goal for blood sugar control after surgery is 80-180 mg/dL.   WHAT DO I DO ABOUT MY DIABETES MEDICATION?  Do not take oral diabetes medicines (pills) the morning of surgery.  THE DAY BEFORE SURGERY, take metformin as usual      THE MORNING OF SURGERY, DO NOT TAKE ANY ORAL DIABETIC MEDICATIONS DAY OF YOUR SURGERY  DO NOT TAKE THE FOLLOWING 7 DAYS PRIOR TO SURGERY: Ozempic, Wegovy, Rybelsus (Semaglutide), Byetta (exenatide), Bydureon (exenatide ER), Victoza, Saxenda (liraglutide), or Trulicity (dulaglutide) Mounjaro (Tirzepatide) Adlyxin (Lixisenatide), Polyethylene Glycol Loxenatide.  Bring CPAP mask and tubing day of surgery.                              You may not have any metal on your body including hair pins, jewelry, and body piercing             Do not wear make-up, lotions, powders, perfumes/cologne, or deodorant  Do not wear nail polish including gel and S&S, artificial/acrylic nails, or any other type of covering on natural nails including finger and toenails. If you have artificial nails, gel coating, etc. that needs to be removed by a nail salon please have this removed prior to surgery or surgery may need to be canceled/ delayed if the surgeon/ anesthesia feels like they are unable to be safely monitored.   Do not shave  48 hours prior to surgery.    Do not bring valuables to the hospital. [facility] IS NOT             RESPONSIBLE  FOR VALUABLES.   Contacts, glasses, or bridgework may not be worn into surgery.   Bring small overnight bag day of surgery.   DO NOT BRING YOUR HOME MEDICATIONS TO THE HOSPITAL. PHARMACY WILL DISPENSE MEDICATIONS LISTED ON YOUR MEDICATION LIST TO YOU DURING YOUR ADMISSION IN THE HOSPITAL!    Patients discharged on the day of surgery will not  be allowed to drive home.  Someone NEEDS to stay with you for the first 24 hours after anesthesia.   Special Instructions: Bring a copy of your healthcare power of attorney and living will documents         the day of surgery if you haven't scanned them before.              Please read over the following fact sheets you were given: IF YOU HAVE QUESTIONS ABOUT YOUR PRE-OP INSTRUCTIONS PLEASE CALL [phone]      Pre-operative 5 CHG Bath Instructions   You can play a key role in reducing the risk of infection after surgery. Your skin needs to be as free of germs as possible. You can reduce the number of germs on your skin by washing with CHG (chlorhexidine gluconate) soap before surgery. CHG is an antiseptic soap that kills germs and continues to kill germs even after washing.   DO NOT use if you have an allergy to chlorhexidine/CHG or antibacterial soaps. If your skin becomes reddened or irritated, stop using the CHG and notify one of our RNs at : [phone].   Please shower with the CHG soap starting 4 days before surgery using the following schedule:     Please keep in mind the following:  DO NOT shave, including legs and underarms, starting the day of your first shower.   You may shave your face at any point before/day of surgery.  Place clean sheets on your bed the day you start using CHG soap. Use a clean washcloth (not used since being washed) for each shower. DO NOT sleep with pets once you start using the CHG.   CHG Shower Instructions:  If you choose to wash your hair and private area, wash first with your normal shampoo/soap.  After you use shampoo/soap, rinse your hair and body thoroughly to remove shampoo/soap residue.  Turn the water OFF and apply about 3 tablespoons (45 ml) of CHG soap to a CLEAN washcloth.  Apply CHG soap ONLY FROM YOUR NECK DOWN TO YOUR TOES (washing for 3-5 minutes)  DO NOT use CHG soap on face, private areas, open wounds, or sores.  Pay special  attention to the area where your surgery is being performed.  If you are having back surgery, having someone wash your back for you may be helpful. Wait 2 minutes after CHG soap is applied, then you may rinse off the CHG soap.  Pat dry with a clean towel  Put on clean clothes/pajamas   If you choose to wear lotion, please use ONLY the CHG-compatible lotions on the back of this paper.     Additional instructions for the day of surgery: DO NOT APPLY any lotions, deodorants, cologne, or perfumes.   Put on clean/comfortable clothes.  Brush your teeth.  Ask your nurse before applying any prescription medications to the skin.      CHG Compatible Lotions   Aveeno Moisturizing lotion  Cetaphil Moisturizing Cream  Cetaphil Moisturizing Lotion  Clairol Herbal Essence Moisturizing Lotion, Dry Skin  Clairol Herbal Essence Moisturizing Lotion, Extra Dry Skin  Clairol  Herbal Essence Moisturizing Lotion, Normal Skin  Curel Age Defying Therapeutic Moisturizing Lotion with Alpha Hydroxy  Curel Extreme Care Body Lotion  Curel Soothing Hands Moisturizing Hand Lotion  Curel Therapeutic Moisturizing Cream, Fragrance-Free  Curel Therapeutic Moisturizing Lotion, Fragrance-Free  Curel Therapeutic Moisturizing Lotion, Original Formula  Eucerin Daily Replenishing Lotion  Eucerin Dry Skin Therapy Plus Alpha Hydroxy Crme  Eucerin Dry Skin Therapy Plus Alpha Hydroxy Lotion  Eucerin Original Crme  Eucerin Original Lotion  Eucerin Plus Crme Eucerin Plus Lotion  Eucerin TriLipid Replenishing Lotion  Keri Anti-Bacterial Hand Lotion  Keri Deep Conditioning Original Lotion Dry Skin Formula Softly Scented  Keri Deep Conditioning Original Lotion, Fragrance Free Sensitive Skin Formula  Keri Lotion Fast Absorbing Fragrance Free Sensitive Skin Formula  Keri Lotion Fast Absorbing Softly Scented Dry Skin Formula  Keri Original Lotion  Keri Skin Renewal Lotion Keri Silky Smooth Lotion  Keri Silky Smooth  Sensitive Skin Lotion  Nivea Body Creamy Conditioning Oil  Nivea Body Extra Enriched Lotion  Nivea Body Original Lotion  Nivea Body Sheer Moisturizing Lotion Nivea Crme  Nivea Skin Firming Lotion  NutraDerm 30 Skin Lotion  NutraDerm Skin Lotion  NutraDerm Therapeutic Skin Cream  NutraDerm Therapeutic Skin Lotion  ProShield Protective Hand Cream  Provon moisturizing lotion   Incentive Spirometer  An incentive spirometer is a tool that can help keep your lungs clear and active. This tool measures how well you are filling your lungs with each breath. Taking long deep breaths may help reverse or decrease the chance of developing breathing (pulmonary) problems (especially infection) following: A long period of time when you are unable to move or be active. BEFORE THE PROCEDURE  If the spirometer includes an indicator to show your best effort, your nurse or respiratory therapist will set it to a desired goal. If possible, sit up straight or lean slightly forward. Try not to slouch. Hold the incentive spirometer in an upright position. INSTRUCTIONS FOR USE  Sit on the edge of your bed if possible, or sit up as far as you can in bed or on a chair. Hold the incentive spirometer in an upright position. Breathe out normally. Place the mouthpiece in your mouth and seal your lips tightly around it. Breathe in slowly and as deeply as possible, raising the piston or the ball toward the top of the column. Hold your breath for 3-5 seconds or for as long as possible. Allow the piston or ball to fall to the bottom of the column. Remove the mouthpiece from your mouth and breathe out normally. Rest for a few seconds and repeat Steps 1 through 7 at least 10 times every 1-2 hours when you are awake. Take your time and take a few normal breaths between deep breaths. The spirometer may include an indicator to show your best effort. Use the indicator as a goal to work toward during each repetition. After each  set of 10 deep breaths, practice coughing to be sure your lungs are clear. If you have an incision (the cut made at the time of surgery), support your incision when coughing by placing a pillow or rolled up towels firmly against it. Once you are able to get out of bed, walk around indoors and cough well. You may stop using the incentive spirometer when instructed by your caregiver.  RISKS AND COMPLICATIONS Take your time so you do not get dizzy or light-headed. If you are in pain, you may need to take or ask for pain medication before doing incentive  spirometry. It is harder to take a deep breath if you are having pain. AFTER USE Rest and breathe slowly and easily. It can be helpful to keep track of a log of your progress. Your caregiver can provide you with a simple table to help with this. If you are using the spirometer at home, follow these instructions: SEEK MEDICAL CARE IF:  You are having difficultly using the spirometer. You have trouble using the spirometer as often as instructed. Your pain medication is not giving enough relief while using the spirometer. You develop fever of 100.5 F (38.1 C) or higher. SEEK IMMEDIATE MEDICAL CARE IF:  You cough up bloody sputum that had not been present before. You develop fever of 102 F (38.9 C) or greater. You develop worsening pain at or near the incision site. MAKE SURE YOU:  Understand these instructions. Will watch your condition. Will get help right away if you are not doing well or get worse. Document Released: 10/23/2006 Document Revised: 09/04/2011 Document Reviewed: 12/24/2006 [facility] Patient Information 2014 [place], [place].   ________________________________________________________________________

## 2022-11-15 ENCOUNTER — Other Ambulatory Visit: Payer: Self-pay

## 2022-11-15 ENCOUNTER — Encounter (HOSPITAL_COMMUNITY): Payer: Self-pay

## 2022-11-15 ENCOUNTER — Encounter (HOSPITAL_COMMUNITY)
Admission: RE | Admit: 2022-11-15 | Discharge: 2022-11-15 | Disposition: A | Discharge status: Home / Self Care | Payer: Medicare Other | Source: Ambulatory Visit | Attending: Orthopedic Surgery | Admitting: Orthopedic Surgery

## 2022-11-15 VITALS — BP 154/88 | HR 55 | Temp 98.1°F | Ht 68.0 in | Wt 257.0 lb

## 2022-11-15 DIAGNOSIS — G8929 Other chronic pain: Secondary | ICD-10-CM | POA: Insufficient documentation

## 2022-11-15 DIAGNOSIS — Z01812 Encounter for preprocedural laboratory examination: Secondary | ICD-10-CM | POA: Diagnosis not present

## 2022-11-15 DIAGNOSIS — M25561 Pain in right knee: Secondary | ICD-10-CM | POA: Insufficient documentation

## 2022-11-15 DIAGNOSIS — Z01818 Encounter for other preprocedural examination: Secondary | ICD-10-CM

## 2022-11-15 DIAGNOSIS — E119 Type 2 diabetes mellitus without complications: Secondary | ICD-10-CM | POA: Diagnosis not present

## 2022-11-15 HISTORY — DX: Malignant (primary) neoplasm, unspecified: C80.1

## 2022-11-15 HISTORY — DX: Unspecified osteoarthritis, unspecified site: M19.90

## 2022-11-15 LAB — CBC WITH DIFFERENTIAL/PLATELET
Abs Immature Granulocytes: 0.01 10*3/uL (ref 0.00–0.07)
Basophils Absolute: 0 10*3/uL (ref 0.0–0.1)
Basophils Relative: 1 %
Eosinophils Absolute: 0.1 10*3/uL (ref 0.0–0.5)
Eosinophils Relative: 1 %
HCT: 43.5 % (ref 36.0–46.0)
Hemoglobin: 13.6 g/dL (ref 12.0–15.0)
Immature Granulocytes: 0 %
Lymphocytes Relative: 49 %
Lymphs Abs: 3.2 10*3/uL (ref 0.7–4.0)
MCH: 26.7 pg (ref 26.0–34.0)
MCHC: 31.3 g/dL (ref 30.0–36.0)
MCV: 85.3 fL (ref 80.0–100.0)
Monocytes Absolute: 0.6 10*3/uL (ref 0.1–1.0)
Monocytes Relative: 10 %
Neutro Abs: 2.5 10*3/uL (ref 1.7–7.7)
Neutrophils Relative %: 39 %
Platelets: 300 10*3/uL (ref 150–400)
RBC: 5.1 MIL/uL (ref 3.87–5.11)
RDW: 15.9 % — ABNORMAL HIGH (ref 11.5–15.5)
WBC: 6.5 10*3/uL (ref 4.0–10.5)
nRBC: 0 % (ref 0.0–0.2)

## 2022-11-15 LAB — COMPREHENSIVE METABOLIC PANEL
ALT: 15 U/L (ref 0–44)
AST: 15 U/L (ref 15–41)
Albumin: 4.3 g/dL (ref 3.5–5.0)
Alkaline Phosphatase: 91 U/L (ref 38–126)
Anion gap: 10 (ref 5–15)
BUN: 16 mg/dL (ref 8–23)
CO2: 25 mmol/L (ref 22–32)
Calcium: 9.6 mg/dL (ref 8.9–10.3)
Chloride: 102 mmol/L (ref 98–111)
Creatinine, Ser: 0.77 mg/dL (ref 0.44–1.00)
GFR, Estimated: >60 mL/min (ref >60)
Glucose, Bld: 132 mg/dL — ABNORMAL HIGH (ref 70–99)
Potassium: 5 mmol/L (ref 3.5–5.1)
Sodium: 137 mmol/L (ref 135–145)
Total Bilirubin: 0.6 mg/dL (ref 0.3–1.2)
Total Protein: 8.2 g/dL — ABNORMAL HIGH (ref 6.5–8.1)

## 2022-11-15 LAB — GLUCOSE, CAPILLARY: Glucose-Capillary: 145 mg/dL — ABNORMAL HIGH (ref 70–99)

## 2022-11-15 LAB — TYPE AND SCREEN
ABO/RH(D): B POS
Antibody Screen: NEGATIVE

## 2022-11-15 LAB — HEMOGLOBIN A1C
Hgb A1c MFr Bld: 6.1 % — ABNORMAL HIGH (ref 4.8–5.6)
Mean Plasma Glucose: 128.37 mg/dL

## 2022-11-15 LAB — SURGICAL PCR SCREEN
MRSA, PCR: NEGATIVE
Staphylococcus aureus: NEGATIVE

## 2022-11-15 NOTE — Progress Notes (Addendum)
For Short Stay: COVID SWAB appointment date:  Bowel Prep reminder:   For Anesthesia: PCP - Dr. Renford Dills. Clearance: 09/11/22 Cardiologist - N/A  Chest x-ray - 07/05/22 EKG - 06/22/22 Stress Test -  ECHO -  Cardiac Cath -  Pacemaker/ICD device last checked: Pacemaker orders received: Device Rep notified:  Spinal Cord Stimulator: N/A  Sleep Study - N/A CPAP -   Fasting Blood Sugar - 98 - 115 Checks Blood Sugar ____1_ times a day Date and result of last Hgb A1c- 6.2: 06/22/22  Last dose of GLP1 agonist- N/A GLP1 instructions:   Last dose of SGLT-2 inhibitors- N/A SGLT-2 instructions:   Blood Thinner Instructions: Aspirin Instructions: Will be held 5 days before surgery Last Dose:  Activity level: Can go up a flight of stairs and activities of daily living without stopping and without chest pain and/or shortness of breath   Able to exercise without chest pain and/or shortness of breath  Anesthesia review: Hx: DIA,HTN  Patient denies shortness of breath, fever, cough and chest pain at PAT appointment   Patient verbalized understanding of instructions that were given to them at the PAT appointment. Patient was also instructed that they will need to review over the PAT instructions again at home before surgery.

## 2022-11-25 NOTE — Anesthesia Preprocedure Evaluation (Signed)
Anesthesia Evaluation  Patient identified by MRN, date of birth, ID band Patient awake    Reviewed: Allergy & Precautions, NPO status , Patient's Chart, lab work & pertinent test results  History of Anesthesia Complications Negative for: history of anesthetic complications  Airway Mallampati: II  TM Distance: >3 FB Neck ROM: Full    Dental  (+) Dental Advisory Given   Pulmonary neg shortness of breath, neg sleep apnea, neg COPD, neg recent URI, former smoker   Pulmonary exam normal breath sounds clear to auscultation       Cardiovascular hypertension (amlodipine, atenolol, valsartan), Pt. on medications and Pt. on home beta blockers (-) angina (-) Past MI, (-) Cardiac Stents and (-) CABG (-) dysrhythmias  Rhythm:Regular Rate:Normal  HLD   Neuro/Psych negative neurological ROS     GI/Hepatic Neg liver ROS,GERD  Medicated,,  Endo/Other  diabetes (Hgb A1c 6.1), Well Controlled, Type 2, Oral Hypoglycemic AgentsHypothyroidism    Renal/GU negative Renal ROS     Musculoskeletal  (+) Arthritis ,    Abdominal  (+) + obese  Peds  Hematology negative hematology ROS (+)   Anesthesia Other Findings K 5.0  Reproductive/Obstetrics                             Anesthesia Physical Anesthesia Plan  ASA: 3  Anesthesia Plan: Spinal and MAC   Post-op Pain Management: Regional block* and Tylenol PO (pre-op)*   Induction: Intravenous  PONV Risk Score and Plan: 2 and Ondansetron, Dexamethasone, Propofol infusion and Treatment may vary due to age or medical condition  Airway Management Planned: Natural Airway and Simple Face Mask  Additional Equipment:   Intra-op Plan:   Post-operative Plan: Extubation in OR  Informed Consent: I have reviewed the patients History and Physical, chart, labs and discussed the procedure including the risks, benefits and alternatives for the proposed anesthesia with the  patient or authorized representative who has indicated his/her understanding and acceptance.     Dental advisory given  Plan Discussed with: CRNA and Anesthesiologist  Anesthesia Plan Comments: (Discussed potential risks of nerve blocks including, but not limited to, infection, bleeding, nerve damage, seizures, pneumothorax, respiratory depression, and potential failure of the block. Alternatives to nerve blocks discussed. All questions answered.  I have discussed risks of neuraxial anesthesia including but not limited to infection, bleeding, nerve injury, back pain, headache, seizures, and failure of block. Patient denies bleeding disorders and is not currently anticoagulated. Labs have been reviewed. Risks and benefits discussed. All patient's questions answered.   Discussed with patient risks of MAC including, but not limited to, minor pain or discomfort, hearing people in the room, and possible need for backup general anesthesia. Risks for general anesthesia also discussed including, but not limited to, sore throat, hoarse voice, chipped/damaged teeth, injury to vocal cords, nausea and vomiting, allergic reactions, lung infection, heart attack, stroke, and death. All questions answered. )        Anesthesia Quick Evaluation

## 2022-11-27 ENCOUNTER — Ambulatory Visit (HOSPITAL_BASED_OUTPATIENT_CLINIC_OR_DEPARTMENT_OTHER): Payer: Medicare Other | Admitting: Anesthesiology

## 2022-11-27 ENCOUNTER — Encounter (HOSPITAL_COMMUNITY)
Admission: RE | Disposition: A | Discharge status: Home / Self Care | Payer: Self-pay | Source: Ambulatory Visit | Attending: Orthopedic Surgery

## 2022-11-27 ENCOUNTER — Other Ambulatory Visit: Payer: Self-pay

## 2022-11-27 ENCOUNTER — Ambulatory Visit (HOSPITAL_COMMUNITY)
Admission: RE | Admit: 2022-11-27 | Discharge: 2022-11-27 | Disposition: A | Discharge status: Home / Self Care | Payer: Medicare Other | Source: Ambulatory Visit | Attending: Orthopedic Surgery | Admitting: Orthopedic Surgery

## 2022-11-27 ENCOUNTER — Ambulatory Visit (HOSPITAL_COMMUNITY): Payer: Medicare Other | Admitting: Anesthesiology

## 2022-11-27 ENCOUNTER — Ambulatory Visit (HOSPITAL_COMMUNITY): Payer: Medicare Other

## 2022-11-27 ENCOUNTER — Encounter (HOSPITAL_COMMUNITY): Payer: Self-pay | Admitting: Orthopedic Surgery

## 2022-11-27 DIAGNOSIS — E119 Type 2 diabetes mellitus without complications: Secondary | ICD-10-CM | POA: Diagnosis not present

## 2022-11-27 DIAGNOSIS — M1711 Unilateral primary osteoarthritis, right knee: Secondary | ICD-10-CM | POA: Insufficient documentation

## 2022-11-27 DIAGNOSIS — Z7984 Long term (current) use of oral hypoglycemic drugs: Secondary | ICD-10-CM | POA: Diagnosis not present

## 2022-11-27 DIAGNOSIS — E039 Hypothyroidism, unspecified: Secondary | ICD-10-CM | POA: Diagnosis not present

## 2022-11-27 DIAGNOSIS — Z87891 Personal history of nicotine dependence: Secondary | ICD-10-CM

## 2022-11-27 DIAGNOSIS — I10 Essential (primary) hypertension: Secondary | ICD-10-CM | POA: Diagnosis not present

## 2022-11-27 DIAGNOSIS — G8918 Other acute postprocedural pain: Secondary | ICD-10-CM | POA: Diagnosis not present

## 2022-11-27 DIAGNOSIS — Z96651 Presence of right artificial knee joint: Secondary | ICD-10-CM | POA: Diagnosis not present

## 2022-11-27 DIAGNOSIS — Z471 Aftercare following joint replacement surgery: Secondary | ICD-10-CM | POA: Diagnosis not present

## 2022-11-27 HISTORY — PX: TOTAL KNEE ARTHROPLASTY: SHX125

## 2022-11-27 LAB — GLUCOSE, CAPILLARY
Glucose-Capillary: 128 mg/dL — ABNORMAL HIGH (ref 70–99)
Glucose-Capillary: 166 mg/dL — ABNORMAL HIGH (ref 70–99)

## 2022-11-27 LAB — ABO/RH: ABO/RH(D): B POS

## 2022-11-27 SURGERY — ARTHROPLASTY, KNEE, TOTAL
Anesthesia: Monitor Anesthesia Care | Site: Knee | Laterality: Right

## 2022-11-27 MED ORDER — CEFAZOLIN SODIUM-DEXTROSE 2-4 GM/100ML-% IV SOLN
INTRAVENOUS | Status: AC
  Filled 2022-11-27: qty 100

## 2022-11-27 MED ORDER — ACETAMINOPHEN 500 MG PO TABS: 1000.0000 mg | ORAL_TABLET | Freq: Three times a day (TID) | ORAL | 0 refills | Status: AC | PRN

## 2022-11-27 MED ORDER — POLYETHYLENE GLYCOL 3350 17 G PO PACK: 17.0000 g | PACK | Freq: Every day | ORAL | 0 refills | Status: DC

## 2022-11-27 MED ORDER — ORAL CARE MOUTH RINSE: 15.0000 mL | Freq: Once | OROMUCOSAL | Status: AC

## 2022-11-27 MED ORDER — FENTANYL CITRATE PF 50 MCG/ML IJ SOSY: 25.0000 ug | PREFILLED_SYRINGE | INTRAMUSCULAR | Status: DC | PRN

## 2022-11-27 MED ORDER — DEXMEDETOMIDINE HCL IN NACL 80 MCG/20ML IV SOLN
INTRAVENOUS | Status: DC | PRN
  Administered 2022-11-27: 8 ug via INTRAVENOUS

## 2022-11-27 MED ORDER — MIDAZOLAM HCL 2 MG/2ML IJ SOLN
INTRAMUSCULAR | Status: AC
  Filled 2022-11-27: qty 2

## 2022-11-27 MED ORDER — BUPIVACAINE LIPOSOME 1.3 % IJ SUSP: 20.0000 mL | Freq: Once | INTRAMUSCULAR | Status: DC

## 2022-11-27 MED ORDER — DEXAMETHASONE SODIUM PHOSPHATE 10 MG/ML IJ SOLN: 8.0000 mg | Freq: Once | INTRAMUSCULAR | Status: DC

## 2022-11-27 MED ORDER — ROPIVACAINE HCL 5 MG/ML IJ SOLN
INTRAMUSCULAR | Status: DC | PRN
  Administered 2022-11-27: 20 mL via PERINEURAL

## 2022-11-27 MED ORDER — KETAMINE HCL 50 MG/5ML IJ SOSY
PREFILLED_SYRINGE | INTRAMUSCULAR | Status: AC
  Filled 2022-11-27: qty 5

## 2022-11-27 MED ORDER — OXYCODONE HCL 5 MG/5ML PO SOLN: 5.0000 mg | Freq: Once | ORAL | Status: DC | PRN

## 2022-11-27 MED ORDER — PROPOFOL 500 MG/50ML IV EMUL
INTRAVENOUS | Status: DC | PRN
  Administered 2022-11-27: 30 ug/kg/min via INTRAVENOUS
  Administered 2022-11-27: 180 mg via INTRAVENOUS
  Administered 2022-11-27 (×2): 30 mg via INTRAVENOUS

## 2022-11-27 MED ORDER — LACTATED RINGERS IV BOLUS
500.0000 mL | Freq: Once | INTRAVENOUS | Status: AC
  Administered 2022-11-27: 500 mL via INTRAVENOUS

## 2022-11-27 MED ORDER — CEFAZOLIN SODIUM-DEXTROSE 2-4 GM/100ML-% IV SOLN
2.0000 g | Freq: Four times a day (QID) | INTRAVENOUS | Status: DC
  Administered 2022-11-27: 2 g via INTRAVENOUS

## 2022-11-27 MED ORDER — WATER FOR IRRIGATION, STERILE IR SOLN
Status: DC | PRN
  Administered 2022-11-27: 2000 mL

## 2022-11-27 MED ORDER — FENTANYL CITRATE (PF) 100 MCG/2ML IJ SOLN
INTRAMUSCULAR | Status: DC | PRN
  Administered 2022-11-27 (×4): 50 ug via INTRAVENOUS

## 2022-11-27 MED ORDER — APIXABAN 2.5 MG PO TABS: 2.5000 mg | ORAL_TABLET | Freq: Two times a day (BID) | ORAL | 0 refills | Status: DC

## 2022-11-27 MED ORDER — MIDAZOLAM HCL 2 MG/2ML IJ SOLN
INTRAMUSCULAR | Status: DC | PRN
  Administered 2022-11-27: 2 mg via INTRAVENOUS

## 2022-11-27 MED ORDER — DEXAMETHASONE SODIUM PHOSPHATE 10 MG/ML IJ SOLN
INTRAMUSCULAR | Status: AC
  Filled 2022-11-27: qty 1

## 2022-11-27 MED ORDER — POVIDONE-IODINE 10 % EX SWAB: 2.0000 | Freq: Once | CUTANEOUS | Status: DC

## 2022-11-27 MED ORDER — ONDANSETRON HCL 4 MG/2ML IJ SOLN
INTRAMUSCULAR | Status: AC
  Filled 2022-11-27: qty 2

## 2022-11-27 MED ORDER — PROPOFOL 1000 MG/100ML IV EMUL
INTRAVENOUS | Status: AC
  Filled 2022-11-27: qty 100

## 2022-11-27 MED ORDER — OXYCODONE HCL 5 MG PO TABS: 5.0000 mg | ORAL_TABLET | ORAL | 0 refills | Status: AC | PRN

## 2022-11-27 MED ORDER — SODIUM CHLORIDE (PF) 0.9 % IJ SOLN
INTRAMUSCULAR | Status: AC
  Filled 2022-11-27: qty 10

## 2022-11-27 MED ORDER — PHENYLEPHRINE 80 MCG/ML (10ML) SYRINGE FOR IV PUSH (FOR BLOOD PRESSURE SUPPORT)
PREFILLED_SYRINGE | INTRAVENOUS | Status: AC
  Filled 2022-11-27: qty 10

## 2022-11-27 MED ORDER — DEXMEDETOMIDINE HCL IN NACL 80 MCG/20ML IV SOLN
INTRAVENOUS | Status: AC
  Filled 2022-11-27: qty 20

## 2022-11-27 MED ORDER — ONDANSETRON HCL 4 MG PO TABS: 4.0000 mg | ORAL_TABLET | Freq: Three times a day (TID) | ORAL | 0 refills | Status: AC | PRN

## 2022-11-27 MED ORDER — LACTATED RINGERS IV BOLUS: 250.0000 mL | Freq: Once | INTRAVENOUS | Status: DC

## 2022-11-27 MED ORDER — CHLORHEXIDINE GLUCONATE 0.12 % MT SOLN
15.0000 mL | Freq: Once | OROMUCOSAL | Status: AC
  Administered 2022-11-27: 15 mL via OROMUCOSAL

## 2022-11-27 MED ORDER — CELECOXIB 100 MG PO CAPS: 100.0000 mg | ORAL_CAPSULE | Freq: Two times a day (BID) | ORAL | 0 refills | Status: AC

## 2022-11-27 MED ORDER — SODIUM CHLORIDE 0.9% FLUSH
INTRAVENOUS | Status: DC | PRN
  Administered 2022-11-27: 60 mL

## 2022-11-27 MED ORDER — KETAMINE HCL 10 MG/ML IJ SOLN
INTRAMUSCULAR | Status: DC | PRN
  Administered 2022-11-27 (×2): 20 mg via INTRAVENOUS

## 2022-11-27 MED ORDER — SODIUM CHLORIDE 0.9 % IV SOLN: INTRAVENOUS | Status: DC

## 2022-11-27 MED ORDER — LACTATED RINGERS IV BOLUS
250.0000 mL | Freq: Once | INTRAVENOUS | Status: AC
  Administered 2022-11-27: 250 mL via INTRAVENOUS

## 2022-11-27 MED ORDER — SODIUM CHLORIDE 0.9 % IR SOLN
Status: DC | PRN
  Administered 2022-11-27: 3000 mL

## 2022-11-27 MED ORDER — EPHEDRINE 5 MG/ML INJ
INTRAVENOUS | Status: AC
  Filled 2022-11-27: qty 5

## 2022-11-27 MED ORDER — ISOPROPYL ALCOHOL 70 % SOLN
Status: DC | PRN
  Administered 2022-11-27: 1 via TOPICAL

## 2022-11-27 MED ORDER — CEFAZOLIN SODIUM-DEXTROSE 2-4 GM/100ML-% IV SOLN
2.0000 g | INTRAVENOUS | Status: AC
  Administered 2022-11-27: 2 g via INTRAVENOUS
  Filled 2022-11-27: qty 100

## 2022-11-27 MED ORDER — 0.9 % SODIUM CHLORIDE (POUR BTL) OPTIME
TOPICAL | Status: DC | PRN
  Administered 2022-11-27: 1000 mL

## 2022-11-27 MED ORDER — ONDANSETRON HCL 4 MG/2ML IJ SOLN
INTRAMUSCULAR | Status: DC | PRN
  Administered 2022-11-27: 4 mg via INTRAVENOUS

## 2022-11-27 MED ORDER — TRANEXAMIC ACID-NACL 1000-0.7 MG/100ML-% IV SOLN
1000.0000 mg | INTRAVENOUS | Status: AC
  Administered 2022-11-27: 1000 mg via INTRAVENOUS
  Filled 2022-11-27: qty 100

## 2022-11-27 MED ORDER — ACETAMINOPHEN 500 MG PO TABS
1000.0000 mg | ORAL_TABLET | Freq: Once | ORAL | Status: DC
  Filled 2022-11-27: qty 2

## 2022-11-27 MED ORDER — LIDOCAINE 2% (20 MG/ML) 5 ML SYRINGE
INTRAMUSCULAR | Status: DC | PRN
  Administered 2022-11-27: 60 mg via INTRAVENOUS

## 2022-11-27 MED ORDER — ACETAMINOPHEN 500 MG PO TABS: 1000.0000 mg | ORAL_TABLET | Freq: Once | ORAL | Status: DC

## 2022-11-27 MED ORDER — ISOPROPYL ALCOHOL 70 % SOLN
Status: AC
  Filled 2022-11-27: qty 960

## 2022-11-27 MED ORDER — FENTANYL CITRATE (PF) 100 MCG/2ML IJ SOLN
INTRAMUSCULAR | Status: AC
  Filled 2022-11-27: qty 2

## 2022-11-27 MED ORDER — SODIUM CHLORIDE (PF) 0.9 % IJ SOLN
INTRAMUSCULAR | Status: AC
  Filled 2022-11-27: qty 50

## 2022-11-27 MED ORDER — BUPIVACAINE LIPOSOME 1.3 % IJ SUSP
INTRAMUSCULAR | Status: DC | PRN
  Administered 2022-11-27: 20 mL

## 2022-11-27 MED ORDER — AMISULPRIDE (ANTIEMETIC) 5 MG/2ML IV SOLN
10.0000 mg | Freq: Once | INTRAVENOUS | Status: AC | PRN
  Administered 2022-11-27: 10 mg via INTRAVENOUS

## 2022-11-27 MED ORDER — LACTATED RINGERS IV SOLN: INTRAVENOUS | Status: DC

## 2022-11-27 MED ORDER — AMISULPRIDE (ANTIEMETIC) 5 MG/2ML IV SOLN
INTRAVENOUS | Status: AC
  Filled 2022-11-27: qty 4

## 2022-11-27 MED ORDER — LIDOCAINE HCL (PF) 2 % IJ SOLN
INTRAMUSCULAR | Status: AC
  Filled 2022-11-27: qty 20

## 2022-11-27 MED ORDER — BUPIVACAINE LIPOSOME 1.3 % IJ SUSP
INTRAMUSCULAR | Status: AC
  Filled 2022-11-27: qty 20

## 2022-11-27 MED ORDER — METHOCARBAMOL 500 MG PO TABS: 500.0000 mg | ORAL_TABLET | Freq: Three times a day (TID) | ORAL | 0 refills | Status: AC | PRN

## 2022-11-27 MED ORDER — OXYCODONE HCL 5 MG PO TABS: 5.0000 mg | ORAL_TABLET | Freq: Once | ORAL | Status: DC | PRN

## 2022-11-27 SURGICAL SUPPLY — 64 items
ADH SKN CLS APL DERMABOND .7 (GAUZE/BANDAGES/DRESSINGS) ×1
APL PRP STRL LF DISP 70% ISPRP (MISCELLANEOUS) ×2
BAG COUNTER SPONGE SURGICOUNT (BAG) IMPLANT
BAG SPNG CNTER NS LX DISP (BAG)
BLADE SAG 18X100X1.27 (BLADE) ×1 IMPLANT
BLADE SAW SAG 35X64 .89 (BLADE) ×1 IMPLANT
BNDG CMPR 5X3 CHSV STRCH STRL (GAUZE/BANDAGES/DRESSINGS) ×1
BNDG CMPR MED 10X6 ELC LF (GAUZE/BANDAGES/DRESSINGS) ×1
BNDG COHESIVE 3X5 TAN ST LF (GAUZE/BANDAGES/DRESSINGS) ×1 IMPLANT
BNDG ELASTIC 6X10 VLCR STRL LF (GAUZE/BANDAGES/DRESSINGS) ×1 IMPLANT
BOWL SMART MIX CTS (DISPOSABLE) ×1 IMPLANT
BSPLAT TIB 5D F CMNT STM RT (Knees) ×1 IMPLANT
CEMENT BONE R 1X40 (Cement) IMPLANT
CEMENT BONE REFOBACIN R1X40 US (Cement) IMPLANT
CHLORAPREP W/TINT 26 (MISCELLANEOUS) ×2 IMPLANT
COMP FEM KNEE NRW SZ9 RT (Joint) ×1 IMPLANT
COMPONENT FEM KNEE NRW SZ9 RT (Joint) IMPLANT
COVER SURGICAL LIGHT HANDLE (MISCELLANEOUS) ×1 IMPLANT
CUFF TOURN SGL QUICK 34 (TOURNIQUET CUFF) ×1
CUFF TRNQT CYL 34X4.125X (TOURNIQUET CUFF) ×1 IMPLANT
DERMABOND ADVANCED .7 DNX12 (GAUZE/BANDAGES/DRESSINGS) ×1 IMPLANT
DRAPE INCISE IOBAN 85X60 (DRAPES) ×1 IMPLANT
DRAPE SHEET LG 3/4 BI-LAMINATE (DRAPES) ×1 IMPLANT
DRAPE U-SHAPE 47X51 STRL (DRAPES) ×1 IMPLANT
DRESSING AQUACEL AG SP 3.5X10 (GAUZE/BANDAGES/DRESSINGS) ×1 IMPLANT
DRSG AQUACEL AG SP 3.5X10 (GAUZE/BANDAGES/DRESSINGS) ×1
ELECT REM PT RETURN 15FT ADLT (MISCELLANEOUS) ×1 IMPLANT
GAUZE SPONGE 4X4 12PLY STRL (GAUZE/BANDAGES/DRESSINGS) ×1 IMPLANT
GLOVE BIO SURGEON STRL SZ 6.5 (GLOVE) ×2 IMPLANT
GLOVE BIOGEL PI IND STRL 6.5 (GLOVE) ×1 IMPLANT
GLOVE BIOGEL PI IND STRL 8 (GLOVE) ×1 IMPLANT
GLOVE SURG ORTHO 8.0 STRL STRW (GLOVE) ×2 IMPLANT
GOWN STRL REUS W/ TWL XL LVL3 (GOWN DISPOSABLE) ×2 IMPLANT
GOWN STRL REUS W/TWL XL LVL3 (GOWN DISPOSABLE) ×2
HANDPIECE INTERPULSE COAX TIP (DISPOSABLE) ×1
HOLDER FOLEY CATH W/STRAP (MISCELLANEOUS) ×1 IMPLANT
HOOD PEEL AWAY T7 (MISCELLANEOUS) ×3 IMPLANT
INSERT TIBIA KNEE RIGHT 10 (Joint) IMPLANT
KIT TURNOVER KIT A (KITS) IMPLANT
MANIFOLD NEPTUNE II (INSTRUMENTS) ×1 IMPLANT
MARKER SKIN DUAL TIP RULER LAB (MISCELLANEOUS) ×1 IMPLANT
NS IRRIG 1000ML POUR BTL (IV SOLUTION) ×1 IMPLANT
PACK TOTAL KNEE CUSTOM (KITS) ×1 IMPLANT
PIN DRILL HDLS TROCAR 75 4PK (PIN) IMPLANT
SET HNDPC FAN SPRY TIP SCT (DISPOSABLE) ×1 IMPLANT
SOLUTION IRRIG SURGIPHOR (IV SOLUTION) IMPLANT
SPIKE FLUID TRANSFER (MISCELLANEOUS) ×1 IMPLANT
STEM POLY PAT PLY 35M KNEE (Knees) IMPLANT
STEM TIB ST PERS 14+30 (Stem) IMPLANT
STEM TIBIA 5 DEG SZ F R KNEE (Knees) IMPLANT
STRIP CLOSURE SKIN 1/2X4 (GAUZE/BANDAGES/DRESSINGS) ×1 IMPLANT
SUT MNCRL AB 3-0 PS2 18 (SUTURE) ×1 IMPLANT
SUT STRATAFIX 0 PDS 27 VIOLET (SUTURE) ×1
SUT STRATAFIX PDO 1 14 VIOLET (SUTURE) ×1
SUT STRATFX PDO 1 14 VIOLET (SUTURE) ×1
SUT VIC AB 2-0 CT2 27 (SUTURE) ×2 IMPLANT
SUTURE STRATFX 0 PDS 27 VIOLET (SUTURE) ×1 IMPLANT
SUTURE STRATFX PDO 1 14 VIOLET (SUTURE) ×1 IMPLANT
SYR 50ML LL SCALE MARK (SYRINGE) ×1 IMPLANT
TIBIA STEM 5 DEG SZ F R KNEE (Knees) ×1 IMPLANT
TRAY FOLEY MTR SLVR 14FR STAT (SET/KITS/TRAYS/PACK) IMPLANT
TUBE SUCTION HIGH CAP CLEAR NV (SUCTIONS) ×1 IMPLANT
UNDERPAD 30X36 HEAVY ABSORB (UNDERPADS AND DIAPERS) ×1 IMPLANT
WRAP KNEE MAXI GEL POST OP (GAUZE/BANDAGES/DRESSINGS) IMPLANT

## 2022-11-27 NOTE — Anesthesia Procedure Notes (Signed)
Anesthesia Regional Block: Adductor canal block   Pre-Anesthetic Checklist: , timeout performed,  Correct Patient, Correct Site, Correct Laterality,  Correct Procedure, Correct Position, site marked,  Risks and benefits discussed,  Surgical consent,  Pre-op evaluation,  At surgeon's request and post-op pain management  Laterality: Right  Prep: chloraprep       Needles:  Injection technique: Single-shot  Needle Type: Echogenic Stimulator Needle     Needle Length: 9cm  Needle Gauge: 21     Additional Needles:   Procedures:,,,, ultrasound used (permanent image in chart),,    Narrative:  Start time: 11/27/2022 7:05 AM End time: 11/27/2022 7:08 AM Injection made incrementally with aspirations every 5 mL.  Performed by: Personally  Anesthesiologist: Linton Rump, MD  Additional Notes: Discussed risks and benefits of nerve block including, but not limited to, prolonged and/or permanent nerve injury involving sensory and/or motor function. Monitors were applied and a time-out was performed. The nerve and associated structures were visualized under ultrasound guidance. After negative aspiration, local anesthetic was slowly injected around the nerve. There was no evidence of high pressure during the procedure. There were no paresthesias. VSS remained stable and the patient tolerated the procedure well.

## 2022-11-27 NOTE — Discharge Instructions (Signed)

## 2022-11-27 NOTE — Anesthesia Procedure Notes (Addendum)
Spinal  Patient location during procedure: OR Start time: 11/27/2022 7:35 AM End time: 11/27/2022 7:43 AM Reason for block: surgical anesthesia Staffing Performed: anesthesiologist  Anesthesiologist: Linton Rump, MD Resident/CRNA: Basilio Cairo, CRNA Performed by: Linton Rump, MD Authorized by: Linton Rump, MD   Preanesthetic Checklist Completed: patient identified, IV checked, site marked, risks and benefits discussed, surgical consent, monitors and equipment checked, pre-op evaluation and timeout performed Spinal Block Patient position: sitting Prep: DuraPrep Patient monitoring: blood pressure and continuous pulse ox Approach: midline Location: L3-4 Injection technique: single-shot Needle Needle type: Pencan  Needle gauge: 24 G Needle length: 9 cm Additional Notes Risks and benefits of neuraxial anesthesia including, but not limited to, infection, bleeding, local anesthetic toxicity, headache, hypotension, back pain, block failure, etc. were discussed with the patient. The patient expressed understanding and consented to the procedure. I confirmed that the patient has no bleeding disorders and is not taking blood thinners. I confirmed the patient's last platelet count with the nurse. Monitors were applied. A time-out was performed immediately prior to the procedure. Sterile technique was used throughout the whole procedure.   Unsuccessful attempt at spinal by both CRNA and MDA after multiple attempts. Decision made to convert to GA.

## 2022-11-27 NOTE — Transfer of Care (Signed)
Immediate Anesthesia Transfer of Care Note  Patient: Sara [name]  Procedure(s) Performed: Procedure(s): TOTAL KNEE ARTHROPLASTY (Right)  Patient Location: PACU  Anesthesia Type:General  Level of Consciousness: Alert, Awake, Oriented  Airway & Oxygen Therapy: Patient Spontanous Breathing  Post-op Assessment: Report given to RN  Post vital signs: Reviewed and stable  Last Vitals:  Vitals:   11/27/22 0601  BP: 132/73  Pulse: 61  Resp: 16  Temp: 36.8 C  SpO2: 97%    Complications: No apparent anesthesia complications

## 2022-11-27 NOTE — Evaluation (Signed)
Physical Therapy Evaluation Patient Details Name: Sara [name] MRN: [id number] DOB: 08-17-1955 Today's Date: 11/27/2022  History of Present Illness  Pt is a 67 yo female presenting to therapy s/p R TKA on 11/27/2022 due to failure of conservative measures. Pt underwent L TKA on 07/03/22. Pt PMH includes but is not limited to: HTN, AKI, hypothyroidism, and  DM II.  Clinical Impression    Sara [name] is a 67 y.o. female POD 0 s/p R TKA. Patient reports IND with mobility at baseline. Patient is now limited by functional impairments (see PT problem list below) and requires min guard and cues for transfers and gait with RW. Patient was able to ambulate 60 feet with RW and min guard and cues for safe walker management. Patient educated on safe sequencing for stair mobility, pain management, car transfers and fall risk prevention pt verbalized understanding. Patient instructed in exercises to facilitate ROM and circulation reviewed with HO provided. Patient will benefit from continued skilled PT interventions to address impairments and progress towards PLOF. Patient has met mobility goals at adequate level for discharge home with family support and OPPT 6/6; will continue to follow if pt continues acute stay to progress towards Mod I goals.      Recommendations for follow up therapy are one component of a multi-disciplinary discharge planning process, led by the attending physician.  Recommendations may be updated based on patient status, additional functional criteria and insurance authorization.  Follow Up Recommendations       Assistance Recommended at Discharge Intermittent Supervision/Assistance  Patient can return home with the following  A little help with walking and/or transfers;A little help with bathing/dressing/bathroom;Assistance with cooking/housework;Assist for transportation;Help with stairs or ramp for entrance    Equipment Recommendations None recommended by PT (pt reports DME  in home setting)  Recommendations for Other Services       Functional Status Assessment Patient has had a recent decline in their functional status and demonstrates the ability to make significant improvements in function in a reasonable and predictable amount of time.     Precautions / Restrictions Precautions Precautions: Knee;Fall Restrictions Weight Bearing Restrictions: No      Mobility  Bed Mobility Overal bed mobility: Needs Assistance Bed Mobility: Supine to Sit     Supine to sit: Min guard     General bed mobility comments: min cues    Transfers Overall transfer level: Needs assistance Equipment used: Rolling walker (2 wheels) Transfers: Sit to/from Stand Sit to Stand: Min guard           General transfer comment: min cues for proper UE placement    Ambulation/Gait Ambulation/Gait assistance: Min guard Gait Distance (Feet): 60 Feet Assistive device: Rolling walker (2 wheels) Gait Pattern/deviations: Step-to pattern, Antalgic Gait velocity: decreased        Stairs Stairs: Yes Stairs assistance: Min guard Stair Management: Two rails Number of Stairs: 3 General stair comments: cues for proper sequencing and technique pt encouraged to use step to pattern  Wheelchair Mobility    Modified Rankin (Stroke Patients Only)       Balance Overall balance assessment: Needs assistance Sitting-balance support: Feet supported Sitting balance-Leahy Scale: Good     Standing balance support: Bilateral upper extremity supported, During functional activity, Reliant on assistive device for balance Standing balance-Leahy Scale: Poor                               Pertinent  Vitals/Pain      Home Living Family/patient expects to be discharged to:: Private residence Living Arrangements: Spouse/significant other;Children Available Help at Discharge: Family;Available 24 hours/day Type of Home: House Home Access: Stairs to enter Entrance  Stairs-Rails: Right Entrance Stairs-Number of Steps: 3   Home Layout: Two level;Able to live on main level with bedroom/bathroom Home Equipment: Rolling Walker (2 wheels);Cane - single point;BSC/3in1;Shower seat;Grab bars - tub/shower;Grab bars - toilet      Prior Function Prior Level of Function : Independent/Modified Independent             Mobility Comments: IND with all ADLs, self care tasks, IADLs, driving       Hand Dominance        Extremity/Trunk Assessment        Lower Extremity Assessment Lower Extremity Assessment: RLE deficits/detail RLE Deficits / Details: ankle DF/PF 5/5; SLR , 10 degree lag RLE Sensation: WNL    Cervical / Trunk Assessment Cervical / Trunk Assessment:  (wfl)  Communication   Communication: No difficulties  Cognition Arousal/Alertness: Awake/alert Behavior During Therapy: WFL for tasks assessed/performed Overall Cognitive Status: Within Functional Limits for tasks assessed                                          General Comments      Exercises Total Joint Exercises Ankle Circles/Pumps: AROM, Both, 20 reps Quad Sets: AROM, Right, 5 reps Short Arc Quad: AROM, Right, 5 reps Heel Slides: AROM, Right, 5 reps Hip ABduction/ADduction: AROM, Right, 5 reps Straight Leg Raises: AROM, Right, 5 reps Knee Flexion: AROM, Right, 5 reps, Seated   Assessment/Plan    PT Assessment Patient needs continued PT services  PT Problem List Decreased strength;Decreased range of motion;Decreased activity tolerance;Decreased balance;Decreased mobility;Decreased coordination       PT Treatment Interventions DME instruction;Gait training;Stair training;Functional mobility training;Therapeutic activities;Therapeutic exercise;Balance training;Neuromuscular re-education;Patient/family education;Modalities    PT Goals (Current goals can be found in the Care Plan section)  Acute Rehab PT Goals Patient Stated Goal: to be able to climb  steps to second story PT Goal Formulation: With patient Time For Goal Achievement: 12/11/22 Potential to Achieve Goals: Good    Frequency       Co-evaluation               AM-PAC PT "6 Clicks" Mobility  Outcome Measure Help needed turning from your back to your side while in a flat bed without using bedrails?: A Little Help needed moving from lying on your back to sitting on the side of a flat bed without using bedrails?: A Little Help needed moving to and from a bed to a chair (including a wheelchair)?: A Little Help needed standing up from a chair using your arms (e.g., wheelchair or bedside chair)?: A Little Help needed to walk in hospital room?: A Little Help needed climbing 3-5 steps with a railing? : A Little 6 Click Score: 18    End of Session Equipment Utilized During Treatment: Gait belt Activity Tolerance: Patient tolerated treatment well Patient left: in chair;with call bell/phone within reach Nurse Communication: Mobility status;Other (comment) (pt readiness for d/c) PT Visit Diagnosis: Unsteadiness on feet (R26.81);Other abnormalities of gait and mobility (R26.89);Muscle weakness (generalized) (M62.81)    Time: 1610-9604 PT Time Calculation (min) (ACUTE ONLY): 39 min   Charges:   PT Evaluation $PT Eval Low Complexity: 1 Low PT Treatments $  Gait Training: 8-22 mins $Therapeutic Exercise: 8-22 mins        Johnny Bridge, PT Acute Rehab   Jacqualyn Posey 11/27/2022, 2:36 PM

## 2022-11-27 NOTE — Interval H&P Note (Signed)

## 2022-11-27 NOTE — Anesthesia Postprocedure Evaluation (Signed)
Anesthesia Post Note  Patient: Sara [name]  Procedure(s) Performed: TOTAL KNEE ARTHROPLASTY (Right: Knee)     Patient location during evaluation: PACU Anesthesia Type: General Level of consciousness: awake Pain management: pain level controlled Vital Signs Assessment: post-procedure vital signs reviewed and stable Respiratory status: spontaneous breathing, nonlabored ventilation and respiratory function stable Cardiovascular status: blood pressure returned to baseline and stable Postop Assessment: no apparent nausea or vomiting Anesthetic complications: no   No notable events documented.  Last Vitals:  Vitals:   11/27/22 1111 11/27/22 1145  BP: (!) 187/87 (!) 198/87  Pulse: 70 62  Resp: 20   Temp: (!) 36.4 C   SpO2: 96% 100%    Last Pain:  Vitals:   11/27/22 1145  TempSrc:   PainSc: 0-No pain                 Linton Rump

## 2022-11-27 NOTE — Anesthesia Procedure Notes (Signed)
Procedure Name: LMA Insertion Date/Time: 11/27/2022 8:01 AM  Performed by: Basilio Cairo, CRNAPre-anesthesia Checklist: Patient identified, Patient being monitored, Timeout performed, Emergency Drugs available and Suction available Patient Re-evaluated:Patient Re-evaluated prior to induction Oxygen Delivery Method: Circle system utilized Preoxygenation: Pre-oxygenation with 100% oxygen Induction Type: IV induction Ventilation: Mask ventilation without difficulty LMA: LMA inserted LMA Size: 4.0 Tube type: Oral Number of attempts: 1 Placement Confirmation: positive ETCO2 and breath sounds checked- equal and bilateral Tube secured with: Tape Dental Injury: Teeth and Oropharynx as per pre-operative assessment

## 2022-11-27 NOTE — Op Note (Signed)
DATE OF SURGERY:  11/27/2022 TIME: 9:56 AM  PATIENT NAME:  Gianne J Hanlon   AGE: 67 y.o.    PRE-OPERATIVE DIAGNOSIS: End-stage right knee osteoarthritis  POST-OPERATIVE DIAGNOSIS:  Same  PROCEDURE: Right total Knee Arthroplasty  SURGEON:  Wyat Infinger A Ahan Eisenberger, MD   ASSISTANT: Kathie Dike, PA-C, present and scrubbed throughout the case, critical for assistance with exposure, retraction, instrumentation, and closure.   OPERATIVE IMPLANTS:  Cemented Zimmer persona size 9 narrow femur, F tibial baseplate with 30 mm stem extension, 35 mm patella, 10 mm MC poly insert Implant Name Type Inv. Item Serial No. Manufacturer Lot No. LRB No. Used Action  CEMENT BONE REFOBACIN R1X40 [place] - [id number] Cement CEMENT BONE REFOBACIN R1X40 [place]  ZIMMER RECON(ORTH,TRAU,BIO,SG) [id number] Right 1 Implanted  CEMENT BONE REFOBACIN R1X40 [place] - [id number] Cement CEMENT BONE REFOBACIN R1X40 [place]  ZIMMER RECON(ORTH,TRAU,BIO,SG) [id number] Right 1 Implanted  STEM POLY PAT PLY 52M KNEE - [id number] Knees STEM POLY PAT PLY 52M KNEE  ZIMMER RECON(ORTH,TRAU,BIO,SG) [id number] Right 1 Implanted  STEM TIB ST PERS 14+30 - [id number] Stem STEM TIB ST PERS 14+30  ZIMMER RECON(ORTH,TRAU,BIO,SG) [id number] Right 1 Implanted  TIBIA STEM 5 DEG SZ F R KNEE - [id number] Knees TIBIA STEM 5 DEG SZ F R KNEE  ZIMMER RECON(ORTH,TRAU,BIO,SG) [id number] Right 1 Implanted  COMP FEM KNEE NRW SZ9 RT - [id number] Joint COMP FEM KNEE NRW SZ9 RT  ZIMMER RECON(ORTH,TRAU,BIO,SG) [id number] Right 1 Implanted  INSERT TIBIA KNEE RIGHT 10 - [id number] Joint INSERT TIBIA KNEE RIGHT 10  ZIMMER RECON(ORTH,TRAU,BIO,SG) [id number] Right 1 Implanted      PREOPERATIVE INDICATIONS:  ALIZ MUSGRAVE is a 67 y.o. year old female with end stage bone on bone degenerative arthritis of the knee who failed conservative treatment, including injections, antiinflammatories, activity modification, and assistive devices, and had significant impairment of their activities of  daily living, and elected for Total Knee Arthroplasty.   The risks, benefits, and alternatives were discussed at length including but not limited to the risks of infection, bleeding, nerve injury, stiffness, blood clots, the need for revision surgery, cardiopulmonary complications, among others, and they were willing to proceed.  ESTIMATED BLOOD LOSS: 50cc  OPERATIVE DESCRIPTION:   Once adequate anesthesia was induced, preoperative antibiotics, 2 gm of ancef,1 gm of Tranexamic Acid. Decadron was not given because of patient's history of hyperglycemia after her last surgery.  The patient was positioned supine with a right thigh tourniquet placed.  The right lower extremity was prepped and draped in sterile fashion.  A time-  out was performed identifying the patient, planned procedure, and the appropriate extremity.     The leg was  exsanguinated, tourniquet elevated to 250 mmHg.  A midline incision was  made followed by median parapatellar arthrotomy. Anterior horn of the medial meniscus was released and resected. A medial release was performed, the infrapatellar fat pad was resected with care taken to protect the patellar tendon. The suprapatellar fat was removed to exposed the distal anterior femur. The anterior horn of the lateral meniscus and ACL were released.    Following initial  exposure, I first started with the femur  The femoral  canal was opened with a drill, canal was suctioned to try to prevent fat emboli.  An  intramedullary rod was passed set at 6 degrees valgus, 11 mm. The distal femur was resected.  Following this resection, the tibia was  subluxated anteriorly.  Using the extramedullary guide, 10 mm of bone was resected off  the lateral tibial spine.  Following the resection the extension gap was rectangular but the gap was way too small.  Felt that my tibial cut likely was deficient.  Elected to resect additional 4 mm off the proximal tibia..  We confirmed the gap would be  stable  medially and laterally with a size 10mm spacer block as well as confirmed that the tibial cut was perpendicular in the coronal plane, checking with an alignment rod.    Once this was done, the posterior femoral referencing femoral sizer was placed under to the posterior condyles with 3 degrees of external rotational which was parallel to the transepicondylar axis and perpendicular to [employer]. The femur was sized to be a size 9 in the anterior-  posterior dimension. The  anterior, posterior, and  chamfer cuts were made without difficulty nor   notching making certain that I was along the anterior cortex to help  with flexion gap stability. Next a laminar spreader was placed with the knee in flexion and the medial lateral menisci were resected.  5 cc of the Exparel mixture was injected in the medial side of the back of the knee and 3 cc in the lateral side.  1/2 inch curved osteotome was used to resect posterior osteophyte that was then removed with a pituitary rongeur.       At this point, the tibia was sized to be a size F.  The size F tray was  then pinned in position. Trial reduction was now carried with a 9 femur, F tibia, a 10 mm MC insert.  The knee had full extension and was stable to varus valgus stress in extension.  The knee was slightly tight in flexion and the PCL was partially released.   Attention was next directed to the patella.  Precut  measurement was noted to be 19 mm.  I resected down to 13 mm and used a  35mm patellar button to restore patellar height as well as cover the cut surface.     The patella lug holes were drilled and a 35mm patella poly trial was placed.    The knee was brought to full extension with good flexion stability with the patella tracking through the trochlea without application of pressure.     Next the femoral component was again assessed and determined to be seated and appropriately lateralized.  The femoral lug holes were drilled.  The femoral  component was then removed. Tibial component was again assessed and felt to be seated and appropriately rotated with the medial third of the tubercle. The tibia was then drilled, and keel punched.     Final components were  opened and antibiotic cement was mixed.      Final implants were then  cemented onto cleaned and dried cut surfaces of bone with the knee brought to extension with a 10 mm MC poly.  The knee was irrigated with sterile Betadine diluted in saline as well as pulse lavage normal saline.  The synovial lining was  then injected a dilute Exparel.      Once the cement had fully cured, excess cement was removed throughout the knee.  I confirmed that I was satisfied with the range of motion and stability, and the final 10mm MC poly insert was chosen.  It was placed into the knee.         The tourniquet had been let down.  No significant hemostasis was required.  The medial parapatellar arthrotomy was then reapproximated using #1 Stratafix sutures with the  knee  in flexion.  The remaining wound was closed with 0 stratafix, 2-0 Vicryl, and running 3-0 Monocryl. The knee was cleaned, dried, dressed sterilely using Dermabond and   Aquacel dressing.  The patient was then brought to recovery room in stable condition, tolerating the procedure  well. There were no complications.   Post op recs: WB: WBAT Abx: ancef Imaging: PACU xrays DVT prophylaxis: Eliquis 2.5 mg twice daily x 4 weeks given the patient's history of cancer. Follow up: 2 weeks after surgery for a wound check with Dr. Blanchie Dessert at [facility].  Address: [address], [place], [place] [address]  Office Phone: [phone]  Weber Cooks, MD Orthopaedic Surgery

## 2022-11-28 ENCOUNTER — Encounter (HOSPITAL_COMMUNITY): Payer: Self-pay | Admitting: Orthopedic Surgery

## 2022-11-30 DIAGNOSIS — R262 Difficulty in walking, not elsewhere classified: Secondary | ICD-10-CM | POA: Diagnosis not present

## 2022-11-30 DIAGNOSIS — M6281 Muscle weakness (generalized): Secondary | ICD-10-CM | POA: Diagnosis not present

## 2022-11-30 DIAGNOSIS — M1711 Unilateral primary osteoarthritis, right knee: Secondary | ICD-10-CM | POA: Diagnosis not present

## 2022-11-30 DIAGNOSIS — M25661 Stiffness of right knee, not elsewhere classified: Secondary | ICD-10-CM | POA: Diagnosis not present

## 2022-12-05 DIAGNOSIS — M6281 Muscle weakness (generalized): Secondary | ICD-10-CM | POA: Diagnosis not present

## 2022-12-05 DIAGNOSIS — R262 Difficulty in walking, not elsewhere classified: Secondary | ICD-10-CM | POA: Diagnosis not present

## 2022-12-05 DIAGNOSIS — M25661 Stiffness of right knee, not elsewhere classified: Secondary | ICD-10-CM | POA: Diagnosis not present

## 2022-12-05 DIAGNOSIS — M1711 Unilateral primary osteoarthritis, right knee: Secondary | ICD-10-CM | POA: Diagnosis not present

## 2022-12-07 DIAGNOSIS — M25661 Stiffness of right knee, not elsewhere classified: Secondary | ICD-10-CM | POA: Diagnosis not present

## 2022-12-07 DIAGNOSIS — R262 Difficulty in walking, not elsewhere classified: Secondary | ICD-10-CM | POA: Diagnosis not present

## 2022-12-07 DIAGNOSIS — M1711 Unilateral primary osteoarthritis, right knee: Secondary | ICD-10-CM | POA: Diagnosis not present

## 2022-12-07 DIAGNOSIS — M6281 Muscle weakness (generalized): Secondary | ICD-10-CM | POA: Diagnosis not present

## 2022-12-12 DIAGNOSIS — M25661 Stiffness of right knee, not elsewhere classified: Secondary | ICD-10-CM | POA: Diagnosis not present

## 2022-12-12 DIAGNOSIS — M6281 Muscle weakness (generalized): Secondary | ICD-10-CM | POA: Diagnosis not present

## 2022-12-12 DIAGNOSIS — R262 Difficulty in walking, not elsewhere classified: Secondary | ICD-10-CM | POA: Diagnosis not present

## 2022-12-12 DIAGNOSIS — M1711 Unilateral primary osteoarthritis, right knee: Secondary | ICD-10-CM | POA: Diagnosis not present

## 2022-12-14 DIAGNOSIS — R262 Difficulty in walking, not elsewhere classified: Secondary | ICD-10-CM | POA: Diagnosis not present

## 2022-12-14 DIAGNOSIS — M1711 Unilateral primary osteoarthritis, right knee: Secondary | ICD-10-CM | POA: Diagnosis not present

## 2022-12-14 DIAGNOSIS — M6281 Muscle weakness (generalized): Secondary | ICD-10-CM | POA: Diagnosis not present

## 2022-12-14 DIAGNOSIS — M25661 Stiffness of right knee, not elsewhere classified: Secondary | ICD-10-CM | POA: Diagnosis not present

## 2022-12-19 DIAGNOSIS — M25661 Stiffness of right knee, not elsewhere classified: Secondary | ICD-10-CM | POA: Diagnosis not present

## 2022-12-19 DIAGNOSIS — M1711 Unilateral primary osteoarthritis, right knee: Secondary | ICD-10-CM | POA: Diagnosis not present

## 2022-12-19 DIAGNOSIS — M6281 Muscle weakness (generalized): Secondary | ICD-10-CM | POA: Diagnosis not present

## 2022-12-19 DIAGNOSIS — R262 Difficulty in walking, not elsewhere classified: Secondary | ICD-10-CM | POA: Diagnosis not present

## 2022-12-21 DIAGNOSIS — R262 Difficulty in walking, not elsewhere classified: Secondary | ICD-10-CM | POA: Diagnosis not present

## 2022-12-21 DIAGNOSIS — M6281 Muscle weakness (generalized): Secondary | ICD-10-CM | POA: Diagnosis not present

## 2022-12-21 DIAGNOSIS — M1711 Unilateral primary osteoarthritis, right knee: Secondary | ICD-10-CM | POA: Diagnosis not present

## 2022-12-21 DIAGNOSIS — M25661 Stiffness of right knee, not elsewhere classified: Secondary | ICD-10-CM | POA: Diagnosis not present

## 2022-12-25 DIAGNOSIS — R262 Difficulty in walking, not elsewhere classified: Secondary | ICD-10-CM | POA: Diagnosis not present

## 2022-12-25 DIAGNOSIS — M25661 Stiffness of right knee, not elsewhere classified: Secondary | ICD-10-CM | POA: Diagnosis not present

## 2022-12-25 DIAGNOSIS — M6281 Muscle weakness (generalized): Secondary | ICD-10-CM | POA: Diagnosis not present

## 2022-12-25 DIAGNOSIS — M1711 Unilateral primary osteoarthritis, right knee: Secondary | ICD-10-CM | POA: Diagnosis not present

## 2022-12-27 DIAGNOSIS — M25661 Stiffness of right knee, not elsewhere classified: Secondary | ICD-10-CM | POA: Diagnosis not present

## 2022-12-27 DIAGNOSIS — R262 Difficulty in walking, not elsewhere classified: Secondary | ICD-10-CM | POA: Diagnosis not present

## 2022-12-27 DIAGNOSIS — M1711 Unilateral primary osteoarthritis, right knee: Secondary | ICD-10-CM | POA: Diagnosis not present

## 2022-12-27 DIAGNOSIS — M6281 Muscle weakness (generalized): Secondary | ICD-10-CM | POA: Diagnosis not present

## 2023-01-02 DIAGNOSIS — M25661 Stiffness of right knee, not elsewhere classified: Secondary | ICD-10-CM | POA: Diagnosis not present

## 2023-01-02 DIAGNOSIS — R262 Difficulty in walking, not elsewhere classified: Secondary | ICD-10-CM | POA: Diagnosis not present

## 2023-01-02 DIAGNOSIS — M1711 Unilateral primary osteoarthritis, right knee: Secondary | ICD-10-CM | POA: Diagnosis not present

## 2023-01-02 DIAGNOSIS — M6281 Muscle weakness (generalized): Secondary | ICD-10-CM | POA: Diagnosis not present

## 2023-01-04 DIAGNOSIS — M1711 Unilateral primary osteoarthritis, right knee: Secondary | ICD-10-CM | POA: Diagnosis not present

## 2023-01-04 DIAGNOSIS — M25661 Stiffness of right knee, not elsewhere classified: Secondary | ICD-10-CM | POA: Diagnosis not present

## 2023-01-04 DIAGNOSIS — R262 Difficulty in walking, not elsewhere classified: Secondary | ICD-10-CM | POA: Diagnosis not present

## 2023-01-04 DIAGNOSIS — M6281 Muscle weakness (generalized): Secondary | ICD-10-CM | POA: Diagnosis not present

## 2023-01-09 DIAGNOSIS — M1711 Unilateral primary osteoarthritis, right knee: Secondary | ICD-10-CM | POA: Diagnosis not present

## 2023-01-11 DIAGNOSIS — M25661 Stiffness of right knee, not elsewhere classified: Secondary | ICD-10-CM | POA: Diagnosis not present

## 2023-01-11 DIAGNOSIS — M1711 Unilateral primary osteoarthritis, right knee: Secondary | ICD-10-CM | POA: Diagnosis not present

## 2023-01-11 DIAGNOSIS — R262 Difficulty in walking, not elsewhere classified: Secondary | ICD-10-CM | POA: Diagnosis not present

## 2023-01-11 DIAGNOSIS — M6281 Muscle weakness (generalized): Secondary | ICD-10-CM | POA: Diagnosis not present

## 2023-04-12 DIAGNOSIS — Z0000 Encounter for general adult medical examination without abnormal findings: Secondary | ICD-10-CM | POA: Diagnosis not present

## 2023-04-12 DIAGNOSIS — E1136 Type 2 diabetes mellitus with diabetic cataract: Secondary | ICD-10-CM | POA: Diagnosis not present

## 2023-04-12 DIAGNOSIS — E7800 Pure hypercholesterolemia, unspecified: Secondary | ICD-10-CM | POA: Diagnosis not present

## 2023-04-12 DIAGNOSIS — Z85850 Personal history of malignant neoplasm of thyroid: Secondary | ICD-10-CM | POA: Diagnosis not present

## 2023-04-12 DIAGNOSIS — Z5181 Encounter for therapeutic drug level monitoring: Secondary | ICD-10-CM | POA: Diagnosis not present

## 2023-04-12 DIAGNOSIS — Z23 Encounter for immunization: Secondary | ICD-10-CM | POA: Diagnosis not present

## 2023-04-12 DIAGNOSIS — I10 Essential (primary) hypertension: Secondary | ICD-10-CM | POA: Diagnosis not present

## 2023-04-12 DIAGNOSIS — R600 Localized edema: Secondary | ICD-10-CM | POA: Diagnosis not present

## 2023-04-12 DIAGNOSIS — E890 Postprocedural hypothyroidism: Secondary | ICD-10-CM | POA: Diagnosis not present

## 2023-04-12 DIAGNOSIS — E039 Hypothyroidism, unspecified: Secondary | ICD-10-CM | POA: Diagnosis not present

## 2023-04-16 ENCOUNTER — Other Ambulatory Visit (HOSPITAL_COMMUNITY): Payer: Self-pay | Admitting: Internal Medicine

## 2023-04-16 DIAGNOSIS — R600 Localized edema: Secondary | ICD-10-CM

## 2023-04-18 ENCOUNTER — Ambulatory Visit (HOSPITAL_COMMUNITY): Payer: Medicare Other | Attending: Internal Medicine

## 2023-04-18 DIAGNOSIS — R600 Localized edema: Secondary | ICD-10-CM

## 2023-04-18 LAB — ECHOCARDIOGRAM COMPLETE
AR max vel: 2.32 cm2
AV Area VTI: 2.26 cm2
AV Area mean vel: 2.23 cm2
AV Mean grad: 5.5 mm[Hg]
AV Peak grad: 10.1 mm[Hg]
Ao pk vel: 1.59 m/s
Area-P 1/2: 4.64 cm2
S' Lateral: 3.4 cm

## 2023-05-07 DIAGNOSIS — Z85850 Personal history of malignant neoplasm of thyroid: Secondary | ICD-10-CM | POA: Diagnosis not present

## 2023-05-07 DIAGNOSIS — E890 Postprocedural hypothyroidism: Secondary | ICD-10-CM | POA: Diagnosis not present

## 2023-05-10 DIAGNOSIS — Z85850 Personal history of malignant neoplasm of thyroid: Secondary | ICD-10-CM | POA: Diagnosis not present

## 2023-05-10 DIAGNOSIS — H40013 Open angle with borderline findings, low risk, bilateral: Secondary | ICD-10-CM | POA: Diagnosis not present

## 2023-05-10 DIAGNOSIS — H10413 Chronic giant papillary conjunctivitis, bilateral: Secondary | ICD-10-CM | POA: Diagnosis not present

## 2023-05-10 DIAGNOSIS — E119 Type 2 diabetes mellitus without complications: Secondary | ICD-10-CM | POA: Diagnosis not present

## 2023-05-10 DIAGNOSIS — H25813 Combined forms of age-related cataract, bilateral: Secondary | ICD-10-CM | POA: Diagnosis not present

## 2023-05-10 DIAGNOSIS — E890 Postprocedural hypothyroidism: Secondary | ICD-10-CM | POA: Diagnosis not present

## 2023-10-06 ENCOUNTER — Other Ambulatory Visit: Payer: Self-pay

## 2023-10-06 ENCOUNTER — Encounter (HOSPITAL_COMMUNITY): Payer: Self-pay

## 2023-10-06 ENCOUNTER — Emergency Department (HOSPITAL_COMMUNITY)
Admission: EM | Admit: 2023-10-06 | Discharge: 2023-10-06 | Disposition: A | Discharge status: Home / Self Care | Attending: Emergency Medicine | Admitting: Emergency Medicine

## 2023-10-06 DIAGNOSIS — R112 Nausea with vomiting, unspecified: Secondary | ICD-10-CM | POA: Diagnosis not present

## 2023-10-06 DIAGNOSIS — E1165 Type 2 diabetes mellitus with hyperglycemia: Secondary | ICD-10-CM | POA: Diagnosis not present

## 2023-10-06 DIAGNOSIS — R197 Diarrhea, unspecified: Secondary | ICD-10-CM | POA: Diagnosis not present

## 2023-10-06 DIAGNOSIS — Z7984 Long term (current) use of oral hypoglycemic drugs: Secondary | ICD-10-CM | POA: Diagnosis not present

## 2023-10-06 DIAGNOSIS — Z79899 Other long term (current) drug therapy: Secondary | ICD-10-CM | POA: Diagnosis not present

## 2023-10-06 DIAGNOSIS — E039 Hypothyroidism, unspecified: Secondary | ICD-10-CM | POA: Diagnosis not present

## 2023-10-06 DIAGNOSIS — E860 Dehydration: Secondary | ICD-10-CM | POA: Diagnosis not present

## 2023-10-06 DIAGNOSIS — R739 Hyperglycemia, unspecified: Secondary | ICD-10-CM

## 2023-10-06 DIAGNOSIS — I10 Essential (primary) hypertension: Secondary | ICD-10-CM | POA: Diagnosis not present

## 2023-10-06 DIAGNOSIS — Z7901 Long term (current) use of anticoagulants: Secondary | ICD-10-CM | POA: Diagnosis not present

## 2023-10-06 LAB — URINALYSIS, ROUTINE W REFLEX MICROSCOPIC
Bilirubin Urine: NEGATIVE
Glucose, UA: 150 mg/dL — AB
Ketones, ur: 5 mg/dL — AB
Leukocytes,Ua: NEGATIVE
Nitrite: NEGATIVE
Protein, ur: 100 mg/dL — AB
Specific Gravity, Urine: 1.01 (ref 1.005–1.030)
pH: 7 (ref 5.0–8.0)

## 2023-10-06 LAB — CBC WITH DIFFERENTIAL/PLATELET
Abs Immature Granulocytes: 0.05 10*3/uL (ref 0.00–0.07)
Basophils Absolute: 0 10*3/uL (ref 0.0–0.1)
Basophils Relative: 0 %
Eosinophils Absolute: 0 10*3/uL (ref 0.0–0.5)
Eosinophils Relative: 0 %
HCT: 45.2 % (ref 36.0–46.0)
Hemoglobin: 14.9 g/dL (ref 12.0–15.0)
Immature Granulocytes: 0 %
Lymphocytes Relative: 9 %
Lymphs Abs: 1 10*3/uL (ref 0.7–4.0)
MCH: 27.3 pg (ref 26.0–34.0)
MCHC: 33 g/dL (ref 30.0–36.0)
MCV: 82.9 fL (ref 80.0–100.0)
Monocytes Absolute: 0.8 10*3/uL (ref 0.1–1.0)
Monocytes Relative: 7 %
Neutro Abs: 9.6 10*3/uL — ABNORMAL HIGH (ref 1.7–7.7)
Neutrophils Relative %: 84 %
Platelets: 307 10*3/uL (ref 150–400)
RBC: 5.45 MIL/uL — ABNORMAL HIGH (ref 3.87–5.11)
RDW: 15.5 % (ref 11.5–15.5)
WBC: 11.4 10*3/uL — ABNORMAL HIGH (ref 4.0–10.5)
nRBC: 0 % (ref 0.0–0.2)

## 2023-10-06 LAB — COMPREHENSIVE METABOLIC PANEL WITH GFR
ALT: 15 U/L (ref 0–44)
AST: 23 U/L (ref 15–41)
Albumin: 4.5 g/dL (ref 3.5–5.0)
Alkaline Phosphatase: 91 U/L (ref 38–126)
Anion gap: 22 — ABNORMAL HIGH (ref 5–15)
BUN: 13 mg/dL (ref 8–23)
CO2: 20 mmol/L — ABNORMAL LOW (ref 22–32)
Calcium: 9.9 mg/dL (ref 8.9–10.3)
Chloride: 92 mmol/L — ABNORMAL LOW (ref 98–111)
Creatinine, Ser: 1.36 mg/dL — ABNORMAL HIGH (ref 0.44–1.00)
GFR, Estimated: 43 mL/min — ABNORMAL LOW (ref >60)
Glucose, Bld: 249 mg/dL — ABNORMAL HIGH (ref 70–99)
Potassium: 3 mmol/L — ABNORMAL LOW (ref 3.5–5.1)
Sodium: 134 mmol/L — ABNORMAL LOW (ref 135–145)
Total Bilirubin: 0.6 mg/dL (ref 0.0–1.2)
Total Protein: 8.7 g/dL — ABNORMAL HIGH (ref 6.5–8.1)

## 2023-10-06 LAB — I-STAT VENOUS BLOOD GAS, ED
Acid-Base Excess: 5 mmol/L — ABNORMAL HIGH (ref 0.0–2.0)
Bicarbonate: 26.9 mmol/L (ref 20.0–28.0)
Calcium, Ion: 0.93 mmol/L — ABNORMAL LOW (ref 1.15–1.40)
HCT: 44 % (ref 36.0–46.0)
Hemoglobin: 15 g/dL (ref 12.0–15.0)
O2 Saturation: 98 %
Potassium: 4.4 mmol/L (ref 3.5–5.1)
Sodium: 135 mmol/L (ref 135–145)
TCO2: 28 mmol/L (ref 22–32)
pCO2, Ven: 30.3 mmHg — ABNORMAL LOW (ref 44–60)
pH, Ven: 7.557 — ABNORMAL HIGH (ref 7.25–7.43)
pO2, Ven: 88 mmHg — ABNORMAL HIGH (ref 32–45)

## 2023-10-06 LAB — CBG MONITORING, ED
Glucose-Capillary: 280 mg/dL — ABNORMAL HIGH (ref 70–99)
Glucose-Capillary: 219 mg/dL — ABNORMAL HIGH (ref 70–99)
Glucose-Capillary: 192 mg/dL — ABNORMAL HIGH (ref 70–99)

## 2023-10-06 LAB — LIPASE, BLOOD: Lipase: 26 U/L (ref 11–51)

## 2023-10-06 LAB — I-STAT CG4 LACTIC ACID, ED: Lactic Acid, Venous: 1.7 mmol/L (ref 0.5–1.9)

## 2023-10-06 MED ORDER — INSULIN ASPART 100 UNIT/ML IJ SOLN
5.0000 [IU] | Freq: Once | INTRAMUSCULAR | Status: AC
  Administered 2023-10-06: 5 [IU] via SUBCUTANEOUS

## 2023-10-06 MED ORDER — ONDANSETRON HCL 4 MG PO TABS: 4.0000 mg | ORAL_TABLET | Freq: Three times a day (TID) | ORAL | 0 refills | Status: DC | PRN

## 2023-10-06 MED ORDER — ATENOLOL 25 MG PO TABS
100.0000 mg | ORAL_TABLET | Freq: Once | ORAL | Status: AC
  Administered 2023-10-06: 100 mg via ORAL
  Filled 2023-10-06: qty 4

## 2023-10-06 MED ORDER — SODIUM CHLORIDE 0.9 % IV BOLUS
1000.0000 mL | Freq: Once | INTRAVENOUS | Status: AC
  Administered 2023-10-06: 1000 mL via INTRAVENOUS

## 2023-10-06 MED ORDER — ONDANSETRON HCL 4 MG/2ML IJ SOLN
4.0000 mg | Freq: Once | INTRAMUSCULAR | Status: AC
  Administered 2023-10-06: 4 mg via INTRAVENOUS
  Filled 2023-10-06: qty 2

## 2023-10-06 MED ORDER — POTASSIUM CHLORIDE CRYS ER 20 MEQ PO TBCR
40.0000 meq | EXTENDED_RELEASE_TABLET | Freq: Once | ORAL | Status: AC
  Administered 2023-10-06: 40 meq via ORAL
  Filled 2023-10-06: qty 2

## 2023-10-06 NOTE — Discharge Instructions (Addendum)
 Please follow-up with your doctor for further care.  Your blood sugar is elevated today, take Zofran as needed for your nausea and take your medication as prescribed.  Stay hydrated. Return to ER if you have any concern.

## 2023-10-06 NOTE — ED Triage Notes (Signed)
 Pt c.o high blood sugar for the past 2 days (187), states she normally runs around 130s and when it gets that high she is unable to keep anything down. Pt denies abd pain. C.o nausea

## 2023-10-06 NOTE — ED Provider Notes (Signed)
 [place] EMERGENCY DEPARTMENT AT [facility] Provider Note   CSN: [id number] Arrival date & time: 10/06/23  0820     History  No chief complaint on file.   Sara [name] is a 68 y.o. female.  The history is provided by the patient and medical records. No language interpreter was used.     68 year old female history of diabetes, hypertension, hypercholesterolemia, hypothyroidism presented to ER with concerns of elevated blood sugar.  Patient states yesterday she felt very sweaty, nauseous vomiting and having some loose stools.  She checked her blood sugar and noticed that it was high at 187.  She states this is high for her and usually when her blood sugar is out of whack she would feel this way.  Symptoms persist throughout the day today prompting this ER visit.  She is requesting for IV fluid.  States it helps in the past.  She does not endorse any fever denies any headache no runny nose sneezing or coughing no chest pain or shortness of breath no abdominal pain no urinary symptoms.  No change in her medication and no recent sickness.  She is on Januvia.  Home Medications Prior to Admission medications   Medication Sig Start Date End Date Taking? Authorizing Provider  amLODipine (NORVASC) 10 MG tablet Take 1 tablet (10 mg total) by mouth daily. Hold, discuss BP management with PCP 01/20/18   Mikhail, Maryann, DO  apixaban (ELIQUIS) 2.5 MG TABS tablet Take 1 tablet (2.5 mg total) by mouth 2 (two) times daily. 11/27/22   Albertus Alt, PA-C  Ascorbic Acid (VITAMIN C) 100 MG tablet Take 100 mg by mouth 2 (two) times daily.    [provider]  atenolol (TENORMIN) 100 MG tablet Take 1 tablet (100 mg total) by mouth daily. Hold, discuss BP management with PCP 01/20/18   Mikhail, Maryann, DO  Calcium-Magnesium-Vitamin D (CITRACAL SLOW RELEASE PO) Take 1 tablet by mouth daily.    [provider]  Cholecalciferol (VITAMIN D3) 25 MCG (1000 UT) CAPS Take 1,000  Units by mouth daily.    [provider]  ferrous sulfate 324 MG TBEC Take 324 mg by mouth daily with breakfast.    [provider]  Glucos-Chond-Sterol-Fish Oil (GLUCOSAMINE CHONDROITIN PLUS) CAPS Take 2 tablets by mouth daily.    [provider]  KLOR-CON M10 10 MEQ tablet Take 10 mEq by mouth 2 (two) times daily. 06/24/20   [provider]  latanoprost (XALATAN) 0.005 % ophthalmic solution Place 1 drop into both eyes at bedtime. 01/16/18   [provider]  levothyroxine (SYNTHROID) 150 MCG tablet Take 150 mcg by mouth daily before breakfast. 11/04/19   [provider]  pantoprazole (PROTONIX) 40 MG tablet Take 40 mg by mouth daily. 10/27/19   [provider]  polyethylene glycol (MIRALAX) 17 g packet Take 17 g by mouth daily. 11/27/22   Albertus Alt, PA-C  simvastatin (ZOCOR) 20 MG tablet Take 20 mg by mouth at bedtime.     [provider]  sitaGLIPtin (JANUVIA) 100 MG tablet Take 100 mg by mouth daily.    [provider]  valsartan (DIOVAN) 40 MG tablet Take 40 mg by mouth daily. 05/08/22   [provider]      Allergies    Dyazide [hydrochlorothiazide-triamterene] and Metformin and related    Review of Systems   Review of Systems  All other systems reviewed and are negative.   Physical Exam Updated Vital Signs BP Aaron Aas)  158/83   Pulse (!) 107   Temp 97.6 F (36.4 C) (Oral)   Resp 15   Ht 5\' 9"  (1.753 m)   Wt 122.5 kg   SpO2 100%   BMI 39.87 kg/m  Physical Exam Vitals and nursing note reviewed.  Constitutional:      General: She is not in acute distress.    Appearance: She is well-developed.  HENT:     Head: Atraumatic.  Eyes:     Conjunctiva/sclera: Conjunctivae normal.  Cardiovascular:     Rate and Rhythm: Regular rhythm. Tachycardia present.     Pulses: Normal pulses.     Heart sounds: Normal heart sounds.  Pulmonary:     Effort: Pulmonary effort is normal.     Breath sounds:  No wheezing or rhonchi.  Abdominal:     General: Abdomen is flat.     Palpations: Abdomen is soft.     Tenderness: There is no abdominal tenderness.  Musculoskeletal:        General: Normal range of motion.     Cervical back: Normal range of motion and neck supple.  Skin:    Findings: No rash.  Neurological:     Mental Status: She is alert and oriented to person, place, and time.  Psychiatric:        Mood and Affect: Mood normal.    ED Results / Procedures / Treatments   Labs (all labs ordered are listed, but only abnormal results are displayed) Labs Reviewed  CBC WITH DIFFERENTIAL/PLATELET - Abnormal; Notable for the following components:      Result Value   WBC 11.4 (*)    RBC 5.45 (*)    Neutro Abs 9.6 (*)    All other components within normal limits  COMPREHENSIVE METABOLIC PANEL WITH GFR - Abnormal; Notable for the following components:   Sodium 134 (*)    Potassium 3.0 (*)    Chloride 92 (*)    CO2 20 (*)    Glucose, Bld 249 (*)    Creatinine, Ser 1.36 (*)    Total Protein 8.7 (*)    GFR, Estimated 43 (*)    Anion gap 22 (*)    All other components within normal limits  URINALYSIS, ROUTINE W REFLEX MICROSCOPIC - Abnormal; Notable for the following components:   APPearance HAZY (*)    Glucose, UA 150 (*)    Hgb urine dipstick SMALL (*)    Ketones, ur 5 (*)    Protein, ur 100 (*)    Bacteria, UA RARE (*)    All other components within normal limits  CBG MONITORING, ED - Abnormal; Notable for the following components:   Glucose-Capillary 280 (*)    All other components within normal limits  CBG MONITORING, ED - Abnormal; Notable for the following components:   Glucose-Capillary 219 (*)    All other components within normal limits  CBG MONITORING, ED - Abnormal; Notable for the following components:   Glucose-Capillary 192 (*)    All other components within normal limits  I-STAT VENOUS BLOOD GAS, ED - Abnormal; Notable for the following components:   pH, Ven  7.557 (*)    pCO2, Ven 30.3 (*)    pO2, Ven 88 (*)    Acid-Base Excess 5.0 (*)    Calcium, Ion 0.93 (*)    All other components within normal limits  LIPASE, BLOOD  BLOOD GAS, VENOUS  I-STAT CG4 LACTIC ACID, ED  I-STAT CG4 LACTIC ACID, ED    EKG EKG  Interpretation Date/Time:  Saturday October 06 2023 08:36:12 EDT Ventricular Rate:  106 PR Interval:  156 QRS Duration:  101 QT Interval:  351 QTC Calculation: 467 R Axis:   84  Text Interpretation: Sinus tachycardia RSR' in V1 or V2, probably normal variant Left ventricular hypertrophy Inferior infarct, age indeterminate Confirmed by Almond Army ([id number]) on 10/06/2023 8:44:07 AM  Radiology No results found.  Procedures Procedures    Medications Ordered in ED Medications  sodium chloride 0.9 % bolus 1,000 mL (0 mLs Intravenous Stopped 10/06/23 1033)  ondansetron (ZOFRAN) injection 4 mg (4 mg Intravenous Given 10/06/23 0915)  insulin aspart (novoLOG) injection 5 Units (5 Units Subcutaneous Given 10/06/23 1047)  potassium chloride SA (KLOR-CON M) CR tablet 40 mEq (40 mEq Oral Given 10/06/23 1047)    ED Course/ Medical Decision Making/ A&P                                 Medical Decision Making Amount and/or Complexity of Data Reviewed Labs: ordered. ECG/medicine tests: ordered.  Risk Prescription drug management.   BP (!) 148/92 (BP Location: Right Arm)   Pulse (!) 115   Temp 97.6 F (36.4 C) (Oral)   Resp 14   Ht 5\' 9"  (1.753 m)   Wt 122.5 kg   SpO2 100%   BMI 39.87 kg/m   30:58 AM  68 year old female history of diabetes, hypertension, hypercholesterolemia, hypothyroidism presented to ER with concerns of elevated blood sugar.  Patient states yesterday she felt very sweaty, nauseous vomiting and having some loose stools.  She checked her blood sugar and noticed that it was high at 187.  She states this is high for her and usually when her blood sugar is out of whack she would feel this way.  Symptoms persist  throughout the day today prompting this ER visit.  She is requesting for IV fluid.  States it helps in the past.  She does not endorse any fever denies any headache no runny nose sneezing or coughing no chest pain or shortness of breath no abdominal pain no urinary symptoms.  No change in her medication and no recent sickness.  She is on Januvia.  On exam, patient is laying in bed appears to be in no acute discomfort.  She is tachycardic and skin clammy to the touch.  Her lungs are clear to auscultation bilaterally abdomen is soft nontender bowel sounds present.  Strength is equal throughout.  -Labs ordered, independently viewed and interpreted by me.  Labs remarkable for CBG of 280 however patient has anion gap of 22, and 5 ketones in her urine suggestive for picture of lactic acidosis such as DKA.  Will check a VBG.  Patient received IV fluid.  Potassium of 3.0, supplementation given urine without signs of urine tract infection -The patient was maintained on a cardiac monitor.  I personally viewed and interpreted the cardiac monitored which showed an underlying rhythm of: Sinus tachycardia -Imaging including abdominal pelvis CT scan considered but patient has a fairly benign abdominal exam -This patient presents to the ED for concern of fatigue, this involves an extensive number of treatment options, and is a complaint that carries with it a high risk of complications and morbidity.  The differential diagnosis includes electrolytes derangement, DKA, hyperglycemia, hypoglycemia, infection, anemia, stroke, mi -Co morbidities that complicate the patient evaluation includes DM, HTN, HLD -Treatment includes insulin, potassium, IVF -Reevaluation of the patient after these medicines  showed that the patient improved -PCP office notes or outside notes reviewed -Discussion with attending Dr. Leida Puna -Escalation to admission/observation considered: patients feels much better, is comfortable with discharge, and  will follow up with PCP -Prescription medication considered, patient comfortable with zofran -Social Determinant of Health considered which includes tobacco use.   Normal lactic acid, VBG without acidosis.  Normal bicarb.  CBG improved to 192 after IV fluid and insulin.  Blood pressure improved with atenolol which is a home blood pressure medication.  Heart rate improved as well with fluid.  Patient tolerates p.o.  At this time she is stable for discharge.        Final Clinical Impression(s) / ED Diagnoses Final diagnoses:  Hyperglycemia  Dehydration  Nausea vomiting and diarrhea    Rx / DC Orders ED Discharge Orders          Ordered    ondansetron (ZOFRAN) 4 MG tablet  Every 8 hours PRN        10/06/23 1341              Debbra Fairy, PA-C 10/06/23 1344    Almond Army, MD 10/07/23 [phone]

## 2023-10-17 DIAGNOSIS — E890 Postprocedural hypothyroidism: Secondary | ICD-10-CM | POA: Diagnosis not present

## 2023-10-17 DIAGNOSIS — E1136 Type 2 diabetes mellitus with diabetic cataract: Secondary | ICD-10-CM | POA: Diagnosis not present

## 2023-10-17 DIAGNOSIS — I10 Essential (primary) hypertension: Secondary | ICD-10-CM | POA: Diagnosis not present

## 2023-10-17 DIAGNOSIS — E039 Hypothyroidism, unspecified: Secondary | ICD-10-CM | POA: Diagnosis not present

## 2023-10-17 DIAGNOSIS — Z85850 Personal history of malignant neoplasm of thyroid: Secondary | ICD-10-CM | POA: Diagnosis not present

## 2023-10-17 DIAGNOSIS — E7800 Pure hypercholesterolemia, unspecified: Secondary | ICD-10-CM | POA: Diagnosis not present

## 2023-12-10 DIAGNOSIS — Z6841 Body Mass Index (BMI) 40.0 and over, adult: Secondary | ICD-10-CM | POA: Diagnosis not present

## 2023-12-10 DIAGNOSIS — N958 Other specified menopausal and perimenopausal disorders: Secondary | ICD-10-CM | POA: Diagnosis not present

## 2023-12-10 DIAGNOSIS — Z1231 Encounter for screening mammogram for malignant neoplasm of breast: Secondary | ICD-10-CM | POA: Diagnosis not present

## 2023-12-10 DIAGNOSIS — M8588 Other specified disorders of bone density and structure, other site: Secondary | ICD-10-CM | POA: Diagnosis not present

## 2023-12-10 DIAGNOSIS — Z01419 Encounter for gynecological examination (general) (routine) without abnormal findings: Secondary | ICD-10-CM | POA: Diagnosis not present

## 2024-01-04 ENCOUNTER — Other Ambulatory Visit: Payer: Self-pay

## 2024-01-04 ENCOUNTER — Emergency Department (HOSPITAL_COMMUNITY): Admission: EM | Admit: 2024-01-04 | Discharge: 2024-01-04 | Disposition: A | Discharge status: Home / Self Care

## 2024-01-04 ENCOUNTER — Encounter (HOSPITAL_COMMUNITY): Payer: Self-pay

## 2024-01-04 DIAGNOSIS — D72829 Elevated white blood cell count, unspecified: Secondary | ICD-10-CM | POA: Insufficient documentation

## 2024-01-04 DIAGNOSIS — E1165 Type 2 diabetes mellitus with hyperglycemia: Secondary | ICD-10-CM | POA: Insufficient documentation

## 2024-01-04 DIAGNOSIS — Z79899 Other long term (current) drug therapy: Secondary | ICD-10-CM | POA: Diagnosis not present

## 2024-01-04 DIAGNOSIS — R112 Nausea with vomiting, unspecified: Secondary | ICD-10-CM | POA: Diagnosis not present

## 2024-01-04 DIAGNOSIS — E876 Hypokalemia: Secondary | ICD-10-CM | POA: Diagnosis not present

## 2024-01-04 DIAGNOSIS — I10 Essential (primary) hypertension: Secondary | ICD-10-CM | POA: Insufficient documentation

## 2024-01-04 DIAGNOSIS — R739 Hyperglycemia, unspecified: Secondary | ICD-10-CM

## 2024-01-04 LAB — URINALYSIS, ROUTINE W REFLEX MICROSCOPIC
Bilirubin Urine: NEGATIVE
Glucose, UA: >=500 mg/dL — AB
Hgb urine dipstick: NEGATIVE
Ketones, ur: NEGATIVE mg/dL
Leukocytes,Ua: NEGATIVE
Nitrite: NEGATIVE
Protein, ur: 30 mg/dL — AB
Specific Gravity, Urine: 1.007 (ref 1.005–1.030)
pH: 7 (ref 5.0–8.0)

## 2024-01-04 LAB — COMPREHENSIVE METABOLIC PANEL WITH GFR
ALT: 13 U/L (ref 0–44)
AST: 15 U/L (ref 15–41)
Albumin: 4.1 g/dL (ref 3.5–5.0)
Alkaline Phosphatase: 80 U/L (ref 38–126)
Anion gap: 16 — ABNORMAL HIGH (ref 5–15)
BUN: 16 mg/dL (ref 8–23)
CO2: 28 mmol/L (ref 22–32)
Calcium: 9.7 mg/dL (ref 8.9–10.3)
Chloride: 93 mmol/L — ABNORMAL LOW (ref 98–111)
Creatinine, Ser: 1.09 mg/dL — ABNORMAL HIGH (ref 0.44–1.00)
GFR, Estimated: 56 mL/min — ABNORMAL LOW (ref >60)
Glucose, Bld: 202 mg/dL — ABNORMAL HIGH (ref 70–99)
Potassium: 2.9 mmol/L — ABNORMAL LOW (ref 3.5–5.1)
Sodium: 137 mmol/L (ref 135–145)
Total Bilirubin: 0.5 mg/dL (ref 0.0–1.2)
Total Protein: 8 g/dL (ref 6.5–8.1)

## 2024-01-04 LAB — CBC WITH DIFFERENTIAL/PLATELET
Abs Immature Granulocytes: 0.07 K/uL (ref 0.00–0.07)
Basophils Absolute: 0 K/uL (ref 0.0–0.1)
Basophils Relative: 0 %
Eosinophils Absolute: 0 K/uL (ref 0.0–0.5)
Eosinophils Relative: 0 %
HCT: 45.2 % (ref 36.0–46.0)
Hemoglobin: 15 g/dL (ref 12.0–15.0)
Immature Granulocytes: 1 %
Lymphocytes Relative: 9 %
Lymphs Abs: 1.4 K/uL (ref 0.7–4.0)
MCH: 27.8 pg (ref 26.0–34.0)
MCHC: 33.2 g/dL (ref 30.0–36.0)
MCV: 83.7 fL (ref 80.0–100.0)
Monocytes Absolute: 1.2 K/uL — ABNORMAL HIGH (ref 0.1–1.0)
Monocytes Relative: 8 %
Neutro Abs: 12.4 K/uL — ABNORMAL HIGH (ref 1.7–7.7)
Neutrophils Relative %: 82 %
Platelets: 383 K/uL (ref 150–400)
RBC: 5.4 MIL/uL — ABNORMAL HIGH (ref 3.87–5.11)
RDW: 16.1 % — ABNORMAL HIGH (ref 11.5–15.5)
WBC: 15 K/uL — ABNORMAL HIGH (ref 4.0–10.5)
nRBC: 0 % (ref 0.0–0.2)

## 2024-01-04 LAB — CBG MONITORING, ED
Glucose-Capillary: 237 mg/dL — ABNORMAL HIGH (ref 70–99)
Glucose-Capillary: 204 mg/dL — ABNORMAL HIGH (ref 70–99)

## 2024-01-04 LAB — LIPASE, BLOOD: Lipase: 24 U/L (ref 11–51)

## 2024-01-04 MED ORDER — ONDANSETRON 4 MG PO TBDP: 4.0000 mg | ORAL_TABLET | Freq: Three times a day (TID) | ORAL | 0 refills | Status: AC | PRN

## 2024-01-04 MED ORDER — IRBESARTAN 75 MG PO TABS
75.0000 mg | ORAL_TABLET | Freq: Every day | ORAL | Status: DC
  Administered 2024-01-04: 75 mg via ORAL
  Filled 2024-01-04: qty 1

## 2024-01-04 MED ORDER — POTASSIUM CHLORIDE CRYS ER 20 MEQ PO TBCR
20.0000 meq | EXTENDED_RELEASE_TABLET | Freq: Two times a day (BID) | ORAL | 0 refills | Status: AC
Start: 2024-01-04 — End: ?

## 2024-01-04 MED ORDER — ONDANSETRON HCL 4 MG/2ML IJ SOLN
4.0000 mg | Freq: Once | INTRAMUSCULAR | Status: AC
  Administered 2024-01-04: 4 mg via INTRAVENOUS
  Filled 2024-01-04: qty 2

## 2024-01-04 MED ORDER — POTASSIUM CHLORIDE CRYS ER 20 MEQ PO TBCR
40.0000 meq | EXTENDED_RELEASE_TABLET | Freq: Once | ORAL | Status: AC
  Administered 2024-01-04: 40 meq via ORAL
  Filled 2024-01-04: qty 2

## 2024-01-04 MED ORDER — LACTATED RINGERS IV BOLUS
1000.0000 mL | Freq: Once | INTRAVENOUS | Status: AC
  Administered 2024-01-04: 1000 mL via INTRAVENOUS

## 2024-01-04 MED ORDER — ATENOLOL 25 MG PO TABS
100.0000 mg | ORAL_TABLET | Freq: Every day | ORAL | Status: DC
  Administered 2024-01-04: 100 mg via ORAL
  Filled 2024-01-04: qty 4

## 2024-01-04 NOTE — ED Triage Notes (Signed)
 Pt her for hyperglycemia that started yesterday. C/O n/v. Denies abd pain. Denies CP and SHOB.

## 2024-01-04 NOTE — ED Notes (Signed)
 Redrew and sent light green tube for CMP per MD.

## 2024-01-04 NOTE — ED Provider Notes (Signed)
 [place] EMERGENCY DEPARTMENT AT [facility] Provider Note   CSN: [id number] Arrival date & time: 01/04/24  1107     Patient presents with: Emesis and Hyperglycemia   Sara [name] is a 68 y.o. female.   68 year old female with past medical history of diabetes and hypertension presenting to the emergency department today with nausea, vomiting, and feeling generally weak.  The patient states that her blood sugars have been over 200 at home which is elevated for her.  She states she has had similar presentations in the past when her blood sugar goes above 200.  She denies any abdominal pain with this.  Denies any diarrhea.  She denies any associated chest pain or shortness of breath.  She came to the ER today for further evaluation regarding this due to ongoing symptoms.   Emesis Hyperglycemia Associated symptoms: nausea and vomiting        Prior to Admission medications   Medication Sig Start Date End Date Taking? Authorizing Provider  amLODipine  (NORVASC ) 10 MG tablet Take 1 tablet (10 mg total) by mouth daily. Hold, discuss BP management with PCP 01/20/18  Yes Mikhail, Maryann, DO  Ascorbic Acid (VITAMIN C) 1000 MG tablet Take 1,000 mg by mouth 2 (two) times daily.   Yes [provider]  atenolol  (TENORMIN ) 100 MG tablet Take 1 tablet (100 mg total) by mouth daily. Hold, discuss BP management with PCP 01/20/18  Yes Mikhail, Maryann, DO  Calcium-Magnesium -Vitamin D  (CITRACAL SLOW RELEASE PO) Take 1 tablet by mouth daily.   Yes [provider]  Cholecalciferol  (VITAMIN D3) 25 MCG (1000 UT) CAPS Take 1,000 Units by mouth daily.   Yes [provider]  ferrous sulfate 324 MG TBEC Take 324 mg by mouth at bedtime.   Yes [provider]  Glucos-Chond-Sterol-Fish Oil (GLUCOSAMINE CHONDROITIN PLUS) CAPS Take 2 tablets by mouth daily.   Yes [provider]  KLOR-CON  M10 10 MEQ tablet Take 10 mEq by mouth 2 (two) times daily. 06/24/20   Yes [provider]  latanoprost (XALATAN) 0.005 % ophthalmic solution Place 1 drop into both eyes at bedtime. 01/16/18  Yes [provider]  levothyroxine  (SYNTHROID ) 150 MCG tablet Take 150 mcg by mouth daily before breakfast. 11/04/19  Yes [provider]  pantoprazole  (PROTONIX ) 40 MG tablet Take 40 mg by mouth daily. 10/27/19  Yes [provider]  simvastatin  (ZOCOR ) 20 MG tablet Take 20 mg by mouth at bedtime.    Yes [provider]  sitaGLIPtin (JANUVIA) 100 MG tablet Take 100 mg by mouth daily.   Yes [provider]  valsartan (DIOVAN) 40 MG tablet Take 40 mg by mouth daily. 05/08/22  Yes [provider]    Allergies: Dyazide [hydrochlorothiazide-triamterene] and Metformin and related    Review of Systems  Gastrointestinal:  Positive for nausea and vomiting.  All other systems reviewed and are negative.   Updated Vital Signs BP (!) 169/102 (BP Location: Right Arm)   Pulse 98   Temp 98.2 F (36.8 C) (Oral)   Resp 11   Ht 5' 9 (1.753 m)   Wt 122.5 kg   SpO2 100%   BMI 39.87 kg/m   Physical Exam Vitals and nursing note reviewed.   Gen: NAD Eyes: PERRL, EOMI HEENT: no oropharyngeal swelling Neck: trachea midline Resp: clear to auscultation bilaterally Card: RRR, no murmurs, rubs, or gallops Abd: nontender, nondistended Extremities: no calf tenderness, no edema Vascular: 2+ radial pulses bilaterally, 2+ DP pulses bilaterally Neuro:  No focal deficits Skin: no rashes Psyc: acting appropriately   (all labs ordered are listed, but only abnormal results are displayed) Labs Reviewed  CBC WITH DIFFERENTIAL/PLATELET - Abnormal; Notable for the following components:      Result Value   WBC 15.0 (*)    RBC 5.40 (*)    RDW 16.1 (*)    Neutro Abs 12.4 (*)    Monocytes Absolute 1.2 (*)    All other components within normal limits  URINALYSIS, ROUTINE W REFLEX MICROSCOPIC - Abnormal; Notable for the following  components:   Color, Urine STRAW (*)    Glucose, UA >=500 (*)    Protein, ur 30 (*)    Bacteria, UA RARE (*)    All other components within normal limits  CBG MONITORING, ED - Abnormal; Notable for the following components:   Glucose-Capillary 237 (*)    All other components within normal limits  CBG MONITORING, ED - Abnormal; Notable for the following components:   Glucose-Capillary 204 (*)    All other components within normal limits  COMPREHENSIVE METABOLIC PANEL WITH GFR  LIPASE, BLOOD    EKG: EKG Interpretation Date/Time:  Friday January 04 2024 11:54:59 EDT Ventricular Rate:  80 PR Interval:  160 QRS Duration:  102 QT Interval:  409 QTC Calculation: 472 R Axis:   68  Text Interpretation: Sinus rhythm Probable left atrial enlargement Confirmed by Ruthe Cornet [phone]) on 01/04/2024 3:57:08 PM  Radiology: No results found.   Procedures   Medications Ordered in the ED  lactated ringers  bolus 1,000 mL (0 mLs Intravenous Stopped 01/04/24 1408)  ondansetron  (ZOFRAN ) injection 4 mg (4 mg Intravenous Given 01/04/24 1154)                                    Medical Decision Making 68 year old female with past medical history of diabetes and hypertension presenting to the emergency department today with nausea, vomiting, and elevated blood glucose.  I will further evaluate the patient here with basic labs including LFTs and a lipase to evaluate for hepatobiliary pathology or pancreatitis.  Will obtain a urinalysis in addition to this to evaluate for DKA.  Given lower blood sugars than is typically seen suspicion for DKA is relatively low.  I will give patient IV fluids.  Will give her Zofran .  Will obtain EKG to screen for cardiac etiology or ACS although suspicion for this is relatively low given no complaints of chest pain or shortness of breath.  I will reevaluate for ultimate disposition.  Her abdominal exam is reassuring and at this time with this being a recurring issue will  hold off on abdominal imaging at this time.  The patient does have leukocytosis but is denying any abdominal pain.  Symptoms improved after IV fluids and nausea medication.  There is a problem with our lab and CMP is not back.  Plan is for reassessment after CMP is back for ultimate disposition.  Vital signs are stable so I think that if she is able to tolerate p.o. with no signs of DKA or other concerning electrolyte abnormalities that she could potentially be discharged.  Amount and/or Complexity of Data Reviewed Labs: ordered.  Risk Prescription drug management.        Final diagnoses:  Nausea and vomiting, unspecified vomiting type    ED Discharge Orders     None          [place]iana Gamero,  Prentice SAUNDERS, MD 01/04/24 [phone]

## 2024-01-04 NOTE — ED Provider Notes (Signed)
 Patient here with some nausea high blood sugar.  She is handed off to me at 4 PM awaiting a CMP.  Initial plan was to get a CT of the abdomen pelvis which she is greatly improved.  Not having any abdominal pain.  She is tolerating p.o.  Her lab work came back with a potassium of 2.9 but otherwise labs were unremarkable.  Blood sugars 202.  Creatinine 1.  Ultimately suspect may be gastroparesis type process but she is feeling better.  I have no concern for infectious process.  I have no concern for intra-abdominal process.  Shared decision was made to hold off on any CT imaging.  Patient to prescribe Zofran  potassium and discharged.  Follow-up with primary care.  Return if symptoms worsen.  This chart was dictated using voice recognition software.  Despite best efforts to proofread,  errors can occur which can change the documentation meaning.    Ruthe Cornet, DO 01/04/24 1908

## 2024-01-04 NOTE — ED Triage Notes (Signed)
 Pt to ED via triage c/o emesis that started yesterday, concerned she is dehydrated. Reports hx of DM, last CBG 237.  History of the same, felt better after receiving fluids.

## 2024-01-06 DIAGNOSIS — R944 Abnormal results of kidney function studies: Secondary | ICD-10-CM | POA: Diagnosis not present

## 2024-01-06 DIAGNOSIS — Z7984 Long term (current) use of oral hypoglycemic drugs: Secondary | ICD-10-CM | POA: Diagnosis not present

## 2024-01-06 DIAGNOSIS — I10 Essential (primary) hypertension: Secondary | ICD-10-CM | POA: Diagnosis not present

## 2024-01-06 DIAGNOSIS — E1165 Type 2 diabetes mellitus with hyperglycemia: Secondary | ICD-10-CM | POA: Diagnosis not present

## 2024-01-06 DIAGNOSIS — Z87891 Personal history of nicotine dependence: Secondary | ICD-10-CM | POA: Diagnosis not present

## 2024-04-17 DIAGNOSIS — E7800 Pure hypercholesterolemia, unspecified: Secondary | ICD-10-CM | POA: Diagnosis not present

## 2024-04-17 DIAGNOSIS — Z1331 Encounter for screening for depression: Secondary | ICD-10-CM | POA: Diagnosis not present

## 2024-04-17 DIAGNOSIS — E1136 Type 2 diabetes mellitus with diabetic cataract: Secondary | ICD-10-CM | POA: Diagnosis not present

## 2024-04-17 DIAGNOSIS — Z0000 Encounter for general adult medical examination without abnormal findings: Secondary | ICD-10-CM | POA: Diagnosis not present

## 2024-04-17 DIAGNOSIS — Z23 Encounter for immunization: Secondary | ICD-10-CM | POA: Diagnosis not present

## 2024-04-17 DIAGNOSIS — E890 Postprocedural hypothyroidism: Secondary | ICD-10-CM | POA: Diagnosis not present

## 2024-04-17 DIAGNOSIS — Z85850 Personal history of malignant neoplasm of thyroid: Secondary | ICD-10-CM | POA: Diagnosis not present

## 2024-04-17 DIAGNOSIS — E039 Hypothyroidism, unspecified: Secondary | ICD-10-CM | POA: Diagnosis not present

## 2024-04-17 DIAGNOSIS — I10 Essential (primary) hypertension: Secondary | ICD-10-CM | POA: Diagnosis not present

## 2024-04-17 DIAGNOSIS — Z5181 Encounter for therapeutic drug level monitoring: Secondary | ICD-10-CM | POA: Diagnosis not present

## 2024-05-09 DIAGNOSIS — Z85850 Personal history of malignant neoplasm of thyroid: Secondary | ICD-10-CM | POA: Diagnosis not present

## 2024-05-09 DIAGNOSIS — E890 Postprocedural hypothyroidism: Secondary | ICD-10-CM | POA: Diagnosis not present

## 2024-05-14 DIAGNOSIS — Z85850 Personal history of malignant neoplasm of thyroid: Secondary | ICD-10-CM | POA: Diagnosis not present

## 2024-05-14 DIAGNOSIS — E890 Postprocedural hypothyroidism: Secondary | ICD-10-CM | POA: Diagnosis not present

## 2024-05-29 DIAGNOSIS — H10413 Chronic giant papillary conjunctivitis, bilateral: Secondary | ICD-10-CM | POA: Diagnosis not present

## 2024-05-29 DIAGNOSIS — H40013 Open angle with borderline findings, low risk, bilateral: Secondary | ICD-10-CM | POA: Diagnosis not present

## 2024-05-29 DIAGNOSIS — E119 Type 2 diabetes mellitus without complications: Secondary | ICD-10-CM | POA: Diagnosis not present

## 2024-05-29 DIAGNOSIS — H5203 Hypermetropia, bilateral: Secondary | ICD-10-CM | POA: Diagnosis not present

## 2024-05-29 DIAGNOSIS — H52203 Unspecified astigmatism, bilateral: Secondary | ICD-10-CM | POA: Diagnosis not present

## 2024-05-29 DIAGNOSIS — H524 Presbyopia: Secondary | ICD-10-CM | POA: Diagnosis not present

## 2024-05-29 DIAGNOSIS — H25813 Combined forms of age-related cataract, bilateral: Secondary | ICD-10-CM | POA: Diagnosis not present
